# Patient Record
Sex: Female | Born: 1965 | Race: White | Hispanic: No | Marital: Single | Smoking: Never smoker
Health system: Southern US, Community
[De-identification: ages and names within clinical notes are randomized; demographics above are authoritative.]

## PROBLEM LIST (undated history)

## (undated) DIAGNOSIS — K9041 Non-celiac gluten sensitivity: Secondary | ICD-10-CM

## (undated) DIAGNOSIS — M1990 Unspecified osteoarthritis, unspecified site: Secondary | ICD-10-CM

## (undated) DIAGNOSIS — T7840XA Allergy, unspecified, initial encounter: Secondary | ICD-10-CM

## (undated) DIAGNOSIS — J302 Other seasonal allergic rhinitis: Secondary | ICD-10-CM

## (undated) DIAGNOSIS — F5104 Psychophysiologic insomnia: Secondary | ICD-10-CM

## (undated) DIAGNOSIS — M5126 Other intervertebral disc displacement, lumbar region: Secondary | ICD-10-CM

## (undated) DIAGNOSIS — F419 Anxiety disorder, unspecified: Secondary | ICD-10-CM

## (undated) DIAGNOSIS — K219 Gastro-esophageal reflux disease without esophagitis: Secondary | ICD-10-CM

## (undated) DIAGNOSIS — L910 Hypertrophic scar: Secondary | ICD-10-CM

## (undated) DIAGNOSIS — F329 Major depressive disorder, single episode, unspecified: Secondary | ICD-10-CM

## (undated) DIAGNOSIS — F32A Depression, unspecified: Secondary | ICD-10-CM

## (undated) DIAGNOSIS — S83519A Sprain of anterior cruciate ligament of unspecified knee, initial encounter: Secondary | ICD-10-CM

## (undated) DIAGNOSIS — J189 Pneumonia, unspecified organism: Secondary | ICD-10-CM

## (undated) DIAGNOSIS — G479 Sleep disorder, unspecified: Secondary | ICD-10-CM

## (undated) DIAGNOSIS — H409 Unspecified glaucoma: Secondary | ICD-10-CM

## (undated) HISTORY — DX: Sleep disorder, unspecified: G47.9

## (undated) HISTORY — PX: EYE SURGERY: SHX253

## (undated) HISTORY — DX: Unspecified osteoarthritis, unspecified site: M19.90

## (undated) HISTORY — DX: Other seasonal allergic rhinitis: J30.2

## (undated) HISTORY — DX: Non-celiac gluten sensitivity: K90.41

## (undated) HISTORY — DX: Psychophysiologic insomnia: F51.04

## (undated) HISTORY — DX: Allergy, unspecified, initial encounter: T78.40XA

## (undated) HISTORY — DX: Hypertrophic scar: L91.0

## (undated) HISTORY — DX: Other intervertebral disc displacement, lumbar region: M51.26

## (undated) HISTORY — DX: Depression, unspecified: F32.A

## (undated) HISTORY — DX: Gastro-esophageal reflux disease without esophagitis: K21.9

## (undated) HISTORY — DX: Unspecified glaucoma: H40.9

## (undated) HISTORY — DX: Anxiety disorder, unspecified: F41.9

## (undated) HISTORY — DX: Major depressive disorder, single episode, unspecified: F32.9

---

## 1993-04-26 HISTORY — PX: KNEE ARTHROSCOPY: SUR90

## 2000-12-02 ENCOUNTER — Other Ambulatory Visit: Admission: RE | Admit: 2000-12-02 | Discharge: 2000-12-02 | Payer: Self-pay | Admitting: Obstetrics and Gynecology

## 2001-12-04 ENCOUNTER — Other Ambulatory Visit: Admission: RE | Admit: 2001-12-04 | Discharge: 2001-12-04 | Payer: Self-pay | Admitting: Obstetrics and Gynecology

## 2002-01-15 ENCOUNTER — Encounter: Admission: RE | Admit: 2002-01-15 | Discharge: 2002-01-15 | Payer: Self-pay | Admitting: Obstetrics and Gynecology

## 2002-01-15 ENCOUNTER — Encounter: Payer: Self-pay | Admitting: Obstetrics and Gynecology

## 2003-02-27 ENCOUNTER — Other Ambulatory Visit: Admission: RE | Admit: 2003-02-27 | Discharge: 2003-02-27 | Payer: Self-pay | Admitting: Obstetrics and Gynecology

## 2004-02-18 ENCOUNTER — Encounter: Admission: RE | Admit: 2004-02-18 | Discharge: 2004-02-18 | Payer: Self-pay | Admitting: Gastroenterology

## 2004-03-24 ENCOUNTER — Encounter: Admission: RE | Admit: 2004-03-24 | Discharge: 2004-03-24 | Payer: Self-pay | Admitting: Gastroenterology

## 2004-06-29 ENCOUNTER — Ambulatory Visit (HOSPITAL_COMMUNITY): Admission: RE | Admit: 2004-06-29 | Discharge: 2004-06-29 | Payer: Self-pay | Admitting: Gastroenterology

## 2004-07-29 ENCOUNTER — Encounter (INDEPENDENT_AMBULATORY_CARE_PROVIDER_SITE_OTHER): Payer: Self-pay | Admitting: *Deleted

## 2004-07-29 ENCOUNTER — Ambulatory Visit (HOSPITAL_COMMUNITY): Admission: RE | Admit: 2004-07-29 | Discharge: 2004-07-29 | Payer: Self-pay | Admitting: Gastroenterology

## 2004-12-15 ENCOUNTER — Ambulatory Visit: Payer: Self-pay | Admitting: Internal Medicine

## 2006-02-23 ENCOUNTER — Encounter: Admission: RE | Admit: 2006-02-23 | Discharge: 2006-02-23 | Payer: Self-pay | Admitting: Obstetrics and Gynecology

## 2006-03-10 ENCOUNTER — Encounter: Admission: RE | Admit: 2006-03-10 | Discharge: 2006-03-10 | Payer: Self-pay | Admitting: Obstetrics and Gynecology

## 2006-04-26 HISTORY — PX: UPPER GASTROINTESTINAL ENDOSCOPY: SHX188

## 2006-06-27 ENCOUNTER — Ambulatory Visit: Payer: Self-pay | Admitting: Internal Medicine

## 2006-08-18 ENCOUNTER — Ambulatory Visit (HOSPITAL_COMMUNITY): Admission: RE | Admit: 2006-08-18 | Discharge: 2006-08-18 | Payer: Self-pay | Admitting: Gastroenterology

## 2006-08-18 ENCOUNTER — Encounter (INDEPENDENT_AMBULATORY_CARE_PROVIDER_SITE_OTHER): Payer: Self-pay | Admitting: Specialist

## 2006-08-29 ENCOUNTER — Ambulatory Visit (HOSPITAL_COMMUNITY): Admission: RE | Admit: 2006-08-29 | Discharge: 2006-08-29 | Payer: Self-pay | Admitting: Gastroenterology

## 2007-01-17 ENCOUNTER — Encounter: Admission: RE | Admit: 2007-01-17 | Discharge: 2007-01-17 | Payer: Self-pay | Admitting: Obstetrics and Gynecology

## 2007-04-27 HISTORY — PX: UPPER GASTROINTESTINAL ENDOSCOPY: SHX188

## 2007-06-01 ENCOUNTER — Telehealth (INDEPENDENT_AMBULATORY_CARE_PROVIDER_SITE_OTHER): Payer: Self-pay | Admitting: *Deleted

## 2007-06-06 ENCOUNTER — Encounter: Admission: RE | Admit: 2007-06-06 | Discharge: 2007-06-06 | Payer: Self-pay | Admitting: Gastroenterology

## 2007-06-13 ENCOUNTER — Telehealth (INDEPENDENT_AMBULATORY_CARE_PROVIDER_SITE_OTHER): Payer: Self-pay | Admitting: *Deleted

## 2007-07-03 ENCOUNTER — Encounter: Admission: RE | Admit: 2007-07-03 | Discharge: 2007-07-03 | Payer: Self-pay | Admitting: Obstetrics and Gynecology

## 2007-07-17 ENCOUNTER — Encounter: Admission: RE | Admit: 2007-07-17 | Discharge: 2007-07-17 | Payer: Self-pay | Admitting: Obstetrics and Gynecology

## 2007-08-28 ENCOUNTER — Telehealth (INDEPENDENT_AMBULATORY_CARE_PROVIDER_SITE_OTHER): Payer: Self-pay | Admitting: *Deleted

## 2007-08-29 DIAGNOSIS — H16299 Other keratoconjunctivitis, unspecified eye: Secondary | ICD-10-CM | POA: Insufficient documentation

## 2007-08-29 DIAGNOSIS — I059 Rheumatic mitral valve disease, unspecified: Secondary | ICD-10-CM | POA: Insufficient documentation

## 2007-08-29 DIAGNOSIS — F329 Major depressive disorder, single episode, unspecified: Secondary | ICD-10-CM | POA: Insufficient documentation

## 2007-08-30 ENCOUNTER — Encounter: Payer: Self-pay | Admitting: Internal Medicine

## 2007-10-09 ENCOUNTER — Ambulatory Visit: Payer: Self-pay | Admitting: Internal Medicine

## 2007-10-09 DIAGNOSIS — K319 Disease of stomach and duodenum, unspecified: Secondary | ICD-10-CM | POA: Insufficient documentation

## 2007-10-09 DIAGNOSIS — G479 Sleep disorder, unspecified: Secondary | ICD-10-CM | POA: Insufficient documentation

## 2007-10-09 DIAGNOSIS — F411 Generalized anxiety disorder: Secondary | ICD-10-CM | POA: Insufficient documentation

## 2007-10-10 LAB — CONVERTED CEMR LAB
Free T4: 0.8 ng/dL (ref 0.6–1.6)
TSH: 2.35 microintl units/mL (ref 0.35–5.50)

## 2007-11-02 ENCOUNTER — Encounter: Payer: Self-pay | Admitting: Internal Medicine

## 2007-11-15 ENCOUNTER — Encounter: Payer: Self-pay | Admitting: Internal Medicine

## 2008-02-09 ENCOUNTER — Telehealth (INDEPENDENT_AMBULATORY_CARE_PROVIDER_SITE_OTHER): Payer: Self-pay | Admitting: *Deleted

## 2008-02-19 ENCOUNTER — Encounter: Admission: RE | Admit: 2008-02-19 | Discharge: 2008-02-19 | Payer: Self-pay | Admitting: Obstetrics and Gynecology

## 2008-03-05 ENCOUNTER — Encounter: Payer: Self-pay | Admitting: Internal Medicine

## 2008-03-29 ENCOUNTER — Ambulatory Visit: Payer: Self-pay | Admitting: Internal Medicine

## 2008-04-08 ENCOUNTER — Encounter (INDEPENDENT_AMBULATORY_CARE_PROVIDER_SITE_OTHER): Payer: Self-pay | Admitting: *Deleted

## 2008-12-23 ENCOUNTER — Encounter: Admission: RE | Admit: 2008-12-23 | Discharge: 2008-12-23 | Payer: Self-pay | Admitting: Obstetrics and Gynecology

## 2009-04-04 ENCOUNTER — Ambulatory Visit: Payer: Self-pay | Admitting: Family

## 2009-04-04 DIAGNOSIS — R5381 Other malaise: Secondary | ICD-10-CM | POA: Insufficient documentation

## 2009-04-04 DIAGNOSIS — J302 Other seasonal allergic rhinitis: Secondary | ICD-10-CM | POA: Insufficient documentation

## 2009-04-04 DIAGNOSIS — J329 Chronic sinusitis, unspecified: Secondary | ICD-10-CM | POA: Insufficient documentation

## 2009-04-04 DIAGNOSIS — R5383 Other fatigue: Secondary | ICD-10-CM | POA: Insufficient documentation

## 2009-04-04 LAB — CONVERTED CEMR LAB
Basophils Absolute: 0 10*3/uL (ref 0.0–0.1)
Basophils Relative: 0.8 % (ref 0.0–3.0)
Eosinophils Absolute: 0.2 10*3/uL (ref 0.0–0.7)
Eosinophils Relative: 4.4 % (ref 0.0–5.0)
HCT: 43.9 % (ref 36.0–46.0)
Hemoglobin: 14.4 g/dL (ref 12.0–15.0)
Lymphocytes Relative: 24.4 % (ref 12.0–46.0)
Lymphs Abs: 1.4 10*3/uL (ref 0.7–4.0)
MCHC: 32.7 g/dL (ref 30.0–36.0)
MCV: 95.5 fL (ref 78.0–100.0)
Monocytes Absolute: 0.4 10*3/uL (ref 0.1–1.0)
Monocytes Relative: 6.8 % (ref 3.0–12.0)
Neutro Abs: 3.6 10*3/uL (ref 1.4–7.7)
Neutrophils Relative %: 63.6 % (ref 43.0–77.0)
Platelets: 188 10*3/uL (ref 150.0–400.0)
RBC: 4.6 M/uL (ref 3.87–5.11)
RDW: 12.3 % (ref 11.5–14.6)
TSH: 1.11 microintl units/mL (ref 0.35–5.50)
WBC: 5.6 10*3/uL (ref 4.5–10.5)

## 2009-04-07 ENCOUNTER — Encounter: Payer: Self-pay | Admitting: Family

## 2009-04-26 DIAGNOSIS — K9041 Non-celiac gluten sensitivity: Secondary | ICD-10-CM

## 2009-04-26 HISTORY — DX: Non-celiac gluten sensitivity: K90.41

## 2010-01-29 ENCOUNTER — Encounter: Admission: RE | Admit: 2010-01-29 | Discharge: 2010-01-29 | Payer: Self-pay | Admitting: Obstetrics and Gynecology

## 2010-05-17 ENCOUNTER — Encounter: Payer: Self-pay | Admitting: Obstetrics and Gynecology

## 2010-05-24 LAB — CONVERTED CEMR LAB
ALT: 19 units/L (ref 0–35)
AST: 25 units/L (ref 0–37)
Albumin: 4 g/dL (ref 3.5–5.2)
Alkaline Phosphatase: 44 units/L (ref 39–117)
BUN: 11 mg/dL (ref 6–23)
Basophils Absolute: 0 10*3/uL (ref 0.0–0.1)
Basophils Relative: 0.6 % (ref 0.0–3.0)
Bilirubin, Direct: 0.1 mg/dL (ref 0.0–0.3)
CO2: 30 meq/L (ref 19–32)
Calcium: 9 mg/dL (ref 8.4–10.5)
Chloride: 106 meq/L (ref 96–112)
Cholesterol: 179 mg/dL (ref 0–200)
Creatinine, Ser: 0.8 mg/dL (ref 0.4–1.2)
Eosinophils Absolute: 0.1 10*3/uL (ref 0.0–0.7)
Eosinophils Relative: 3.7 % (ref 0.0–5.0)
GFR calc Af Amer: 102 mL/min
GFR calc non Af Amer: 84 mL/min
Glucose, Bld: 82 mg/dL (ref 70–99)
HCT: 42.3 % (ref 36.0–46.0)
HDL: 70.6 mg/dL (ref >39.0)
Hemoglobin: 14.6 g/dL (ref 12.0–15.0)
LDL Cholesterol: 102 mg/dL — ABNORMAL HIGH (ref 0–99)
Lymphocytes Relative: 40.7 % (ref 12.0–46.0)
MCHC: 34.5 g/dL (ref 30.0–36.0)
MCV: 91 fL (ref 78.0–100.0)
Monocytes Absolute: 0.4 10*3/uL (ref 0.1–1.0)
Monocytes Relative: 9.4 % (ref 3.0–12.0)
Neutro Abs: 1.8 10*3/uL (ref 1.4–7.7)
Neutrophils Relative %: 45.6 % (ref 43.0–77.0)
Platelets: 190 10*3/uL (ref 150–400)
Potassium: 4.5 meq/L (ref 3.5–5.1)
RBC: 4.65 M/uL (ref 3.87–5.11)
RDW: 12 % (ref 11.5–14.6)
Sodium: 140 meq/L (ref 135–145)
TSH: 1.09 microintl units/mL (ref 0.35–5.50)
Total Bilirubin: 0.9 mg/dL (ref 0.3–1.2)
Total CHOL/HDL Ratio: 2.5
Total Protein: 6.8 g/dL (ref 6.0–8.3)
Triglycerides: 31 mg/dL (ref 0–149)
VLDL: 6 mg/dL (ref 0–40)
WBC: 3.8 10*3/uL — ABNORMAL LOW (ref 4.5–10.5)

## 2010-07-28 IMAGING — MG MM DIAGNOSTIC UNILATERAL L
2 series · 2 of 2 positions shown · non-contrast
Comparison: 02/23/2006, 01/17/2007 and 07/03/2007

CLINICAL DATA: The patient returns for short interval follow-up of
a possible fibroadenoma in the left breast noted on prior study
dated [DATE] and 07/17/2007.

DIGITAL DIAGNOSTIC  LEFT  MAMMOGRAM  WITH CAD AND LEFT BREAST
ULTRASOUND:

[L CC]
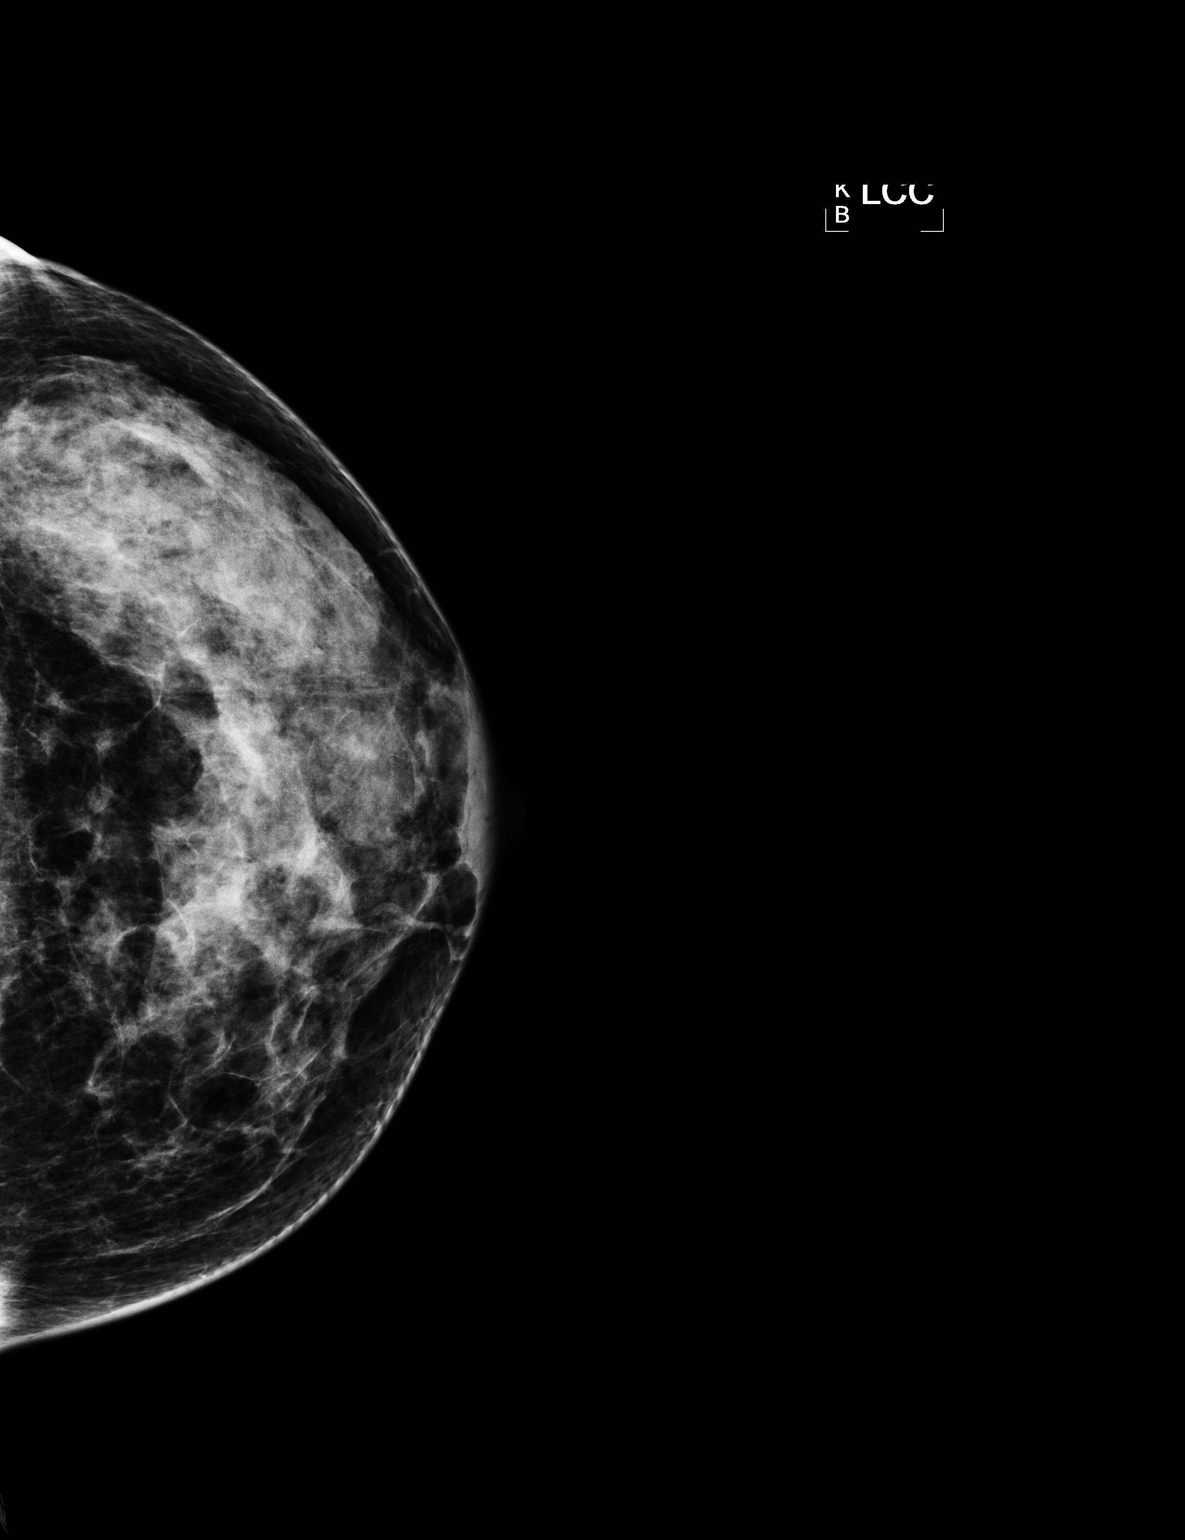

[L MLO]
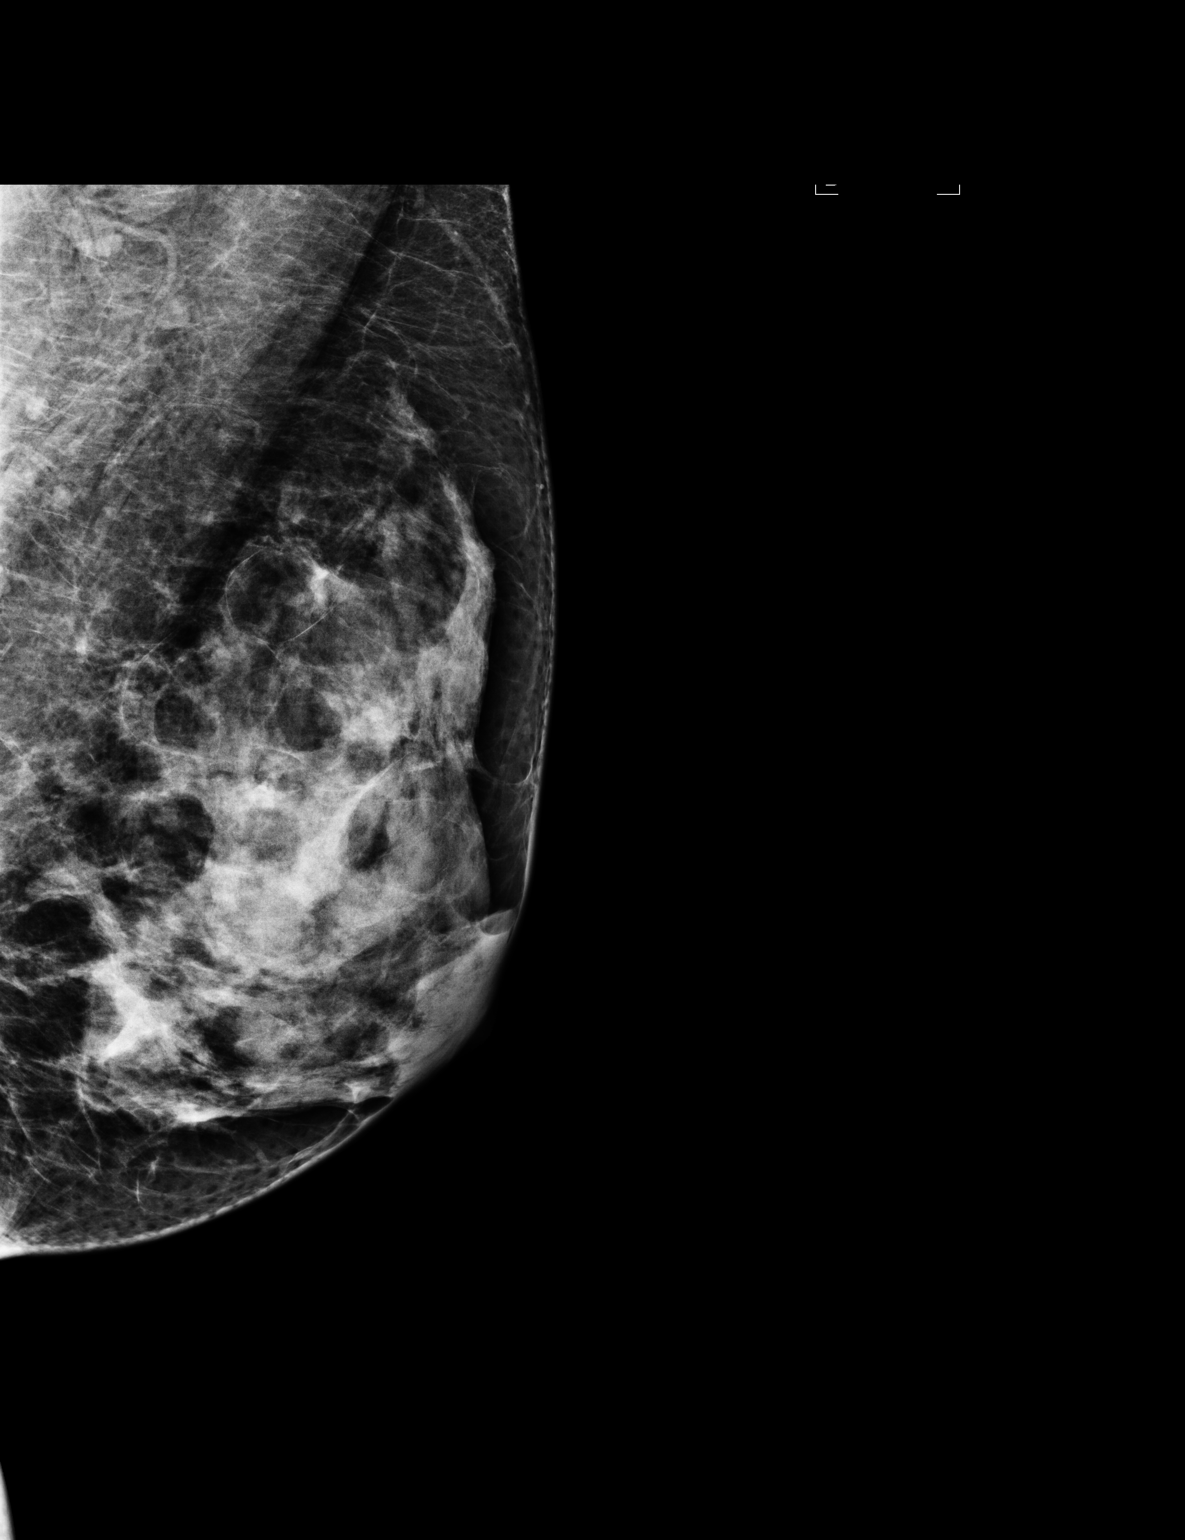

[2 of 2 positions shown; findings below may reference images not displayed]

FINDINGS: The breast tissue is heterogeneously dense.  There is no
dominant mass, architectural distortion or calcification to suggest
malignancy.  No persistent mass is noted in the left upper inner
quadrant.

On physical exam, no mass is palpated in the left upper inner
quadrant.

Ultrasound is performed, showing mixed fibroglandular tissue and
fat with no mass, distortion or shadowing to suggest malignancy.
The area of felt to represent a fibroadenoma prior study is felt to
be a fatty lobule on today's exam.
IMPRESSION: No mammographic or sonographic evidence malignancy.  Yearly
screening mammography is suggested with next scheduled exam in
June 2008.

BI-RADS CATEGORY 1:  Negative.

## 2010-09-11 NOTE — Op Note (Signed)
NAME:  Susan Fisher, Susan Fisher                  ACCOUNT NO.:  [id number]   MEDICAL RECORD NO.:  [id number]          PATIENT TYPE:  AMB   LOCATION:  ENDO                         FACILITY:  [facility]   PHYSICIAN:  Petra Kuba, M.D.    DATE OF BIRTH:  [birth year]   DATE OF PROCEDURE:  08/18/2006  DATE OF DISCHARGE:  08/18/2006                               OPERATIVE REPORT   HISTORY:  The patient had a Bravo capsule placed at the time of her  endoscopy in the customary fashion, and the computerized generated  report as well as her symptom index was given to me for my  interpretation.   ASSESSMENT:  Her DeMeester scores were markedly elevated, both on day 1,  day 2 and total, all in the 30s, normal being 14.7 or less.  She seemed  to reflux more in the supine than the upright on day 1, but  interestingly on day 2 she had more upright than supine.  She belched  frequently throughout the procedure but that did not coordinate with her  reflux.  On a few meals, she did seem to reflux particularly afterwards  but not on everyone.  Her heartburn did seem to coordinate a little more  with reflux than did her belching.   ASSESSMENT:  Obvious reflux disease although were hard to coordinate all  symptoms.   PLAN:  Will maximize her reflux medicine, see back in a month or two and  then decide further workup plans or even surgical options, although my  fear would be her belching and bloating would continue if she was unable  to release her gas based on her frequency of the belching and if her GE  junction was tightened possibly would bother her belly more.           ______________________________  Petra Kuba, M.D.     MEM/MEDQ  D:  08/31/2006  T:  08/31/2006  Job:  [id number]

## 2010-09-11 NOTE — Op Note (Signed)
Susan Fisher, Susan Fisher                  ACCOUNT NO.:  [id number]   MEDICAL RECORD NO.:  [id number]          PATIENT TYPE:  AMB   LOCATION:  ENDO                         FACILITY:  [facility]   PHYSICIAN:  Petra Kuba, M.D.    DATE OF BIRTH:  [birth year]   DATE OF PROCEDURE:  07/29/2004  DATE OF DISCHARGE:                                 OPERATIVE REPORT   PROCEDURES:  Esophagogastroduodenoscopy with biopsy.   INDICATIONS:  Increased belching not responding to the usual medicines.  Consent was signed after the risks, benefits, methods and options were  thoroughly discussed in the office.   MEDICINES USED:  1.  Demerol 50 mg.  2.  Versed 5 mg.   DESCRIPTION OF PROCEDURE:  The video endoscope was inserted by direct  vision.  The esophagus was normal.  She did seem to have a widely patent GE  junction and a possible tiny hiatal hernia.  The scope was passed into the  stomach and advanced to the antrum where she mild antritis and maybe one  small erosion was seen.  The scope was passed through a normal pylorus into  a normal duodenal bulb and around the celiac to a normal second portion of  the duodenum.  The scope was withdrawn back to the bulb and a good look  there ruled out ulcers in that location.  The scope was withdrawn back to  the stomach and retroflexed.  The cardia, fundus, angularis and lesser and  greater curves were normal on retroflexed visualization.  Straight  visualization confirmed the antritis and ruled out any additional findings.  I went ahead and took a few biopsies of the antrum and two of the proximal  stomach to rule out Helicobacter.  The air was suctioned.  The scope was  slowly withdrawn.  Again a good look at the esophagus was normal.  A few  biopsies of the distal and mid esophagus were obtained to rule out any  microscopic reflux changes.  The scope was removed.  The patient tolerated  the procedure well.  There was no evidence of immediate complication.   ENDOSCOPIC DIAGNOSES:  1.  Patent gastroesophageal junction and questionable tiny hiatal hernia,      status post esophageal biopsy to rule out microscopic reflux changes.  2.  Mild antritis and rare antral erosions, status post biopsy.  3.  Otherwise normal esophagogastroduodenoscopy.   PLAN:  Await pathology.  Possibly try her on Reglan if she does not have  Helicobacter.  Further workup and plans at that juncture.      MEM/MEDQ  D:  07/29/2004  T:  07/29/2004  Job:  [id number]

## 2010-09-11 NOTE — Op Note (Signed)
NAMEWILNA, PENNIE                  ACCOUNT NO.:  [id number]   MEDICAL RECORD NO.:  [id number]          PATIENT TYPE:  AMB   LOCATION:  ENDO                         [facility]ACILITY:  [facility]   PHYSICIAN:  Petra Kuba, M.D.    DATE OF BIRTH:  10/07/1965   DATE OF PROCEDURE:  09/05/2006  DATE OF DISCHARGE:  08/29/2006                               OPERATIVE REPORT   PROCEDURE:  Manometry.   INDICATION:  Patient with reflux want to see if surgical candidate.  Consent was signed after risks, benefits, methods, options thoroughly  discussed in the office.   PROCEDURE:  The manometry was done by the endoscopy nurse in the  customary fashion and the computer generated report given to me for my  interpretation.   ASSESSMENT:  1. Upper esophageal sphincter had normal pressures, normal duration      and normal residual pressure she did have a slightly increased peak      pressure of 195 with normal being up to 190 and the pharyngeal      area.  2. Esophageal body.      a.     Upper had normal amplitude, duration and wave.      b.     Lower had normal amplitudes, waveform, velocity, and 100%       peristalsis except for one midamplitude slight increase in       pressure 187 versus normal of 180.  3. Lower esophageal sphincter had a much increased pressure which was      99 normal being up to 45.  Her percent relaxation was 89% and had      normal residual pressures.   ASSESSMENT:  Increased lower esophageal sphincter pressure otherwise  essentially normal manometry.   PLANS:  Would not offer surgery at this time secondary to the increased  pressure, however, might need to recheck the LES pressure in the future  and will see back in one to two months to recheck symptoms and decide  any other workup and plans.           ______________________________  Petra Kuba, M.D.     MEM/MEDQ  D:  09/05/2006  T:  09/05/2006  Job:  [id number]

## 2010-09-11 NOTE — Op Note (Signed)
Susan Fisher, Susan Fisher                  ACCOUNT NO.:  [id number]   MEDICAL RECORD NO.:  [id number]          PATIENT TYPE:  AMB   LOCATION:  ENDO                         FACILITY:  [facility]   PHYSICIAN:  Petra Kuba, M.D.    DATE OF BIRTH:  [birth year]   DATE OF PROCEDURE:  08/18/2006  DATE OF DISCHARGE:                               OPERATIVE REPORT   PROCEDURE:  Esophagogastroduodenoscopy with biopsy and Bravo placement.  Consent was signed after risks, benefits, methods, and options were  thoroughly discussed in the office.   INDICATIONS:  Reflux and multiple GI symptoms not responding to the  usual medicines.   MEDICINES USED:  Fentanyl 100 mcg, Versed 10 mg.   DESCRIPTION OF PROCEDURE:  The video endoscope was inserted by direct  vision.  The esophagus was normal.  Possibly she had a tiny hiatal  hernia versus just loose LES.  The scope was passed into the stomach and  advanced through a normal antrum, normal pylorus, and advanced into a  normal duodenal bulb and around the C-loop to a normal second portion of  the duodenum. Because of gas and bloating, we went ahead and took two  duodenal biopsies.  The scope was withdrawn back to the bulb and a good  look there ruled out abnormalities in that location.  The scope was  withdrawn back to the stomach and retroflexed.  The cardia, fundus,  angularis, lesser and greater curve were all normal on retroflex  visualization.  Straight visualization of the stomach did not reveal any  additional findings.  We went ahead and took two biopsies of the antrum  and two of the proximal stomach to rule out any microscopic abnormality.  The scope was then withdrawn back to the upper esophageal sphincter,  again confirming the normal esophagus.  The scope was then advanced to  the GE junction which was measured at 38 cm.  The tape was placed on the  Bravo introducer at 32 cm as the scope was slowly withdrawn.  The Bravo  capsule was inserted in the  customary fashion, advanced to the tape, and  suction was applied for 45 seconds. Then, the release mechanism was  applied in the customary fashion.  The introducer was removed.  The  scope was then reinserted which confirmed the Bravo capsule in the  proper location at roughly 31 cm.  The scope was removed.  The patient  tolerated the procedure well.  There was no obvious immediate  complication.   ENDOSCOPIC DIAGNOSES:  1. Questionable tiny hiatal hernia.  2. Otherwise, normal EGD status post duodenal and stomach biopsies to      rule out microscopic abnormality.  3. Bravo capsule placed at 31 cm in the customary fashion.   PLAN:  Await pathology and Bravo results and then decide follow up,  other medicine options, etc., call me sooner p.r.n.           ______________________________  Petra Kuba, M.D.     MEM/MEDQ  D:  08/18/2006  T:  08/18/2006  Job:  [id number]

## 2010-12-22 ENCOUNTER — Other Ambulatory Visit: Payer: Self-pay | Admitting: Physician Assistant

## 2011-09-14 ENCOUNTER — Other Ambulatory Visit: Payer: Self-pay | Admitting: Obstetrics and Gynecology

## 2011-09-14 DIAGNOSIS — Z1231 Encounter for screening mammogram for malignant neoplasm of breast: Secondary | ICD-10-CM

## 2011-09-28 ENCOUNTER — Ambulatory Visit
Admission: RE | Admit: 2011-09-28 | Discharge: 2011-09-28 | Disposition: A | Discharge status: Home / Self Care | Payer: Private Health Insurance - Indemnity | Source: Ambulatory Visit | Attending: Obstetrics and Gynecology | Admitting: Obstetrics and Gynecology

## 2011-09-28 DIAGNOSIS — Z1231 Encounter for screening mammogram for malignant neoplasm of breast: Secondary | ICD-10-CM

## 2011-12-28 ENCOUNTER — Telehealth: Payer: Self-pay | Admitting: Internal Medicine

## 2011-12-28 NOTE — Telephone Encounter (Signed)
The pt called and is hoping to get labs done before her 2:30pm cpe with Dr.Hopper.  Can she do this? Or does she have to wait?   Thanks!

## 2011-12-28 NOTE — Telephone Encounter (Signed)
Called pt, she understands. Thanks!

## 2011-12-28 NOTE — Telephone Encounter (Signed)
Patient not seen since 2009 by Dr.Hopper, she will need to wait.

## 2011-12-28 NOTE — Telephone Encounter (Signed)
Noted  

## 2012-01-03 ENCOUNTER — Encounter: Payer: Self-pay | Admitting: Internal Medicine

## 2012-01-03 ENCOUNTER — Ambulatory Visit (INDEPENDENT_AMBULATORY_CARE_PROVIDER_SITE_OTHER): Payer: Medicare HMO | Admitting: Internal Medicine

## 2012-01-03 VITALS — BP 110/70 | HR 75 | Temp 98.1°F | Resp 12 | Ht 58.03 in | Wt 106.4 lb

## 2012-01-03 DIAGNOSIS — J302 Other seasonal allergic rhinitis: Secondary | ICD-10-CM

## 2012-01-03 DIAGNOSIS — Z0000 Encounter for general adult medical examination without abnormal findings: Secondary | ICD-10-CM

## 2012-01-03 DIAGNOSIS — J309 Allergic rhinitis, unspecified: Secondary | ICD-10-CM

## 2012-01-03 NOTE — Patient Instructions (Addendum)
Preventive Health Care: Exercise  30-45  minutes a day, 3-4 days a week. Walking is especially valuable in preventing Osteoporosis. Eat a low-fat diet with lots of fruits and vegetables, up to 7-9 servings per day. To prevent palpitations or premature beats, avoid stimulants such as decongestants, diet pills, or caffeine (coffee, tea, cola, or chocolate) to excess.  Consume less than 30 grams  of sugar per day from foods & drinks with High Fructose Corn Syrup as #2,3 or #4 on label. Please  schedule fasting Labs : BMET,Lipids, hepatic panel, CBC & dif, TSH. Codes: V70.0.   If you activate My Chart; the results can be released to you as soon as they populate from the lab. If you choose not to use this program; the labs have to be reviewed, copied & mailed   causing a delay in getting the results to you.

## 2012-01-03 NOTE — Progress Notes (Signed)
  Subjective:    Patient ID: Susan Fisher, female    DOB: 08/23/1965, 46 y.o.   MRN: [id number]   HPI  Susan Fisher is here for a physical; she denies acute issues.      Review of Systems Her chronic insomnia is well controlled for the most part with the present regimen from Dr. Marylou Flesher. Occasionally she gets anxious about being able to sleep and will have some insomnia.  Her seasonal allergies are well controlled with Claritin on an as-needed basis.  She previously had intractable burping; endoscopy was negative x2. Apparently biopsies were made of the small bowel at [facility] which did not reveal sprue. With gluten avoidance this problem has resolved completely.     Objective:   Physical Exam Gen.: Thin but healthy and well-nourished in appearance. Alert, appropriate and cooperative throughout exam. Head: Normocephalic without obvious abnormalities  Eyes: No corneal or conjunctival inflammation noted. Pupils equal round reactive to light and accommodation. Fundal exam is benign without hemorrhages, exudate, papilledema. Extraocular motion intact. Vision grossly normal with lenses. Minimal lid lag.Minimal vertical nystagmus Ears: External  ear exam reveals no significant lesions or deformities. Canals clear .TMs normal. Hearing is grossly normal bilaterally. Nose: External nasal exam reveals no deformity or inflammation. Nasal mucosa are pink and moist. No lesions or exudates noted.   Mouth: Oral mucosa and oropharynx reveal no lesions or exudates. Teeth in good repair. Neck: No deformities, masses, or tenderness noted. Range of motion & Thyroid normal. Lungs: Normal respiratory effort; chest expands symmetrically. Lungs are clear to auscultation without rales, wheezes, or increased work of breathing. Heart: Normal rate and rhythm. Normal S1 and S2. No gallop, click, or rub. S4 with Grade 1/ 6 systolic murmur. Abdomen: Bowel sounds normal; abdomen soft and nontender. No masses, organomegaly or  hernias noted. Genitalia: Dr Cherly Hensen, Gyn Musculoskeletal/extremities: No deformity or scoliosis noted of  the thoracic or lumbar spine. No clubbing, cyanosis, edema, or deformity noted. Range of motion  normal .Tone & strength  normal.Joints normal. Nail health  good. Vascular: Carotid, radial artery, dorsalis pedis and  posterior tibial pulses are full and equal. No bruits present. Neurologic: Alert and oriented x3. Deep tendon reflexes symmetrical and normal.          Skin: Intact without suspicious lesions or rashes. Lymph: No cervical, axillary lymphadenopathy present. Psych: Mood and affect are normal. Normally interactive                                                                                         Assessment & Plan:  #1 comprehensive physical exam; no acute findings #2 see Problem List with Assessments & Recommendations Plan: see Orders

## 2012-01-13 ENCOUNTER — Other Ambulatory Visit: Payer: Medicare HMO

## 2012-01-25 ENCOUNTER — Other Ambulatory Visit: Payer: Medicare HMO

## 2012-02-01 ENCOUNTER — Other Ambulatory Visit (INDEPENDENT_AMBULATORY_CARE_PROVIDER_SITE_OTHER): Payer: Medicare HMO

## 2012-02-01 DIAGNOSIS — Z0000 Encounter for general adult medical examination without abnormal findings: Secondary | ICD-10-CM

## 2012-02-01 DIAGNOSIS — Z79899 Other long term (current) drug therapy: Secondary | ICD-10-CM

## 2012-02-01 LAB — HEPATIC FUNCTION PANEL
AST: 17 U/L (ref 0–37)
Alkaline Phosphatase: 40 U/L (ref 39–117)
Bilirubin, Direct: 0.1 mg/dL (ref 0.0–0.3)

## 2012-02-01 LAB — BASIC METABOLIC PANEL
BUN: 10 mg/dL (ref 6–23)
CO2: 26 mEq/L (ref 19–32)
Calcium: 8.5 mg/dL (ref 8.4–10.5)
Chloride: 106 mEq/L (ref 96–112)
Creatinine, Ser: 0.8 mg/dL (ref 0.4–1.2)

## 2012-02-01 LAB — CBC WITH DIFFERENTIAL/PLATELET
Basophils Absolute: 0 10*3/uL (ref 0.0–0.1)
Basophils Relative: 1 % (ref 0.0–3.0)
Eosinophils Absolute: 0.1 10*3/uL (ref 0.0–0.7)
Hemoglobin: 14 g/dL (ref 12.0–15.0)
Lymphs Abs: 1.5 10*3/uL (ref 0.7–4.0)
MCHC: 32.6 g/dL (ref 30.0–36.0)
MCV: 91.9 fl (ref 78.0–100.0)
Monocytes Absolute: 0.4 10*3/uL (ref 0.1–1.0)
Neutro Abs: 2 10*3/uL (ref 1.4–7.7)
RBC: 4.66 Mil/uL (ref 3.87–5.11)
RDW: 12.2 % (ref 11.5–14.6)

## 2012-02-01 LAB — LIPID PANEL
Cholesterol: 150 mg/dL (ref 0–200)
LDL Cholesterol: 78 mg/dL (ref 0–99)
Total CHOL/HDL Ratio: 2

## 2012-02-14 ENCOUNTER — Telehealth: Payer: Self-pay

## 2012-02-14 NOTE — Telephone Encounter (Signed)
Spoke with patient, form to be faxed

## 2012-02-14 NOTE — Telephone Encounter (Signed)
Pt states never was called to pick up form for employment verifying physical and lab work was done. Pt states received lab results only.  Plz advise    MW

## 2012-07-13 ENCOUNTER — Other Ambulatory Visit: Payer: Self-pay

## 2012-07-13 MED ORDER — AMPHETAMINE-DEXTROAMPHET ER 10 MG PO CP24: 10.0000 mg | ORAL_CAPSULE | Freq: Every day | ORAL | Status: DC

## 2012-07-13 NOTE — Telephone Encounter (Signed)
Dr Vickey Huger is out of the office.  Dr Anne Hahn [facility]

## 2012-09-27 ENCOUNTER — Other Ambulatory Visit: Payer: Self-pay

## 2012-09-27 MED ORDER — AMPHETAMINE-DEXTROAMPHET ER 10 MG PO CP24: 10.0000 mg | ORAL_CAPSULE | Freq: Every day | ORAL | Status: DC

## 2012-09-27 NOTE — Telephone Encounter (Signed)
Patient requesting refill.  Call back number [phone]

## 2012-12-13 ENCOUNTER — Encounter (HOSPITAL_COMMUNITY): Payer: Self-pay | Admitting: *Deleted

## 2012-12-13 ENCOUNTER — Emergency Department (HOSPITAL_COMMUNITY)
Admission: EM | Admit: 2012-12-13 | Discharge: 2012-12-14 | Disposition: A | Discharge status: Home / Self Care | Payer: Medicare HMO | Attending: Emergency Medicine | Admitting: Emergency Medicine

## 2012-12-13 DIAGNOSIS — Z3202 Encounter for pregnancy test, result negative: Secondary | ICD-10-CM | POA: Insufficient documentation

## 2012-12-13 DIAGNOSIS — G479 Sleep disorder, unspecified: Secondary | ICD-10-CM | POA: Insufficient documentation

## 2012-12-13 DIAGNOSIS — Z8719 Personal history of other diseases of the digestive system: Secondary | ICD-10-CM | POA: Insufficient documentation

## 2012-12-13 DIAGNOSIS — N898 Other specified noninflammatory disorders of vagina: Secondary | ICD-10-CM

## 2012-12-13 DIAGNOSIS — R109 Unspecified abdominal pain: Secondary | ICD-10-CM

## 2012-12-13 DIAGNOSIS — Z9889 Other specified postprocedural states: Secondary | ICD-10-CM | POA: Insufficient documentation

## 2012-12-13 DIAGNOSIS — R1031 Right lower quadrant pain: Secondary | ICD-10-CM | POA: Insufficient documentation

## 2012-12-13 NOTE — ED Notes (Signed)
Pt states that she began to have abd pain that began around 6pm; pt states that the pain is a 5-6 all the time with intermittent waves of increased pain; pt denies N/V/D

## 2012-12-14 ENCOUNTER — Emergency Department (HOSPITAL_COMMUNITY): Payer: Medicare HMO

## 2012-12-14 LAB — BASIC METABOLIC PANEL
BUN: 14 mg/dL (ref 6–23)
Calcium: 8.5 mg/dL (ref 8.4–10.5)
Creatinine, Ser: 0.81 mg/dL (ref 0.50–1.10)
GFR calc Af Amer: >90 mL/min (ref >90)
GFR calc non Af Amer: 86 mL/min — ABNORMAL LOW (ref >90)
Potassium: 3.6 mEq/L (ref 3.5–5.1)

## 2012-12-14 LAB — CBC WITH DIFFERENTIAL/PLATELET
Basophils Relative: 0 % (ref 0–1)
Eosinophils Absolute: 0.2 10*3/uL (ref 0.0–0.7)
Eosinophils Relative: 2 % (ref 0–5)
Hemoglobin: 13.7 g/dL (ref 12.0–15.0)
MCH: 30 pg (ref 26.0–34.0)
MCHC: 33.7 g/dL (ref 30.0–36.0)
MCV: 89.1 fL (ref 78.0–100.0)
Monocytes Relative: 8 % (ref 3–12)
Neutrophils Relative %: 69 % (ref 43–77)
Platelets: 206 10*3/uL (ref 150–400)

## 2012-12-14 LAB — URINE MICROSCOPIC-ADD ON

## 2012-12-14 LAB — URINALYSIS, ROUTINE W REFLEX MICROSCOPIC
Nitrite: NEGATIVE
Specific Gravity, Urine: 1.015 (ref 1.005–1.030)
pH: 8 (ref 5.0–8.0)

## 2012-12-14 LAB — GC/CHLAMYDIA PROBE AMP: GC Probe RNA: NEGATIVE

## 2012-12-14 MED ORDER — CEFTRIAXONE SODIUM 250 MG IJ SOLR
250.0000 mg | Freq: Once | INTRAMUSCULAR | Status: AC
  Administered 2012-12-14: 250 mg via INTRAMUSCULAR
  Filled 2012-12-14: qty 250

## 2012-12-14 MED ORDER — AZITHROMYCIN 250 MG PO TABS
1000.0000 mg | ORAL_TABLET | Freq: Once | ORAL | Status: AC
  Administered 2012-12-14: 1000 mg via ORAL
  Filled 2012-12-14: qty 4

## 2012-12-14 MED ORDER — IOHEXOL 300 MG/ML  SOLN
50.0000 mL | Freq: Once | INTRAMUSCULAR | Status: AC | PRN
  Administered 2012-12-14: 50 mL via ORAL

## 2012-12-14 MED ORDER — HYDROCODONE-ACETAMINOPHEN 5-325 MG PO TABS: 1.0000 | ORAL_TABLET | ORAL | Status: DC | PRN

## 2012-12-14 MED ORDER — IOHEXOL 300 MG/ML  SOLN
100.0000 mL | Freq: Once | INTRAMUSCULAR | Status: AC | PRN
  Administered 2012-12-14: 100 mL via INTRAVENOUS

## 2012-12-14 NOTE — ED Provider Notes (Signed)
Medical screening examination/treatment/procedure(s) were performed by non-physician practitioner and as supervising physician I was immediately available for consultation/collaboration.  Danelia Snodgrass, MD 12/14/12 0556 

## 2012-12-14 NOTE — ED Provider Notes (Signed)
CSN: [id number]     Arrival date & time 12/13/12  2338 History     First MD Initiated Contact with Patient 12/14/12 0044     Chief Complaint  Patient presents with  . Abdominal Pain   (Consider location/radiation/quality/duration/timing/severity/associated sxs/prior Treatment) HPI History provided by pt.   Pt presents w/ intermittent RLQ pain since 5-6pm yesterday evening.  Pain severe, sharp and crampy, lasts for at a time, and then returns almost immediately.  Occasionally radiates to R flank.  Feels similar to menstrual cramps but stronger.  Currently improved.  No associated fever, N/V/D, hematochezia/melena, urinary or vaginal sx.  No h/o abdominal surgeries.  Past Medical History  Diagnosis Date  . Sleep disorder     Dr Marylou Flesher  . Gluten intolerance 2011    Dr Harmon Pier   Past Surgical History  Procedure Laterality Date  . Upper gastrointestinal endoscopy  2008    neg; Dr Ewing Schlein  . Upper gastrointestinal endoscopy  2009     [facility]  . Knee arthroscopy  1995   Family History  Problem Relation Age of Onset  . Anxiety disorder Mother   . Depression Mother   . Hypertension Mother   . Stroke Mother 14  . Heart failure Mother   . Heart attack Mother 61  . Breast cancer Mother 67  . COPD Mother     smoker  . Heart attack Paternal Grandfather 72  . Pulmonary embolism Father     post rotator cuff surgery   History  Substance Use Topics  . Smoking status: Never Smoker   . Smokeless tobacco: Not on file  . Alcohol Use: 3.0 oz/week    5 Glasses of wine per week     Comment:  socially   OB History   Grav Para Term Preterm Abortions TAB SAB Ect Mult Living                 Review of Systems  All other systems reviewed and are negative.    Allergies  Gluten meal and Venlafaxine  Home Medications   Current Outpatient Rx  Name  Route  Sig  Dispense  Refill  . ALPRAZolam (XANAX) 0.5 MG tablet   Oral   Take 0.25 mg by mouth at bedtime as needed for  sleep.         Marland Kitchen amphetamine-dextroamphetamine (ADDERALL XR) 10 MG 24 hr capsule   Oral   Take 1 capsule (10 mg total) by mouth daily.   90 capsule   0   . ibuprofen (ADVIL,MOTRIN) 200 MG tablet   Oral   Take 200 mg by mouth every 8 (eight) hours as needed for pain.         Marland Kitchen QUEtiapine (SEROQUEL) 25 MG tablet   Oral   Take 25 mg by mouth at bedtime.         . Simethicone (GAS-X PO)   Oral   Take 2 tablets by mouth once.          BP 153/87  Pulse 77  Temp(Src) 98.3 F (36.8 C) (Oral)  Resp 18  Ht 4\' 10"  (1.473 m)  Wt 110 lb (49.896 kg)  BMI 23 kg/m2  SpO2 100%  LMP 11/20/2012 Physical Exam  Nursing note and vitals reviewed. Constitutional: She is oriented to person, place, and time. She appears well-developed and well-nourished. No distress.  HENT:  Head: Normocephalic and atraumatic.  Eyes:  Normal appearance  Neck: Normal range of motion.  Cardiovascular: Normal rate  and regular rhythm.   Pulmonary/Chest: Effort normal and breath sounds normal. No respiratory distress.  Abdominal: Soft. Bowel sounds are normal. She exhibits no distension and no mass. There is no rebound and no guarding.  Mild RLQ ttp only   Genitourinary:  No CVA tenderness.  Nml external genitalia.  Thick, yellow mucousy vaginal discharge.  Cervix erythematous and there is what appears to be a 1-3mm shallow, darkly-colored ulceration.  No adnexal or cervical motion tenderess.    Musculoskeletal: Normal range of motion.  Neurological: She is alert and oriented to person, place, and time.  Skin: Skin is warm and dry. No rash noted.  Psychiatric: She has a normal mood and affect. Her behavior is normal.    ED Course   Procedures (including critical care time)  Labs Reviewed  WET PREP, GENITAL - Abnormal; Notable for the following:    WBC, Wet Prep HPF POC TOO NUMEROUS TO COUNT (*)    All other components within normal limits  URINALYSIS, ROUTINE W REFLEX MICROSCOPIC - Abnormal;  Notable for the following:    Leukocytes, UA TRACE (*)    All other components within normal limits  BASIC METABOLIC PANEL - Abnormal; Notable for the following:    Glucose, Bld 100 (*)    GFR calc non Af Amer 86 (*)    All other components within normal limits  GC/CHLAMYDIA PROBE AMP  URINE MICROSCOPIC-ADD ON  CBC WITH DIFFERENTIAL  POCT PREGNANCY, URINE   Ct Abdomen Pelvis W Contrast  12/14/2012   *RADIOLOGY REPORT*  Clinical Data: Abdominal pain.  White cells in the urine.  White cell count 7.1.  CT ABDOMEN AND PELVIS WITH CONTRAST  Technique:  Multidetector CT imaging of the abdomen and pelvis was performed following the standard protocol during bolus administration of intravenous contrast.  Contrast: OMNIPAQUE IOHEXOL 300 MG/ML  SOLN  Comparison: None.  Findings: Mild dependent atelectasis in the lung bases.  The liver, spleen, gallbladder, pancreas, adrenal glands, kidneys, abdominal aorta, inferior vena cava, and retroperitoneal lymph nodes are unremarkable.  The stomach and small bowel appear normal. Contrast material flows to the colon without evidence of obstruction.  Stool filled colon without distension.  No free air or free fluid in the abdomen.  Abdominal wall musculature appears intact.  Pelvis:  A the uterus and ovaries are not enlarged.  There appears to be involuting cyst in the right ovary with small amount of free fluid in the pelvis and around the right adnexa.  This is likely physiologic fluid.  The appendix is normal.  No evidence of diverticulitis.  No significant pelvic lymphadenopathy.  Normal alignment of the lumbar spine.  No destructive bone lesions appreciated.  IMPRESSION: Probable involuting follicle in the right ovary with small amount of free fluid in the pelvis, likely physiologic.  Normal appendix. No acute inflammatory process suggested in the abdomen or pelvis.   Original Report Authenticated By: Burman Nieves, M.D.   1. Abdominal pain   2. Vaginal  discharge     MDM  47yo healthy F presents w/ intermittent, severe, RLQ pain w/out associated sx since yesterday evening.  Pain minimal currently.  On exam, afebrile, NAD, abd soft/non-distended, mild RLQ ttp, thick, yellow, vaginal discharge and erythematous cervix w/ 1-62mm shallow ulceration.  Location of pain concerning for appendicitis though sx are otherwise not consistent.   No hematuria to suggest kidney stone.  No adnexal tenderness to suggest TOA/torsion.  Discussed case w/ Dr. Dierdre Highman and he recommends CT abd/pelvis w/  contrast.  Pt ok with this plan.  She will receive zithromax and rocephin for possible cervicitis. 2:54 AM   CT non-acute.  Results discussed w/ pt.  Her pain continues to be minimal and repeat exam unchanged.  D/c'd home w/ vicodin.  Recommended f/u with gynecologist and then her PCP next week if sx are persistent.  Return precautions discussed. 5:04 AM   Otilio Miu, PA-C 12/14/12 [phone]

## 2012-12-19 ENCOUNTER — Other Ambulatory Visit: Payer: Self-pay | Admitting: Neurology

## 2013-02-06 ENCOUNTER — Telehealth: Payer: Self-pay | Admitting: Neurology

## 2013-02-07 ENCOUNTER — Other Ambulatory Visit: Payer: Self-pay

## 2013-02-07 MED ORDER — AMPHETAMINE-DEXTROAMPHET ER 10 MG PO CP24: 10.0000 mg | ORAL_CAPSULE | Freq: Every day | ORAL | Status: DC

## 2013-02-07 NOTE — Telephone Encounter (Signed)
Patient has scheduled an appt.  Rx request was sent to the provider.

## 2013-02-07 NOTE — Telephone Encounter (Signed)
Patient called requesting a refill on Adderall.  She has scheduled an appt in March 2015.  She would like to pick up the Rx when it's ready.  Call back number [phone].

## 2013-02-08 NOTE — Telephone Encounter (Signed)
Rx signed, ready for pick up.  I called the patient.  She is aware.

## 2013-03-01 ENCOUNTER — Other Ambulatory Visit: Payer: Self-pay

## 2013-03-19 ENCOUNTER — Other Ambulatory Visit: Payer: Self-pay | Admitting: Neurology

## 2013-06-18 ENCOUNTER — Other Ambulatory Visit: Payer: Self-pay | Admitting: Neurology

## 2013-06-18 MED ORDER — AMPHETAMINE-DEXTROAMPHET ER 10 MG PO CP24: 10.0000 mg | ORAL_CAPSULE | Freq: Every day | ORAL | Status: DC

## 2013-06-18 NOTE — Telephone Encounter (Signed)
Called patient to inform her that her Rx was ready to be picked up at the front desk and if she has any other problems, questions or concerns to call the office. Patient verbalized understanding. °

## 2013-06-18 NOTE — Telephone Encounter (Signed)
Patient needs written Rx for Amphetamine Salt--please call when ready

## 2013-07-04 ENCOUNTER — Ambulatory Visit: Payer: Self-pay | Admitting: Neurology

## 2013-08-24 HISTORY — PX: ANTERIOR CRUCIATE LIGAMENT REPAIR: SHX115

## 2013-09-14 ENCOUNTER — Encounter: Payer: Self-pay | Admitting: Neurology

## 2013-09-19 ENCOUNTER — Ambulatory Visit (INDEPENDENT_AMBULATORY_CARE_PROVIDER_SITE_OTHER): Payer: Medicare HMO | Admitting: Neurology

## 2013-09-19 ENCOUNTER — Encounter: Payer: Self-pay | Admitting: Neurology

## 2013-09-19 VITALS — BP 116/77 | HR 87 | Resp 16

## 2013-09-19 DIAGNOSIS — F489 Nonpsychotic mental disorder, unspecified: Secondary | ICD-10-CM

## 2013-09-19 DIAGNOSIS — F5105 Insomnia due to other mental disorder: Secondary | ICD-10-CM

## 2013-09-19 MED ORDER — QUETIAPINE FUMARATE 25 MG PO TABS: 25.0000 mg | ORAL_TABLET | Freq: Every day | ORAL | Status: DC

## 2013-09-19 MED ORDER — AMPHETAMINE-DEXTROAMPHET ER 10 MG PO CP24: 10.0000 mg | ORAL_CAPSULE | Freq: Every day | ORAL | Status: DC

## 2013-09-19 NOTE — Progress Notes (Signed)
[facility]  Provider:  Larey Seat, M D  Referring Provider: Hendricks Limes, MD Primary Care Physician:  Unice Cobble, MD  Chief Complaint  Patient presents with  . Follow-up    Room 10  . Insomnia    HPI:  Susan Fisher is a 48 y.o. female  Is seen here as a  prolonged  revisit  from Dr. Linna Darner , follow up for medicine refills to control insomnia. Last visit  10-10-2011.  This patient has a history of excessive daytime sleepiness, chronic insomnia and acid reflux disease. She is treated for depression and Follow up with a therapist. She has started seroquel  to control her insomnia and has been doing wellfor 3 years.  She also had in her last visit reported that her gastrointestinal symptoms resolved after she change to a gluten-free diet.  The patient has nausea history is currently not gainfully employed, she has never used tobacco products she drinks 2 caffeinated beverages per day and up to 4 beverages of alcohol on a week and. She's a Forensic psychologist.  Family medical history her father is deceased from a pulmonary embolism following a surgical procedure. Her mother died of a stroke. Also significant for heart disease comes the COPD and depression in her mother. Her mother had essential tremor as have 2 of her sisters.  I will refill today Seroquel 25 minute 90 day supply and 10 mg tablets of generic Adderall.      Review of Systems: Out of a complete 14 system review, the patient complains of only the following symptoms, and all other reviewed systems are negative. 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  Still in a brace. Orthopedist Dr. [name].  Fatigue severity score today is 29, at the sleepiness score at 5 points these are unusually good results for the patient prior to medication her fatigue score was 51 and her Epworth 12  points.  History   Social History  . Marital Status: Single     Spouse Name: N/A    Number of Children: N/A  . Years of Education: College   Occupational History  .  [employer]   Social History Main Topics  . Smoking status: Never Smoker   . Smokeless tobacco: Never Used  . Alcohol Use: 2.4 oz/week    4 Glasses of wine per week     Comment:  socially  . Drug Use: No  . Sexual Activity: Not on file   Other Topics Concern  . Not on file   Social History Narrative   Patient is single and lives alone.     Patient has a college education.   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   Patient drinks two caffeine drinks daily.   Patient is right-handed.          Family History  Problem Relation Age of Onset  . Anxiety disorder Mother   . Depression Mother   . Hypertension Mother   . Stroke Mother 58  . Heart failure Mother   . Heart attack Mother 26  . Breast cancer Mother 33  . COPD Mother     smoker  . Heart attack Paternal Grandfather 19  . Pulmonary embolism Father     post rotator cuff surgery  . Tremor Sister   . Tremor Sister     Past Medical History  Diagnosis Date  . Sleep disorder     Dr  Dohmier  . Gluten intolerance 2011    Dr Marquette Saa  . Chronic insomnia   . Depression   . Anxiety   . Insomnia   . Excessive daytime sleepiness     Past Surgical History  Procedure Laterality Date  . Upper gastrointestinal endoscopy  2008    neg; Dr Watt Climes  . Upper gastrointestinal endoscopy  2009     [facility]  . Knee arthroscopy  1995    Current Outpatient Prescriptions  Medication Sig Dispense Refill  . ALPRAZolam (XANAX) 0.5 MG tablet Take 0.25 mg by mouth at bedtime as needed for sleep.      Marland Kitchen amphetamine-dextroamphetamine (ADDERALL XR) 10 MG 24 hr capsule Take 1 capsule (10 mg total) by mouth daily.  90 capsule  0  . diazepam (VALIUM) 2 MG tablet as needed.      Marland Kitchen HYDROcodone-acetaminophen (NORCO/VICODIN) 5-325 MG per tablet Take 1 tablet by mouth every 4 (four) hours as needed for pain.  20 tablet  0  .  HYDROmorphone (DILAUDID) 2 MG tablet as needed.      Marland Kitchen ibuprofen (ADVIL,MOTRIN) 200 MG tablet Take 200 mg by mouth every 8 (eight) hours as needed for pain.      Marland Kitchen oxyCODONE (OXY IR/ROXICODONE) 5 MG immediate release tablet as needed.      Marland Kitchen QUEtiapine (SEROQUEL) 25 MG tablet Take 1 tablet (25 mg total) by mouth at bedtime.  90 tablet  3  . traMADol (ULTRAM) 50 MG tablet Hasn't picked up rx yet       No current facility-administered medications for this visit.    Allergies as of 09/19/2013 - Review Complete 09/19/2013  Allergen Reaction Noted  . Gluten meal Other (See Comments) 12/14/2012  . Venlafaxine      Vitals: BP 116/77  Pulse 87  Resp 16  LMP 09/15/2013 Last Weight:  Wt Readings from Last 1 Encounters:  12/13/12 110 lb (49.896 kg)   Last Height:   Ht Readings from Last 1 Encounters:  12/13/12 4\' 10"  (1.473 m)    Physical exam:  General: The patient is awake, alert and appears not in acute distress. The patient is well groomed. Head: Normocephalic, atraumatic. Neck is supple. Mallampati 2 , neck circumference:14  Cardiovascular:  Regular rate and rhythm, without  murmurs or carotid bruit, and without distended neck veins. Respiratory: Lungs are clear to auscultation. Skin:  Without evidence of edema, or rash Trunk: BMI is normal .  Neurologic exam : The patient is awake and alert, oriented to place and time.   Memory subjective described as intact.  There is a normal attention span & concentration ability. Speech is fluent without dysarthria, dysphonia or aphasia.  Mood and affect are appropriate.  Cranial nerves: Pupils are equal and briskly reactive to light. Funduscopic exam without   evidence of pallor or edema. Extraocular movements  in vertical and horizontal planes intact and without nystagmus. Visual fields by finger perimetry are intact. Hearing to finger rub intact.  Facial sensation intact to fine touch. Facial motor strength is symmetric and tongue and  uvula move midline.  Motor exam:  Normal tone and   muscle bulk and symmetric normal strength in all extremities.  Sensory:  Fine touch, pinprick and vibration were tested in all extremities. Proprioception is  normal.  Coordination: Rapid alternating movements in the fingers/hands is tested and normal. Finger-to-nose maneuver tested and normal without evidence of ataxia, dysmetria or tremor.  Gait and station: Patient walks without assistive device , climbs  up to the exam table. Strength within normal limits.  Deep tendon reflexes: in the  upper and lower extremities are symmetric and intact. Babinski maneuver response is  downgoing.   Assessment:  After physical and neurologic examination, review of laboratory studies, imaging, neurophysiology testing and pre-existing records, assessment is  1) chronic insomnia, treated with Seroquel 25 mg nightly.  2) Blood work last done by Dr Linna Darner, normal range. 3) Adderall 10 mg tab daily user.

## 2013-10-08 ENCOUNTER — Other Ambulatory Visit: Payer: Self-pay | Admitting: Neurology

## 2013-10-25 ENCOUNTER — Encounter: Payer: Self-pay | Admitting: Vascular Surgery

## 2013-10-25 ENCOUNTER — Other Ambulatory Visit (HOSPITAL_COMMUNITY): Payer: Self-pay | Admitting: Orthopedic Surgery

## 2013-10-25 ENCOUNTER — Ambulatory Visit (HOSPITAL_COMMUNITY)
Admission: RE | Admit: 2013-10-25 | Discharge: 2013-10-25 | Disposition: A | Discharge status: Home / Self Care | Payer: Medicare HMO | Source: Ambulatory Visit | Attending: Vascular Surgery | Admitting: Vascular Surgery

## 2013-10-25 ENCOUNTER — Ambulatory Visit (INDEPENDENT_AMBULATORY_CARE_PROVIDER_SITE_OTHER): Payer: Medicare HMO | Admitting: Vascular Surgery

## 2013-10-25 VITALS — BP 133/76 | HR 75 | Ht <= 58 in | Wt 103.0 lb

## 2013-10-25 DIAGNOSIS — I82612 Acute embolism and thrombosis of superficial veins of left upper extremity: Secondary | ICD-10-CM

## 2013-10-25 DIAGNOSIS — I809 Phlebitis and thrombophlebitis of unspecified site: Secondary | ICD-10-CM | POA: Insufficient documentation

## 2013-10-25 DIAGNOSIS — I82619 Acute embolism and thrombosis of superficial veins of unspecified upper extremity: Secondary | ICD-10-CM

## 2013-10-25 NOTE — Progress Notes (Signed)
[facility] [place] HISTORY AND PHYSICAL   History of Present Illness:  Patient is a 48 y.o. year old female who presents for evaluation of left cephalic vein thrombosis. Patient had a recent orthopedic knee procedure by Dr. [name]. She developed a thrombophlebitis of the left cephalic vein at the site of an IV placement. She still has a palpable cord present. She was sent for further evaluation of this. She denies any pain over the vein. There is no erythema. She has no fever. Other medical problems include depression anxiety both of which are currently stable. She denies prior history of other venous thrombosis or pulmonary embolus. She states that her father did have a pulmonary embolus after a shoulder operation.  Past Medical History  Diagnosis Date  . Sleep disorder     Dr Beacher May  . Gluten intolerance 2011    Dr Marquette Saa  . Chronic insomnia   . Depression   . Anxiety   . Insomnia   . Excessive daytime sleepiness   . Atrial fibrillation     Past Surgical History  Procedure Laterality Date  . Upper gastrointestinal endoscopy  2008    neg; Dr Watt Climes  . Upper gastrointestinal endoscopy  2009     [facility]  . Knee arthroscopy  1995  . Anterior cruciate ligament repair      Social History History  Substance Use Topics  . Smoking status: Never Smoker   . Smokeless tobacco: Never Used  . Alcohol Use: 4.2 oz/week    7 Glasses of wine per week     Comment:  socially    Family History Family History  Problem Relation Age of Onset  . Anxiety disorder Mother   . Depression Mother   . Hypertension Mother   . Stroke Mother 43  . Heart failure Mother   . Heart attack Mother 23  . Breast cancer Mother 37  . COPD Mother     smoker  . Deep vein thrombosis Mother   . Heart attack Paternal Grandfather 83  . Pulmonary embolism Father     post rotator cuff surgery  . Tremor Sister   . Tremor Sister     Allergies  Allergies  Allergen Reactions  .  Gluten Meal Other (See Comments)    Gluten intolerant  . Venlafaxine     ineffective     Current Outpatient Prescriptions  Medication Sig Dispense Refill  . ALPRAZolam (XANAX) 0.5 MG tablet Take 0.25 mg by mouth at bedtime as needed for sleep.      Marland Kitchen amphetamine-dextroamphetamine (ADDERALL XR) 10 MG 24 hr capsule Take 1 capsule (10 mg total) by mouth daily.  90 capsule  0  . diazepam (VALIUM) 2 MG tablet as needed.      Marland Kitchen HYDROcodone-acetaminophen (NORCO/VICODIN) 5-325 MG per tablet Take 1 tablet by mouth every 4 (four) hours as needed for pain.  20 tablet  0  . HYDROmorphone (DILAUDID) 2 MG tablet as needed.      Marland Kitchen ibuprofen (ADVIL,MOTRIN) 200 MG tablet Take 200 mg by mouth every 8 (eight) hours as needed for pain.      Marland Kitchen oxyCODONE (OXY IR/ROXICODONE) 5 MG immediate release tablet as needed.      Marland Kitchen QUEtiapine (SEROQUEL) 25 MG tablet Take 1 tablet (25 mg total) by mouth at bedtime.  90 tablet  3  . QUEtiapine (SEROQUEL) 25 MG tablet take 1 tablet by mouth once daily  90 tablet  3  . traMADol (ULTRAM) 50  MG tablet Hasn't picked up rx yet       No current facility-administered medications for this visit.    ROS:   General:  No weight loss, Fever, chills  HEENT: No recent headaches, no nasal bleeding, no visual changes, no sore throat  Neurologic: No dizziness, blackouts, seizures. No recent symptoms of stroke or mini- stroke. No recent episodes of slurred speech, or temporary blindness.  Cardiac: No recent episodes of chest pain/pressure, no shortness of breath at rest.  No shortness of breath with exertion.  Denies history of atrial fibrillation or irregular heartbeat  Vascular: No history of rest pain in feet.  No history of claudication.  No history of non-healing ulcer, No history of DVT   Pulmonary: No home oxygen, no productive cough, no hemoptysis,  No asthma or wheezing  Musculoskeletal:  [ ]  Arthritis, [ ]  Low back pain,  [ ]  Joint pain  Hematologic:No history of  hypercoagulable state.  No history of easy bleeding.  No history of anemia  Gastrointestinal: No hematochezia or melena,  No gastroesophageal reflux, no trouble swallowing  Urinary: [ ]  chronic Kidney disease, [ ]  on HD - [ ]  MWF or [ ]  TTHS, [ ]  Burning with urination, [ ]  Frequent urination, [ ]  Difficulty urinating;   Skin: No rashes  Psychological: +history of anxiety,  + history of depression   Physical Examination  Filed Vitals:   10/25/13 1618  BP: 133/76  Pulse: 75  Height: 4\' 10"  (1.473 m)  Weight: 103 lb (46.72 kg)  SpO2: 100%    Body mass index is 21.53 kg/(m^2).  General:  Alert and oriented, no acute distress HEENT: Normal Neck: No bruit or JVD Pulmonary: Clear to auscultation bilaterally Cardiac: Regular Rate and Rhythm without murmur Skin: No rash Extremity Pulses:  2+ radial, brachial, pulses bilaterally, palpable cord left forearm cephalic vein no surrounding erythema no drainage Musculoskeletal: No deformity or edema  Neurologic: Upper and lower extremity motor 5/5 and symmetric  DATA: Duplex ultrasound was performed left upper extremity today. This showed no evidence of DVT. She did have thrombosis of the left cephalic vein from the wrist extending to the proximal arm   ASSESSMENT:  Chronic superficial thrombosis left cephalic vein   PLAN:  Patient was reassured that she is not at risk of embolization from this. She is now one month out from this thrombosis. The vein may recanalize over time if she may be left with a permanent palpable cord. He is she has any other episodes of venous thrombosis in the future that cannot be explained by a traumatic event such as an IV and she should undergo hypercoagulable workup.  Ruta Hinds, MD [facility] [place] Office: [phone] Pager: [phone]

## 2013-11-23 ENCOUNTER — Other Ambulatory Visit: Payer: Self-pay

## 2013-11-23 DIAGNOSIS — Z1231 Encounter for screening mammogram for malignant neoplasm of breast: Secondary | ICD-10-CM

## 2013-12-03 ENCOUNTER — Ambulatory Visit
Admission: RE | Admit: 2013-12-03 | Discharge: 2013-12-03 | Disposition: A | Discharge status: Home / Self Care | Payer: Managed Care, Other (non HMO) | Source: Ambulatory Visit

## 2013-12-03 DIAGNOSIS — Z1231 Encounter for screening mammogram for malignant neoplasm of breast: Secondary | ICD-10-CM

## 2014-01-10 ENCOUNTER — Telehealth: Payer: Self-pay | Admitting: *Deleted

## 2014-01-10 NOTE — Telephone Encounter (Signed)
Pt calling for refill on adderall.  She takes 10mg  po daily, # 90 (per last prescription).

## 2014-01-15 MED ORDER — AMPHETAMINE-DEXTROAMPHET ER 10 MG PO CP24: 10.0000 mg | ORAL_CAPSULE | Freq: Every day | ORAL | Status: DC

## 2014-01-16 NOTE — Telephone Encounter (Signed)
Called pt to inform her that her Rx was ready to be picked up at the front desk and if she has any other problems, questions or concerns to call the office. Pt verbalized understanding. °

## 2014-03-12 ENCOUNTER — Other Ambulatory Visit: Payer: Self-pay | Admitting: Neurology

## 2014-03-12 MED ORDER — AMPHETAMINE-DEXTROAMPHET ER 10 MG PO CP24: 10.0000 mg | ORAL_CAPSULE | Freq: Every day | ORAL | Status: DC

## 2014-03-12 NOTE — Telephone Encounter (Signed)
I called the patient to let them know their Rx for Adderall XR was ready for pickup. Patient was instructed to bring Photo ID. 

## 2014-03-12 NOTE — Telephone Encounter (Signed)
Patient called back and stated Rx bottle from Pharmacy showed 30 capsules with no refills.  Please call and advise

## 2014-03-12 NOTE — Telephone Encounter (Signed)
Patient requesting Rx refill for amphetamine-dextroamphetamine (ADDERALL XR) 10 MG 24 hr capsule.  Please call when ready for pick up. °

## 2014-03-12 NOTE — Telephone Encounter (Signed)
I called the pharmacy.  Spoke with pharmacist who verified they only dispensed #30 instead of #90.  Says the patient's ins plan changed and would not allow more than 30 day supply.  Request for 30 days has been entered and forwarded to provider for approval

## 2014-03-12 NOTE — Telephone Encounter (Signed)
A 90 day Rx was last written on 09/22, which should last until 12/22.  I called the patient back.  Got no answer.  Left message.

## 2014-04-22 ENCOUNTER — Other Ambulatory Visit: Payer: Self-pay | Admitting: Neurology

## 2014-04-22 MED ORDER — AMPHETAMINE-DEXTROAMPHET ER 10 MG PO CP24: 10.0000 mg | ORAL_CAPSULE | Freq: Every day | ORAL | Status: DC

## 2014-04-22 NOTE — Telephone Encounter (Signed)
Patient requesting Rx refill for amphetamine-dextroamphetamine (ADDERALL XR) 10 MG 24 hr capsule.  Please call when ready for pick up.

## 2014-04-22 NOTE — Telephone Encounter (Signed)
Request entered, forwarded to provider for approval.  

## 2014-04-23 NOTE — Telephone Encounter (Signed)
I called the patient to let them know their Rx for Adderall XR was ready for pickup. Patient was instructed to bring Photo ID.

## 2014-04-23 NOTE — Addendum Note (Signed)
Addended by: Simonne Maffucci on: 04/23/2014 09:00 AM   Modules accepted: Medications

## 2014-05-22 ENCOUNTER — Encounter: Payer: Self-pay | Admitting: Adult Health

## 2014-05-22 ENCOUNTER — Ambulatory Visit (INDEPENDENT_AMBULATORY_CARE_PROVIDER_SITE_OTHER): Payer: Medicare HMO | Admitting: Adult Health

## 2014-05-22 VITALS — BP 122/81 | HR 85 | Ht 59.0 in | Wt 113.0 lb

## 2014-05-22 DIAGNOSIS — F489 Nonpsychotic mental disorder, unspecified: Secondary | ICD-10-CM

## 2014-05-22 DIAGNOSIS — G4719 Other hypersomnia: Secondary | ICD-10-CM

## 2014-05-22 DIAGNOSIS — F5105 Insomnia due to other mental disorder: Secondary | ICD-10-CM

## 2014-05-22 MED ORDER — AMPHETAMINE-DEXTROAMPHET ER 15 MG PO CP24: 15.0000 mg | ORAL_CAPSULE | Freq: Every day | ORAL | Status: DC

## 2014-05-22 NOTE — Patient Instructions (Signed)
I will increase Adderall XR to 15 mg daily. - if you are unable to tolerate the increase please let us know.  Continue Seroquel.  If your symptoms worsen or you develop new symptoms please let us know.

## 2014-05-22 NOTE — Progress Notes (Signed)
PATIENT: Susan Fisher DOB: 1965-12-02  REASON FOR VISIT: follow up- insomnia and excessive daytime sleepiness.  HISTORY FROM: patient  HISTORY OF PRESENT ILLNESS: Susan Fisher is a 49 year old female with a history of insomnia and excessive daytime sleepiness. She returns today for follow-up. She is currently taking Seroquel and Adderall and tolerating those well. She states that she is sleeping well at night. She states that she will have insomnia sporadically. On average she gets about 6-7  hours of sleep. She states that the adderall worked well at first but now she states that she is getting sleepy after lunch. She denies falling asleep while at work but she states that she is suffering to stay awake. Patient states that since the last visit she did have knee surgery other than that no other medical history. Epworth is 8 was previously 5 and fatigue severity score is 40 was previously 29.   HISTORY 09/19/13 [facility]): Susan Fisher is a 49 y.o. female  Is seen here as a  prolonged  revisit  from Dr. Linna Darner , follow up for medicine refills to control insomnia. Last visit  10-10-2011.  This patient has a history of excessive daytime sleepiness, chronic insomnia and acid reflux disease. She is treated for depression and Follow up with a therapist. She has started seroquel  to control her insomnia and has been doing wellfor 3 years.  She also had in her last visit reported that her gastrointestinal symptoms resolved after she change to a gluten-free diet.The patient has nausea history is currently not gainfully employed, she has never used tobacco products she drinks 2 caffeinated beverages per day and up to 4 beverages of alcohol on a week and. 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 Her mother died of a stroke. Also significant for heart disease comes the COPD and depression in her mother. Her mother had essential  tremor as have 2 of her sisters. I will refill today Seroquel 25 minute 90 day supply and 10 mg tablets of generic Adderall.  REVIEW OF SYSTEMS: Out of a complete 14 system review of symptoms, the patient complains only of the following symptoms, and all other reviewed systems are negative.  Insomnia, daytime sleepiness, neck pain  ALLERGIES: Allergies  Allergen Reactions  . Gluten Meal Other (See Comments)    Gluten intolerant  . Venlafaxine     ineffective    HOME MEDICATIONS: Outpatient Prescriptions Prior to Visit  Medication Sig Dispense Refill  . ALPRAZolam (XANAX) 0.5 MG tablet Take 0.25 mg by mouth at bedtime as needed for sleep.    Marland Kitchen amphetamine-dextroamphetamine (ADDERALL XR) 10 MG 24 hr capsule Take 1 capsule (10 mg total) by mouth daily. 30 capsule 0  . HYDROcodone-acetaminophen (NORCO/VICODIN) 5-325 MG per tablet Take 1 tablet by mouth every 4 (four) hours as needed for pain. 20 tablet 0  . ibuprofen (ADVIL,MOTRIN) 200 MG tablet Take 200 mg by mouth every 8 (eight) hours as needed for pain.    . meloxicam (MOBIC) 15 MG tablet Take 15 mg by mouth daily.  0  . QUEtiapine (SEROQUEL) 25 MG tablet Take 1 tablet (25 mg total) by mouth at bedtime. 90 tablet 3  . QUEtiapine (SEROQUEL) 25 MG tablet take 1 tablet by mouth once daily 90 tablet 3  . diazepam (VALIUM) 2 MG tablet as needed.    Marland Kitchen HYDROmorphone (DILAUDID) 2 MG tablet as needed.    Marland Kitchen  oxyCODONE (OXY IR/ROXICODONE) 5 MG immediate release tablet as needed.    . traMADol (ULTRAM) 50 MG tablet Hasn't picked up rx yet     No facility-administered medications prior to visit.    PAST MEDICAL HISTORY: Past Medical History  Diagnosis Date  . Sleep disorder     Dr Beacher May  . Gluten intolerance 2011    Dr Marquette Saa  . Chronic insomnia   . Depression   . Anxiety   . Insomnia   . Excessive daytime sleepiness   . Atrial fibrillation     PAST SURGICAL HISTORY: Past Surgical History  Procedure Laterality Date  .  Upper gastrointestinal endoscopy  2008    neg; Dr Watt Climes  . [facility]  [facility]  . Knee arthroscopy  1995  . Anterior cruciate ligament repair      FAMILY HISTORY: Family History  Problem Relation Age of Onset  . Anxiety disorder Mother   . Depression Mother   . Hypertension Mother   . Stroke Mother 20  . Heart failure Mother   . Heart attack Mother 19  . Breast cancer Mother 52  . COPD Mother     smoker  . Deep vein thrombosis Mother   . Heart attack Paternal Grandfather 56  . Pulmonary embolism Father     post rotator cuff surgery  . Tremor Sister   . Tremor Sister         PHYSICAL EXAM  Filed Vitals:   05/22/14 1310  BP: 122/81  Pulse: 85  Height: 4\' 11"  (1.499 m)  Weight: 113 lb (51.256 kg)   Body mass index is 22.81 kg/(m^2).  Generalized: Well developed, in no acute distress   Neurological examination  Mentation: Alert oriented to time, place, history taking. Follows all commands speech and language fluent Cranial nerve II-XII: Pupils were equal round reactive to light. Extraocular movements were full, visual field were full on confrontational test. Facial sensation and strength were normal. Uvula tongue midline. Head turning and shoulder shrug  were normal and symmetric. Motor: The motor testing reveals 5 over 5 strength of all 4 extremities. Good symmetric motor tone is noted throughout.  Sensory: Sensory testing is intact to soft touch on all 4 extremities. No evidence of extinction is noted.  Coordination: Cerebellar testing reveals good finger-nose-finger and heel-to-shin bilaterally.  Gait and station: Gait is normal. Tandem gait is normal. Romberg is negative. No drift is seen.  Reflexes: Deep tendon reflexes are symmetric and normal bilaterally.     DIAGNOSTIC DATA (LABS, IMAGING, TESTING) - I reviewed patient records, labs, notes, testing and imaging myself where available.  Lab Results  Component Value Date   WBC  7.1 12/14/2012   HGB 13.7 12/14/2012   HCT 40.7 12/14/2012   MCV 89.1 12/14/2012   PLT 206 12/14/2012      Component Value Date/Time   NA 136 12/14/2012 0250   K 3.6 12/14/2012 0250   CL 105 12/14/2012 0250   CO2 25 12/14/2012 0250   GLUCOSE 100* 12/14/2012 0250   BUN 14 12/14/2012 0250   CREATININE 0.81 12/14/2012 0250   CALCIUM 8.5 12/14/2012 0250   PROT 6.5 02/01/2012 0813   ALBUMIN 3.7 02/01/2012 0813   AST 17 02/01/2012 0813   ALT 13 02/01/2012 0813   ALKPHOS 40 02/01/2012 0813   BILITOT 1.0 02/01/2012 0813   GFRNONAA 86* 12/14/2012 0250   GFRAA >90 12/14/2012 0250   Lab Results  Component Value Date  CHOL 150 02/01/2012   HDL 63.20 02/01/2012   LDLCALC 78 02/01/2012   TRIG 42.0 02/01/2012   CHOLHDL 2 02/01/2012   No results found for: HGBA1C No results found for: VITAMINB12 Lab Results  Component Value Date   TSH 2.44 02/01/2012      ASSESSMENT AND PLAN 49 y.o. year old female  has a past medical history of Sleep disorder; Gluten intolerance (2011); Chronic insomnia; Depression; Anxiety; Insomnia; Excessive daytime sleepiness; and Atrial fibrillation. here with:  1. Insomnia 2. Excessive daytime sleepiness  The patient feels that the Adderall is no longer working as well as it did when she began taking it. She is having trouble staying awake in the afternoons. Her Epworth score and fatigue severity score have increased since the last visit. I will increase her Adderall XR to 15 mg daily. she will let me know if this is not beneficial. Patient's heart rate and blood pressure are within normal limits. She will continue taking Seroquel 25 mg at bedtime. If her symptoms worsen or she develops new symptoms she should let us know. Otherwise she will follow up in 6 months or sooner if needed.   Ward Givens, MSN, NP-C 05/22/2014, 1:28 PM [facility] [address] [place], [place] [address] [phone]  Note: This document was  prepared with digital dictation and possible smart phrase technology. Any transcriptional errors that result from this process are unintentional.

## 2014-05-22 NOTE — Progress Notes (Signed)
I agree with the assessment and plan as directed by NP .The patient is known to me .   Coury Grieger, MD  

## 2014-06-03 ENCOUNTER — Telehealth: Payer: Self-pay

## 2014-06-03 NOTE — Telephone Encounter (Signed)
Dr Asa Lente requested to contact pt and inform that MD will take her on as a new pt with the understanding that there are limited availability.

## 2014-06-24 ENCOUNTER — Telehealth: Payer: Self-pay | Admitting: Adult Health

## 2014-06-24 ENCOUNTER — Telehealth: Payer: Self-pay

## 2014-06-24 MED ORDER — AMPHETAMINE-DEXTROAMPHET ER 15 MG PO CP24: 15.0000 mg | ORAL_CAPSULE | Freq: Every day | ORAL | Status: DC

## 2014-06-24 NOTE — Telephone Encounter (Signed)
Patient requesting refill for Rx amphetamine-dextroamphetamine (ADDERALL XR) 15 MG 24 hr capsule.  Please call when ready for pick up.

## 2014-06-24 NOTE — Telephone Encounter (Signed)
medication

## 2014-06-24 NOTE — Telephone Encounter (Signed)
Medication refilled. Prescription placed in pharmacy with Larey Seat.

## 2014-06-24 NOTE — Telephone Encounter (Signed)
Called patient and informed Rx ready for pick up at front desk. Patient verbalized understanding.  

## 2014-07-16 ENCOUNTER — Other Ambulatory Visit: Payer: Self-pay | Admitting: Physician Assistant

## 2014-08-01 ENCOUNTER — Telehealth: Payer: Self-pay

## 2014-08-01 ENCOUNTER — Other Ambulatory Visit: Payer: Self-pay | Admitting: Neurology

## 2014-08-01 MED ORDER — AMPHETAMINE-DEXTROAMPHET ER 15 MG PO CP24: 15.0000 mg | ORAL_CAPSULE | Freq: Every day | ORAL | Status: DC

## 2014-08-01 NOTE — Telephone Encounter (Signed)
Patient is calling to get a written Rx for amphetamine-dextroamphetamine (ADDERALL XR) 15 MG 24 hr capsule. Please call patient when ready for pickup. Thank you.

## 2014-08-01 NOTE — Telephone Encounter (Signed)
Called and spoke to patient and relayed her refill was ready for pick up. Adderall. Patient is aware of office hours. RX is at the front desk. Patient understood.

## 2014-08-01 NOTE — Telephone Encounter (Signed)
Request entered, forwarded to NP for approval since Dr Dohmeier is out of the office.

## 2014-08-01 NOTE — Telephone Encounter (Signed)
RX for Adderall at front desk.

## 2014-08-05 LAB — HM PAP SMEAR

## 2014-09-10 ENCOUNTER — Ambulatory Visit (INDEPENDENT_AMBULATORY_CARE_PROVIDER_SITE_OTHER): Payer: Managed Care, Other (non HMO) | Admitting: Family Medicine

## 2014-09-10 VITALS — BP 110/72 | HR 86 | Temp 98.1°F | Resp 18 | Ht 59.0 in | Wt 113.0 lb

## 2014-09-10 DIAGNOSIS — Z7189 Other specified counseling: Secondary | ICD-10-CM | POA: Diagnosis not present

## 2014-09-10 DIAGNOSIS — Z7184 Encounter for health counseling related to travel: Secondary | ICD-10-CM

## 2014-09-10 DIAGNOSIS — Z23 Encounter for immunization: Secondary | ICD-10-CM | POA: Diagnosis not present

## 2014-09-10 NOTE — Progress Notes (Signed)
[facility] [address] [place] [address] [phone]  Date:  09/10/2014   Name:  Susan Fisher   DOB:  1965/11/24   MRN:  [id number]  PCP:  Unice Cobble, MD    Chief Complaint: Immunizations   History of Present Illness:  Susan Fisher is a 49 y.o. very pleasant female patient who presents with the following:  She is traveling to the [place] on 6/18- she will be staying in a house there.   Tetanus shot in 2009 She does not plan any "adventurous eating" or contact with animals Her main concern is the hep a vaccine. Patient Active Problem List   Diagnosis Date Noted  . 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 Seasonal allergies 04/04/2009  . ANXIETY 10/09/2007  . UNSPECIFIED FUNCTIONAL DISORDER OF STOMACH 10/09/2007  . SLEEP DISORDER 10/09/2007  . KERATOCONJUNCTIVITIS 08/29/2007  . MITRAL VALVE DISORDERS 08/29/2007    Past Medical History  Diagnosis Date  . Sleep disorder     Dr Beacher May  . Gluten intolerance 2011    Dr Marquette Saa  . Chronic insomnia   . Depression   . Anxiety   . Insomnia   . Excessive daytime sleepiness   . Atrial fibrillation   . Allergy     Past Surgical History  Procedure Laterality Date  . Upper gastrointestinal endoscopy  2008    neg; Dr Watt Climes  . Upper gastrointestinal endoscopy  2009     [facility]  . Knee arthroscopy  1995  . Anterior cruciate ligament repair      History  Substance Use Topics  . Smoking status: Never Smoker   . Smokeless tobacco: Never Used  . Alcohol Use: 4.2 oz/week    7 Glasses of wine per week     Comment:  socially    Family History  Problem Relation Age of Onset  . Anxiety disorder Mother   . Depression Mother   . Hypertension Mother   . Stroke Mother 13  . Heart failure Mother   . Heart attack Mother 74  . Breast cancer Mother 31  . COPD Mother     smoker  . Deep vein thrombosis Mother   . Heart attack Paternal Grandfather 51  . Pulmonary embolism  Father     post rotator cuff surgery  . Tremor Sister   . Tremor Sister     Allergies  Allergen Reactions  . Gluten Meal Other (See Comments)    Gluten intolerant  . Venlafaxine     ineffective    Medication list has been reviewed and updated.  Current Outpatient Prescriptions on File Prior to Visit  Medication Sig Dispense Refill  . amphetamine-dextroamphetamine (ADDERALL XR) 15 MG 24 hr capsule Take 1 capsule by mouth daily. 30 capsule 0  . ibuprofen (ADVIL,MOTRIN) 200 MG tablet Take 200 mg by mouth every 8 (eight) hours as needed for pain.    . meloxicam (MOBIC) 15 MG tablet Take 15 mg by mouth daily.  0  . QUEtiapine (SEROQUEL) 25 MG tablet Take 1 tablet (25 mg total) by mouth at bedtime. 90 tablet 3  . ALPRAZolam (XANAX) 0.5 MG tablet Take 0.25 mg by mouth at bedtime as needed for sleep.    . diazepam (VALIUM) 2 MG tablet as needed.    Marland Kitchen HYDROcodone-acetaminophen (NORCO/VICODIN) 5-325 MG per tablet Take 1 tablet by mouth every 4 (four) hours as needed for pain. (Patient not taking: Reported on 09/10/2014) 20 tablet  0  . HYDROmorphone (DILAUDID) 2 MG tablet as needed.    Marland Kitchen oxyCODONE (OXY IR/ROXICODONE) 5 MG immediate release tablet as needed.    . traMADol (ULTRAM) 50 MG tablet Hasn't picked up rx yet     No current facility-administered medications on file prior to visit.    Review of Systems:  As per HPI- otherwise negative.   Physical Examination: Filed Vitals:   09/10/14 1942  BP: 110/72  Pulse: 86  Temp: 98.1 F (36.7 C)  Resp: 18   Filed Vitals:   09/10/14 1942  Height: 4\' 11"  (1.499 m)  Weight: 113 lb (51.256 kg)   Body mass index is 22.81 kg/(m^2). Ideal Body Weight: Weight in (lb) to have BMI = 25: 123.5   GEN: WDWN, NAD, Non-toxic, Alert & Oriented x 3 HEENT: Atraumatic, Normocephalic.  Ears and Nose: No external deformity. EXTR: No clubbing/cyanosis/edema NEURO: Normal gait.  PSYCH: Normally interactive. Conversant. Not depressed or anxious  appearing.  Calm demeanor.    Assessment and Plan: Immunization due - Plan: Hepatitis A vaccine adult IM  Travel advice encounter  Hep a #1 today- she declines Hep B vaccine or typhoid today, her tetanus is UTD    Signed Lamar Blinks, MD

## 2014-09-10 NOTE — Patient Instructions (Signed)
Have a wonderful time on your trip!  Avoid mosquitos as much as you can and use insect repellant.    Hepatitis A Vaccine: What You Need to Know 1. What is hepatitis A? Hepatitis A is a serious liver disease caused by the hepatitis A virus (HAV). HAV is found in the stool of people with hepatitis A. It is usually spread by close personal contact and sometimes by eating food or drinking water containing HAV. A person who has hepatitis A can easily pass the disease to others within the same household. Hepatitis A can cause:  "flu-like" illness  jaundice (yellow skin or eyes, dark urine)  severe stomach pains and diarrhea (children) People with hepatitis A often have to be hospitalized (up to about 1 person in 5). Adults with hepatitis A are often too ill to work for up to a month. Sometimes, people die as a result of hepatitis A (about 3-6 deaths per 1,000 cases). Hepatitis A vaccine can prevent hepatitis A. 2. Who should get hepatitis A vaccine and when? WHO Some people should be routinely vaccinated with hepatitis A vaccine:  All children between their first and second birthdays (73 through 46 months of age).  Anyone 1 year of age and older traveling to or working in countries with high or intermediate prevalence of hepatitis A, such as those located in [place] or [place], [place], [place] (except [place]), [place], and [place]. For more information see BlindResource.ca.  Children and adolescents 2 through 51 years of age who live in states or communities where routine vaccination has been implemented because of high disease incidence.  Men who have sex with men.  People who use street drugs.  People with chronic liver disease.  People who are treated with clotting factor concentrates.  People who work with HAV-infected primates or who work with HAV in Therapist, art.  Members of households planning to adopt a child, or care for a newly arriving adopted child, from  a country where hepatitis A is common. Other people might get hepatitis A vaccine in certain situations (ask your doctor for more details):  Unvaccinated children or adolescents in communities where outbreaks of hepatitis A are occurring.  Unvaccinated people who have been exposed to hepatitis A virus.  Anyone 1 year of age or older who wants protection from hepatitis A. Hepatitis A vaccine is not licensed for children younger than 1 year of age. WHEN For children, the first dose should be given at 13 through 32 months of age. Children who are not vaccinated by 14 years of age can be vaccinated at later visits. For others at risk, the hepatitis A vaccine series may be started whenever a person wishes to be protected or is at risk of infection. For travelers, it is best to start the vaccine series at least 1 month before traveling. (Some protection may still result if the vaccine is given on or closer to the travel date.) Some people who cannot get the vaccine before traveling, or for whom the vaccine might not be effective, can get a shot called immune globulin (IG). IG gives immediate, temporary protection. Two doses of the vaccine are needed for lasting protection. These doses should be given at least 6 months apart. Hepatitis A vaccine may be given at the same time as other vaccines. 3. Some people should not get hepatitis A vaccine or should wait.  Anyone who has ever had a severe (life threatening) allergic reaction to a previous dose of hepatitis A vaccine  should not get another dose.  Anyone who has a severe (life threatening) allergy to any vaccine component should not get the vaccine.  Tell your doctor if you have any severe allergies, including a severe allergy to latex. All hepatitis A vaccines contain alum, and some hepatitis A vaccines contain 2-phenoxyethanol.  Anyone who is moderately or severely ill at the time the shot is scheduled should probably wait until they recover. Ask  your doctor. People with a mild illness can usually get the vaccine.  Tell your doctor if you are pregnant. Because hepatitis A vaccine is inactivated (killed), the risk to a pregnant woman or her unborn baby is believed to be very low. But your doctor can weigh any theoretical risk from the vaccine against the need for protection. 4. What are the risks from hepatitis A vaccine? A vaccine, like any medicine, could possibly cause serious problems, such as severe allergic reactions. The risk of hepatitis A vaccine causing serious harm, or death, is extremely small. Getting hepatitis A vaccine is much safer than getting the disease. Mild problems  soreness where the shot was given (about 1 out of 2 adults, and up to 1 out of 6 children)  headache (about 1 out of 6 adults and 1 out of 25 children)  loss of appetite (about 1 out of 12 children)  tiredness (about 1 out of 14 adults) If these problems occur, they usually last 1 or 2 days. Severe problems  serious allergic reaction, within a few minutes to a few hours after the shot (very rare). 5. What if there is a serious reaction? What should I look for?  Look for anything that concerns you, such as signs of a severe allergic reaction, very high fever, or behavior changes. Signs of a severe allergic reaction can include hives, swelling of the face and throat, difficulty breathing, a fast heartbeat, dizziness, and weakness. These would start a few minutes to a few hours after the vaccination. What should I do?  If you think it is a severe allergic reaction or other emergency that can't wait, call 9-1-1 or get the person to the nearest hospital. Otherwise, call your doctor.  Afterward, the reaction should be reported to the Vaccine Adverse Event Reporting System (VAERS). Your doctor might file this report, or you can do it yourself through the VAERS web site at www.vaers.SamedayNews.es, or by calling 458 262 3259. VAERS is only for reporting  reactions. They do not give medical advice. 6. The National Vaccine Injury Compensation Program The [employer] Vaccine Injury Compensation Program (VICP) is a federal program that was created to compensate people who may have been injured by certain vaccines. Persons who believe they may have been injured by a vaccine can learn about the program and about filing a claim by calling (606) 004-3515 or visiting the [place] website at GoldCloset.com.ee. 7. How can I learn more?  Ask your doctor.  Call your local or state health department.  Contact the Centers for Disease Control and Prevention (CDC):  Call 305 538 9086 (1-800-CDC-INFO) or  Visit CDC's website at: http://hunter.com/ CDC Hepatitis A Vaccine VIS (Interim) (02/17/10)  Document Released: 02/04/2006 Document Revised: 08/27/2013 Document Reviewed: 05/24/2013 ExitCare Patient Information 2015 [place], [place]. This information is not intended to replace advice given to you by your health care provider. Make sure you discuss any questions you have with your health care provider.

## 2014-09-11 ENCOUNTER — Other Ambulatory Visit: Payer: Self-pay | Admitting: Neurology

## 2014-09-11 MED ORDER — AMPHETAMINE-DEXTROAMPHET ER 15 MG PO CP24: 15.0000 mg | ORAL_CAPSULE | Freq: Every day | ORAL | Status: DC

## 2014-09-11 NOTE — Telephone Encounter (Signed)
Request entered, forwarded to provider for approval.  

## 2014-09-11 NOTE — Telephone Encounter (Signed)
Patient called requesting a refill for amphetamine-dextroamphetamine (ADDERALL XR) 15 MG 24 hr capsule. Patient was advised that the script will be ready for pick up in 24hrs unless she hears otherwise by the nurse. Patient can be reached @ [phone]

## 2014-09-12 ENCOUNTER — Telehealth: Payer: Self-pay

## 2014-09-12 NOTE — Telephone Encounter (Signed)
Left message on cell per DPR that her RX was available for pick up. Gave clinic hours.

## 2014-09-26 ENCOUNTER — Other Ambulatory Visit: Payer: Self-pay

## 2014-09-26 MED ORDER — QUETIAPINE FUMARATE 25 MG PO TABS: 25.0000 mg | ORAL_TABLET | Freq: Every day | ORAL | Status: DC

## 2014-10-23 ENCOUNTER — Ambulatory Visit (INDEPENDENT_AMBULATORY_CARE_PROVIDER_SITE_OTHER): Payer: Managed Care, Other (non HMO) | Admitting: Family Medicine

## 2014-10-23 VITALS — BP 124/84 | HR 83 | Temp 98.7°F | Resp 14 | Ht 59.0 in | Wt 114.0 lb

## 2014-10-23 DIAGNOSIS — F411 Generalized anxiety disorder: Secondary | ICD-10-CM | POA: Diagnosis not present

## 2014-10-23 DIAGNOSIS — G4700 Insomnia, unspecified: Secondary | ICD-10-CM

## 2014-10-23 MED ORDER — ALPRAZOLAM 0.5 MG PO TABS: 0.2500 mg | ORAL_TABLET | Freq: Every evening | ORAL | Status: DC | PRN

## 2014-10-23 NOTE — Progress Notes (Signed)
Chief Complaint:  Chief Complaint  Patient presents with  . Anxiety  . Insomnia    HPI: Susan Fisher is a 49 y.o. female who is here for situational anxiety symptoms. She works for [employer] currently and recently got into the physical therapy program at [employer]. She is very anxious and having difficulty sleeping more than normal because she is going to quit her job and enroll in this physical therapy program. It is for 2 years. This has been what she's been working for the last several years. She is being followed by Dr. Roddie Mc for her chronic insomnia. She has been on Seroquel at nighttime for a long time. Sometimes it works. She would say that 2 out of 7 nights she would have difficulty sleeping. She has tried Brewing technologist, zoloft, lexapro, celexa ( ?) ,  She has tried Ambien , but she has never tried elavil, trazadone,  She has tried alprazolam in the past for anxiety issues that precipitates into her sleep and makes her insomnia worse. Alprazolam helps. She does not have any suicidal, homicidal ideations or hallucinations.    Past Medical History  Diagnosis Date  . Sleep disorder     Dr Beacher May  . Gluten intolerance 2011    Dr Marquette Saa  . Chronic insomnia   . Depression   . Anxiety   . Insomnia   . Excessive daytime sleepiness   . Atrial fibrillation   . Allergy    Past Surgical History  Procedure Laterality Date  . Upper gastrointestinal endoscopy  2008    neg; Dr Watt Climes  . Upper gastrointestinal endoscopy  2009     [facility]  . Knee arthroscopy  1995  . Anterior cruciate ligament repair     History   Social History  . Marital Status: Single    Spouse Name: N/A  . Number of Children: N/A  . Years of Education: College   Occupational History  .  [employer]   Social History Main Topics  . Smoking status: Never Smoker   . Smokeless tobacco: Never Used  . Alcohol Use: 4.2 oz/week    7 Glasses of wine per week     Comment:  socially  .  Drug Use: No  . Sexual Activity: Not on file   Other Topics Concern  . None   Social History Narrative   Patient is single and lives alone.     Patient has a college education.   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   Patient drinks two caffeine drinks daily.   Patient is right-handed.         Family History  Problem Relation Age of Onset  . Anxiety disorder Mother   . Depression Mother   . Hypertension Mother   . Stroke Mother 26  . Heart failure Mother   . Heart attack Mother 16  . Breast cancer Mother 9  . COPD Mother     smoker  . Deep vein thrombosis Mother   . Heart attack Paternal Grandfather 23  . Pulmonary embolism Father     post rotator cuff surgery  . Tremor Sister   . Tremor Sister    Allergies  Allergen Reactions  . Gluten Meal Other (See Comments)    Gluten intolerant  . Venlafaxine     ineffective   Prior to Admission medications   Medication Sig Start Date End Date Taking? Authorizing Provider  amphetamine-dextroamphetamine (ADDERALL XR) 15 MG  24 hr capsule Take 1 capsule by mouth daily. 09/11/14  Yes Carmen Dohmeier, MD  QUEtiapine (SEROQUEL) 25 MG tablet Take 1 tablet (25 mg total) by mouth at bedtime. 09/26/14  Yes Carmen Dohmeier, MD  ALPRAZolam Duanne Moron) 0.5 MG tablet Take 0.25 mg by mouth at bedtime as needed for sleep.    Historical Provider, MD  ibuprofen (ADVIL,MOTRIN) 200 MG tablet Take 200 mg by mouth every 8 (eight) hours as needed for pain.    Historical Provider, MD     ROS: The patient denies fevers, chills, night sweats, unintentional weight loss, chest pain, palpitations, wheezing, dyspnea on exertion, nausea, vomiting, abdominal pain, dysuria, hematuria, melena, numbness, weakness, or tingling.   All other systems have been reviewed and were otherwise negative with the exception of those mentioned in the HPI and as above.    PHYSICAL EXAM: Filed Vitals:   10/23/14 1732  BP: 124/84  Pulse: 83  Temp: 98.7 F (37.1  C)  Resp: 14   Filed Vitals:   10/23/14 1732  Height: 4\' 11"  (1.499 m)  Weight: 114 lb (51.71 kg)   Body mass index is 23.01 kg/(m^2).   General: Alert, no acute distress HEENT:  Normocephalic, atraumatic, oropharynx patent. EOMI, PERRLA, funduscopic exam normal, no appreciable thyroidmegaly Cardiovascular:  Regular rate and rhythm, no rubs murmurs or gallops.  No Carotid bruits, radial pulse intact. No pedal edema.  Respiratory: Clear to auscultation bilaterally.  No wheezes, rales, or rhonchi.  No cyanosis, no use of accessory musculature GI: No organomegaly, abdomen is soft and non-tender, positive bowel sounds.  No masses. Skin: No rashes. Neurologic: Facial musculature symmetric. Psychiatric: Patient is appropriate throughout our interaction. Lymphatic: No cervical lymphadenopathy Musculoskeletal: Gait intact.   LABS: Results for orders placed or performed during the hospital encounter of 12/13/12  Wet prep, genital  Result Value Ref Range   Yeast Wet Prep HPF POC NONE SEEN NONE SEEN   Trich, Wet Prep NONE SEEN NONE SEEN   Clue Cells Wet Prep HPF POC NONE SEEN NONE SEEN   WBC, Wet Prep HPF POC TOO NUMEROUS TO COUNT (A) NONE SEEN  GC/Chlamydia Probe Amp  Result Value Ref Range   CT Probe RNA NEGATIVE NEGATIVE   GC Probe RNA NEGATIVE NEGATIVE  Urinalysis, Routine w reflex microscopic  Result Value Ref Range   Color, Urine YELLOW YELLOW   APPearance CLEAR CLEAR   Specific Gravity, Urine 1.015 1.005 - 1.030   pH 8.0 5.0 - 8.0   Glucose, UA NEGATIVE NEGATIVE mg/dL   Hgb urine dipstick NEGATIVE NEGATIVE   Bilirubin Urine NEGATIVE NEGATIVE   Ketones, ur NEGATIVE NEGATIVE mg/dL   Protein, ur NEGATIVE NEGATIVE mg/dL   Urobilinogen, UA 0.2 0.0 - 1.0 mg/dL   Nitrite NEGATIVE NEGATIVE   Leukocytes, UA TRACE (A) NEGATIVE  Urine microscopic-add on  Result Value Ref Range   Squamous Epithelial / LPF RARE RARE   WBC, UA 0-2 <3 WBC/hpf   Bacteria, UA RARE RARE  CBC with  Differential  Result Value Ref Range   WBC 7.1 4.0 - 10.5 K/uL   RBC 4.57 3.87 - 5.11 MIL/uL   Hemoglobin 13.7 12.0 - 15.0 g/dL   HCT 40.7 36.0 - 46.0 %   MCV 89.1 78.0 - 100.0 fL   MCH 30.0 26.0 - 34.0 pg   MCHC 33.7 30.0 - 36.0 g/dL   RDW 12.5 11.5 - 15.5 %   Platelets 206 150 - 400 K/uL   Neutrophils Relative % 69 43 -  77 %   Neutro Abs 4.9 1.7 - 7.7 K/uL   Lymphocytes Relative 21 12 - 46 %   Lymphs Abs 1.5 0.7 - 4.0 K/uL   Monocytes Relative 8 3 - 12 %   Monocytes Absolute 0.5 0.1 - 1.0 K/uL   Eosinophils Relative 2 0 - 5 %   Eosinophils Absolute 0.2 0.0 - 0.7 K/uL   Basophils Relative 0 0 - 1 %   Basophils Absolute 0.0 0.0 - 0.1 K/uL  Basic metabolic panel  Result Value Ref Range   Sodium 136 135 - 145 mEq/L   Potassium 3.6 3.5 - 5.1 mEq/L   Chloride 105 96 - 112 mEq/L   CO2 25 19 - 32 mEq/L   Glucose, Bld 100 (H) 70 - 99 mg/dL   BUN 14 6 - 23 mg/dL   Creatinine, Ser 0.81 0.50 - 1.10 mg/dL   Calcium 8.5 8.4 - 10.5 mg/dL   GFR calc non Af Amer 86 (L) >90 mL/min   GFR calc Af Amer >90 >90 mL/min  Pregnancy, urine POC  Result Value Ref Range   Preg Test, Ur NEGATIVE NEGATIVE     EKG/XRAY:   Primary read interpreted by Dr. Marin Comment at [facility].   ASSESSMENT/PLAN: Encounter Diagnoses  Name Primary?  Marland Kitchen Anxiety state Yes  . Insomnia    Mrs. Fausto Skillern is a pleasant 49 year old female who is currently in a transitional stage of her life. 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  She is under the care of Dr. Roddie Mc for her insomnia. She just needs a little bit of help for some of the breakthrough insomnia she's having considering all the changes that are  About to  happen in her life in the next few weeks. The [place] controlled substance database was pulled, there are no illegal activities. She was prescribed alprazolam by mouth daily at bedtime when necessary Follow-up as needed   Gross  sideeffects, risk and benefits, and alternatives of medications d/w patient. Patient is aware that all medications have potential sideeffects and we are unable to predict every sideeffect or drug-drug interaction that may occur.  Lacora Folmer, [place], DO 10/29/2014 5:37 PM

## 2014-11-05 ENCOUNTER — Other Ambulatory Visit: Payer: Self-pay | Admitting: Neurology

## 2014-11-05 ENCOUNTER — Telehealth: Payer: Self-pay

## 2014-11-05 MED ORDER — AMPHETAMINE-DEXTROAMPHET ER 15 MG PO CP24: 15.0000 mg | ORAL_CAPSULE | Freq: Every day | ORAL | Status: DC

## 2014-11-05 NOTE — Telephone Encounter (Signed)
Spoke to pt and informed her that her RX was available at the front desk. PT verbalized understanding.

## 2014-11-05 NOTE — Telephone Encounter (Signed)
Request entered, forwarded to provider for approval.  

## 2014-11-05 NOTE — Telephone Encounter (Signed)
Patient call requesting for amphetamine-dextroamphetamine (ADDERALL XR) 15 MG 24 hr capsule . Patient advised RX will be ready within 24 hours unless otherwise informed by RN.

## 2014-11-07 ENCOUNTER — Encounter: Payer: Self-pay | Admitting: Internal Medicine

## 2014-11-07 ENCOUNTER — Ambulatory Visit (INDEPENDENT_AMBULATORY_CARE_PROVIDER_SITE_OTHER): Payer: Managed Care, Other (non HMO) | Admitting: Internal Medicine

## 2014-11-07 VITALS — BP 110/72 | HR 97 | Temp 98.0°F | Ht 59.0 in | Wt 111.5 lb

## 2014-11-07 DIAGNOSIS — Z789 Other specified health status: Secondary | ICD-10-CM

## 2014-11-07 DIAGNOSIS — Z9229 Personal history of other drug therapy: Secondary | ICD-10-CM

## 2014-11-07 DIAGNOSIS — Z0000 Encounter for general adult medical examination without abnormal findings: Secondary | ICD-10-CM | POA: Diagnosis not present

## 2014-11-07 DIAGNOSIS — Z23 Encounter for immunization: Secondary | ICD-10-CM

## 2014-11-07 DIAGNOSIS — Z8619 Personal history of other infectious and parasitic diseases: Secondary | ICD-10-CM | POA: Diagnosis not present

## 2014-11-07 DIAGNOSIS — K9041 Non-celiac gluten sensitivity: Secondary | ICD-10-CM | POA: Insufficient documentation

## 2014-11-07 MED ORDER — MELOXICAM 15 MG PO TABS: 15.0000 mg | ORAL_TABLET | Freq: Every day | ORAL | Status: DC | PRN

## 2014-11-07 MED ORDER — VALACYCLOVIR HCL 1 G PO TABS: 2000.0000 mg | ORAL_TABLET | Freq: Two times a day (BID) | ORAL | Status: DC

## 2014-11-07 NOTE — Progress Notes (Signed)
 Subjective:    Patient ID: Susan Fisher, female    DOB: 08/26/1965, 49 y.o.   MRN: [id number]  HPI  Patient here for establishing care with new PCP - last saw [facility] 12/2011 patient is here today for annual physical. Patient feels well and has no complaints. 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 Sleep disorder     Dr Dohmier, chronic and severe insomnia  . Gluten intolerance 2011    Dr Franessa Hall  . Chronic insomnia   . Depression   . Anxiety   . Seasonal allergic rhinitis    Family History  Problem Relation Age of Onset  . Anxiety disorder Mother   . Depression Mother   . Hypertension Mother   . Stroke Mother 65  . Heart failure Mother   . Heart attack Mother 60  . Breast cancer Mother 50  . COPD Mother     smoker  . Heart attack Paternal Grandfather 70  . Pulmonary embolism Father 65    post rotator cuff surgery  . Tremor Sister   . Tremor Sister    History  Substance Use Topics  . Smoking status: Never Smoker   . Smokeless tobacco: Never Used  . Alcohol Use: 4.2 oz/week    7 Glasses of wine per week     Comment:  socially    Review of Systems  Constitutional: Negative for fatigue and unexpected weight change.  Respiratory: Negative for cough, shortness of breath and wheezing.   Cardiovascular: Negative for chest pain, palpitations and leg swelling.  Gastrointestinal: Negative for nausea, abdominal pain and diarrhea.  Neurological: Negative for dizziness, weakness, light-headedness and headaches.  Psychiatric/Behavioral: Positive for sleep disturbance (chronic, controlled with meds). Negative for dysphoric mood. The patient is not nervous/anxious.   All other systems reviewed and are negative.      Objective:    Physical Exam  Constitutional: She is oriented to person, place, and time. She appears well-developed and well-nourished. No  distress.  HENT:  Head: Normocephalic and atraumatic.  Right Ear: External ear normal.  Left Ear: External ear normal.  Nose: Nose normal.  Mouth/Throat: Oropharynx is clear and moist. No oropharyngeal exudate.  Eyes: EOM are normal. Pupils are equal, round, and reactive to light. Right eye exhibits no discharge. Left eye exhibits no discharge. No scleral icterus.  Wears corrective lenses  Neck: Normal range of motion. Neck supple. No JVD present. No tracheal deviation present. No thyromegaly present.  Cardiovascular: Normal rate, regular rhythm, normal heart sounds and intact distal pulses.  Exam reveals no friction rub.   No murmur heard. Pulmonary/Chest: Effort normal and breath sounds normal. No respiratory distress. She has no wheezes. She has no rales. She exhibits no tenderness.  Abdominal: Soft. Bowel sounds are normal. She exhibits no distension and no mass. There is no tenderness. There is no rebound and no guarding.  Genitourinary:  Defer to gyn  Musculoskeletal: Normal range of motion.  No gross deformities  Lymphadenopathy:    She has no cervical adenopathy.  Neurological: She is alert and oriented to person, place, and time. She has normal reflexes. No cranial nerve deficit.  Skin: Skin is warm and dry. No rash noted. She is not diaphoretic. No erythema.  Psychiatric: She has a normal mood and affect. Her behavior is normal. Judgment and thought content normal.  Nursing note and vitals reviewed.   BP   110/72 mmHg  Pulse 97  Temp(Src) 98 F (36.7 C) (Oral)  Ht 4' 11" (1.499 m)  Wt 111 lb 8 oz (50.576 kg)  BMI 22.51 kg/m2  SpO2 98% Wt Readings from Last 3 Encounters:  11/07/14 111 lb 8 oz (50.576 kg)  10/23/14 114 lb (51.71 kg)  09/10/14 113 lb (51.256 kg)    Lab Results  Component Value Date   WBC 7.1 12/14/2012   HGB 13.7 12/14/2012   HCT 40.7 12/14/2012   PLT 206 12/14/2012   GLUCOSE 100* 12/14/2012   CHOL 150 02/01/2012   TRIG 42.0 02/01/2012   HDL  63.20 02/01/2012   LDLCALC 78 02/01/2012   ALT 13 02/01/2012   AST 17 02/01/2012   NA 136 12/14/2012   K 3.6 12/14/2012   CL 105 12/14/2012   CREATININE 0.81 12/14/2012   BUN 14 12/14/2012   CO2 25 12/14/2012   TSH 2.44 02/01/2012    Mm Digital Screening Bilateral  12/03/2013   CLINICAL DATA:  Screening.  EXAM: DIGITAL SCREENING BILATERAL MAMMOGRAM WITH CAD  COMPARISON:  Previous exam(s).  ACR Breast Density Category d: The breast tissue is extremely dense, which lowers the sensitivity of mammography.  FINDINGS: There are no findings suspicious for malignancy. Images were processed with CAD.  IMPRESSION: No mammographic evidence of malignancy. A result letter of this screening mammogram will be mailed directly to the patient.  RECOMMENDATION: Screening mammogram in one year. (Code:SM-B-01Y)  BI-RADS CATEGORY  1: Negative.   Electronically Signed   By: Drew  Davis M.D.   On: 12/03/2013 14:35       Assessment & Plan:   CPX/z00.00 - Patient has been counseled on age-appropriate routine health concerns for screening and prevention. These are reviewed and up-to-date. Immunizations are up-to-date or declined. Labs and ECG 2008/2013 reviewed -PA-C, never A. Fib.  Check titers for prior immunity to varicella and MMR, immunize if insufficient titer of immunoglobulins Initiate hep B series today Forms completed for school enrollment as requested  Problem List Items Addressed This Visit    None    Visit Diagnoses    Routine general medical examination at a health care facility    -  Primary    Relevant Orders    Basic metabolic panel    CBC with Differential/Platelet    Hepatic function panel    Lipid panel    TSH    Urinalysis, Routine w reflex microscopic (not at [facility])    History of varicella as a child        Relevant Orders    Varicella zoster antibody, IgM    Varicella zoster antibody, IgG    History of measles, mumps, and rubella vaccination        Relevant Orders     Measles/Mumps/Rubella Immunity         , MD   

## 2014-11-07 NOTE — Addendum Note (Signed)
Addended by: Lowella Dandy on: 11/07/2014 03:12 PM   Modules accepted: Orders

## 2014-11-07 NOTE — Patient Instructions (Addendum)
It was good to see you today.  We have reviewed your prior records including labs and tests today  Health Maintenance reviewed - start hepatitis B immunization series today  -all other recommended immunizations and age-appropriate screenings are up-to-date.  Test(s) ordered today. Return tomorrow morning at 730 to our lab when you are fasting. Your results will be released to [place] (or called to you) after review, usually within 72hours after test completion. If any changes need to be made, you will be notified at that same time.  Medications reviewed and updated, no changes recommended at this time. Refill on medication(s) as discussed today.  Please schedule followup in 12-24 months for annual exam and labs, call sooner if problems.  Health Maintenance Adopting a healthy lifestyle and getting preventive care can go a long way to promote health and wellness. Talk with your health care provider about what schedule of regular examinations is right for you. This is a good chance for you to check in with your provider about disease prevention and staying healthy. In between checkups, there are plenty of things you can do on your own. Experts have done a lot of research about which lifestyle changes and preventive measures are most likely to keep you healthy. Ask your health care provider for more information. WEIGHT AND DIET  Eat a healthy diet  Be sure to include plenty of vegetables, fruits, low-fat dairy products, and lean protein.  Do not eat a lot of foods high in solid fats, added sugars, or salt.  Get regular exercise. This is one of the most important things you can do for your health.  Most adults should exercise for at least 150 minutes each week. The exercise should increase your heart rate and make you sweat (moderate-intensity exercise).  Most adults should also do strengthening exercises at least twice a week. This is in addition to the moderate-intensity exercise.  Maintain  a healthy weight  Body mass index (BMI) is a measurement that can be used to identify possible weight problems. It estimates body fat based on height and weight. Your health care provider can help determine your BMI and help you achieve or maintain a healthy weight.  For females 10 years of age and older:   A BMI below 18.5 is considered underweight.  A BMI of 18.5 to 24.9 is normal.  A BMI of 25 to 29.9 is considered overweight.  A BMI of 30 and above is considered obese.  Watch levels of cholesterol and blood lipids  You should start having your blood tested for lipids and cholesterol at 48 years of age, then have this test every 5 years.  You may need to have your cholesterol levels checked more often if:  Your lipid or cholesterol levels are high.  You are older than 48 years of age.  You are at high risk for heart disease.  CANCER SCREENING   Lung Cancer  Lung cancer screening is recommended for adults 54-61 years old who are at high risk for lung cancer because of a history of smoking.  A yearly low-dose CT scan of the lungs is recommended for people who:  Currently smoke.  Have quit within the past 15 years.  Have at least a 30-pack-year history of smoking. A pack year is smoking an average of one pack of cigarettes a day for 1 year.  Yearly screening should continue until it has been 15 years since you quit.  Yearly screening should stop if you develop a health  problem that would prevent you from having lung cancer treatment.  Breast Cancer  Practice breast self-awareness. This means understanding how your breasts normally appear and feel.  It also means doing regular breast self-exams. Let your health care provider know about any changes, no matter how small.  If you are in your 20s or 30s, you should have a clinical breast exam (CBE) by a health care provider every 1-3 years as part of a regular health exam.  If you are 19 or older, have a CBE every  year. Also consider having a breast X-ray (mammogram) every year.  If you have a family history of breast cancer, talk to your health care provider about genetic screening.  If you are at high risk for breast cancer, talk to your health care provider about having an MRI and a mammogram every year.  Breast cancer gene (BRCA) assessment is recommended for women who have family members with BRCA-related cancers. BRCA-related cancers include:  Breast.  Ovarian.  Tubal.  Peritoneal cancers.  Results of the assessment will determine the need for genetic counseling and BRCA1 and BRCA2 testing. Cervical Cancer Routine pelvic examinations to screen for cervical cancer are no longer recommended for nonpregnant women who are considered low risk for cancer of the pelvic organs (ovaries, uterus, and vagina) and who do not have symptoms. A pelvic examination may be necessary if you have symptoms including those associated with pelvic infections. Ask your health care provider if a screening pelvic exam is right for you.   The Pap test is the screening test for cervical cancer for women who are considered at risk.  If you had a hysterectomy for a problem that was not cancer or a condition that could lead to cancer, then you no longer need Pap tests.  If you are older than 65 years, and you have had normal Pap tests for the past 10 years, you no longer need to have Pap tests.  If you have had past treatment for cervical cancer or a condition that could lead to cancer, you need Pap tests and screening for cancer for at least 20 years after your treatment.  If you no longer get a Pap test, assess your risk factors if they change (such as having a new sexual partner). This can affect whether you should start being screened again.  Some women have medical problems that increase their chance of getting cervical cancer. If this is the case for you, your health care provider may recommend more frequent  screening and Pap tests.  The human papillomavirus (HPV) test is another test that may be used for cervical cancer screening. The HPV test looks for the virus that can cause cell changes in the cervix. The cells collected during the Pap test can be tested for HPV.  The HPV test can be used to screen women 15 years of age and older. Getting tested for HPV can extend the interval between normal Pap tests from three to five years.  An HPV test also should be used to screen women of any age who have unclear Pap test results.  After 48 years of age, women should have HPV testing as often as Pap tests.  Colorectal Cancer  This type of cancer can be detected and often prevented.  Routine colorectal cancer screening usually begins at 48 years of age and continues through 47 years of age.  Your health care provider may recommend screening at an earlier age if you have risk factors for  colon cancer.  Your health care provider may also recommend using home test kits to check for hidden blood in the stool.  A small camera at the end of a tube can be used to examine your colon directly (sigmoidoscopy or colonoscopy). This is done to check for the earliest forms of colorectal cancer.  Routine screening usually begins at age 36.  Direct examination of the colon should be repeated every 5-10 years through 48 years of age. However, you may need to be screened more often if early forms of precancerous polyps or small growths are found. Skin Cancer  Check your skin from head to toe regularly.  Tell your health care provider about any new moles or changes in moles, especially if there is a change in a mole's shape or color.  Also tell your health care provider if you have a mole that is larger than the size of a pencil eraser.  Always use sunscreen. Apply sunscreen liberally and repeatedly throughout the day.  Protect yourself by wearing long sleeves, pants, a wide-brimmed hat, and sunglasses whenever  you are outside. HEART DISEASE, DIABETES, AND HIGH BLOOD PRESSURE   Have your blood pressure checked at least every 1-2 years. High blood pressure causes heart disease and increases the risk of stroke.  If you are between 36 years and 79 years old, ask your health care provider if you should take aspirin to prevent strokes.  Have regular diabetes screenings. This involves taking a blood sample to check your fasting blood sugar level.  If you are at a normal weight and have a low risk for diabetes, have this test once every three years after 48 years of age.  If you are overweight and have a high risk for diabetes, consider being tested at a younger age or more often. PREVENTING INFECTION  Hepatitis B  If you have a higher risk for hepatitis B, you should be screened for this virus. You are considered at high risk for hepatitis B if:  You were born in a country where hepatitis B is common. Ask your health care provider which countries are considered high risk.  Your parents were born in a high-risk country, and you have not been immunized against hepatitis B (hepatitis B vaccine).  You have HIV or AIDS.  You use needles to inject street drugs.  You live with someone who has hepatitis B.  You have had sex with someone who has hepatitis B.  You get hemodialysis treatment.  You take certain medicines for conditions, including cancer, organ transplantation, and autoimmune conditions. Hepatitis C  Blood testing is recommended for:  Everyone born from [birth year] through 1964.  Anyone with known risk factors for hepatitis C. Sexually transmitted infections (STIs)  You should be screened for sexually transmitted infections (STIs) including gonorrhea and chlamydia if:  You are sexually active and are younger than 48 years of age.  You are older than 48 years of age and your health care provider tells you that you are at risk for this type of infection.  Your sexual activity has changed  since you were last screened and you are at an increased risk for chlamydia or gonorrhea. Ask your health care provider if you are at risk.  If you do not have HIV, but are at risk, it may be recommended that you take a prescription medicine daily to prevent HIV infection. This is called pre-exposure prophylaxis (PrEP). You are considered at risk if:  You are sexually active and do  not regularly use condoms or know the HIV status of your partner(s).  You take drugs by injection.  You are sexually active with a partner who has HIV. Talk with your health care provider about whether you are at high risk of being infected with HIV. If you choose to begin PrEP, you should first be tested for HIV. You should then be tested every 3 months for as long as you are taking PrEP.  PREGNANCY   If you are premenopausal and you may become pregnant, ask your health care provider about preconception counseling.  If you may become pregnant, take 400 to 800 micrograms (mcg) of folic acid every day.  If you want to prevent pregnancy, talk to your health care provider about birth control (contraception). OSTEOPOROSIS AND MENOPAUSE   Osteoporosis is a disease in which the bones lose minerals and strength with aging. This can result in serious bone fractures. Your risk for osteoporosis can be identified using a bone density scan.  If you are 44 years of age or older, or if you are at risk for osteoporosis and fractures, ask your health care provider if you should be screened.  Ask your health care provider whether you should take a calcium or vitamin D supplement to lower your risk for osteoporosis.  Menopause may have certain physical symptoms and risks.  Hormone replacement therapy may reduce some of these symptoms and risks. Talk to your health care provider about whether hormone replacement therapy is right for you.  HOME CARE INSTRUCTIONS   Schedule regular health, dental, and eye exams.  Stay current  with your immunizations.   Do not use any tobacco products including cigarettes, chewing tobacco, or electronic cigarettes.  If you are pregnant, do not drink alcohol.  If you are breastfeeding, limit how much and how often you drink alcohol.  Limit alcohol intake to no more than 1 drink per day for nonpregnant women. One drink equals 12 ounces of beer, 5 ounces of wine, or 1 ounces of hard liquor.  Do not use street drugs.  Do not share needles.  Ask your health care provider for help if you need support or information about quitting drugs.  Tell your health care provider if you often feel depressed.  Tell your health care provider if you have ever been abused or do not feel safe at home. Document Released: 10/26/2010 Document Revised: 08/27/2013 Document Reviewed: 03/14/2013 [facility] Patient Information 2015 [place], [place]. This information is not intended to replace advice given to you by your health care provider. Make sure you discuss any questions you have with your health care provider.

## 2014-11-07 NOTE — Progress Notes (Signed)
Pre visit review using our clinic review tool, if applicable. No additional management support is needed unless otherwise documented below in the visit note. 

## 2014-11-08 ENCOUNTER — Other Ambulatory Visit (INDEPENDENT_AMBULATORY_CARE_PROVIDER_SITE_OTHER): Payer: Managed Care, Other (non HMO)

## 2014-11-08 DIAGNOSIS — Z0000 Encounter for general adult medical examination without abnormal findings: Secondary | ICD-10-CM | POA: Diagnosis not present

## 2014-11-08 DIAGNOSIS — Z8619 Personal history of other infectious and parasitic diseases: Secondary | ICD-10-CM

## 2014-11-08 DIAGNOSIS — Z9229 Personal history of other drug therapy: Secondary | ICD-10-CM

## 2014-11-08 LAB — URINALYSIS, ROUTINE W REFLEX MICROSCOPIC
Bilirubin Urine: NEGATIVE
Hgb urine dipstick: NEGATIVE
KETONES UR: 15 — AB
Leukocytes, UA: NEGATIVE
Nitrite: NEGATIVE
PH: 6.5 (ref 5.0–8.0)
RBC / HPF: NONE SEEN (ref 0–?)
Specific Gravity, Urine: 1.02 (ref 1.000–1.030)
Total Protein, Urine: NEGATIVE
Urine Glucose: NEGATIVE
Urobilinogen, UA: 0.2 (ref 0.0–1.0)

## 2014-11-08 LAB — BASIC METABOLIC PANEL
BUN: 12 mg/dL (ref 6–23)
CALCIUM: 8.8 mg/dL (ref 8.4–10.5)
CO2: 26 mEq/L (ref 19–32)
CREATININE: 0.82 mg/dL (ref 0.40–1.20)
Chloride: 107 mEq/L (ref 96–112)
GFR: 79.06 mL/min (ref >60.00)
GLUCOSE: 74 mg/dL (ref 70–99)
POTASSIUM: 4.6 meq/L (ref 3.5–5.1)
SODIUM: 141 meq/L (ref 135–145)

## 2014-11-08 LAB — CBC WITH DIFFERENTIAL/PLATELET
BASOS PCT: 1.2 % (ref 0.0–3.0)
Basophils Absolute: 0.1 10*3/uL (ref 0.0–0.1)
EOS PCT: 7.7 % — AB (ref 0.0–5.0)
Eosinophils Absolute: 0.5 10*3/uL (ref 0.0–0.7)
HCT: 45.1 % (ref 36.0–46.0)
Hemoglobin: 15 g/dL (ref 12.0–15.0)
LYMPHS PCT: 37.1 % (ref 12.0–46.0)
Lymphs Abs: 2.2 10*3/uL (ref 0.7–4.0)
MCHC: 33.1 g/dL (ref 30.0–36.0)
MCV: 91.5 fl (ref 78.0–100.0)
Monocytes Absolute: 0.4 10*3/uL (ref 0.1–1.0)
Monocytes Relative: 7 % (ref 3.0–12.0)
NEUTROS ABS: 2.8 10*3/uL (ref 1.4–7.7)
Neutrophils Relative %: 47 % (ref 43.0–77.0)
Platelets: 217 10*3/uL (ref 150.0–400.0)
RBC: 4.93 Mil/uL (ref 3.87–5.11)
RDW: 13.1 % (ref 11.5–15.5)
WBC: 5.9 10*3/uL (ref 4.0–10.5)

## 2014-11-08 LAB — HEPATIC FUNCTION PANEL
ALBUMIN: 4 g/dL (ref 3.5–5.2)
ALT: 19 U/L (ref 0–35)
AST: 38 U/L — ABNORMAL HIGH (ref 0–37)
Alkaline Phosphatase: 46 U/L (ref 39–117)
BILIRUBIN TOTAL: 0.6 mg/dL (ref 0.2–1.2)
Bilirubin, Direct: 0.1 mg/dL (ref 0.0–0.3)
Total Protein: 6.5 g/dL (ref 6.0–8.3)

## 2014-11-08 LAB — LIPID PANEL
CHOLESTEROL: 159 mg/dL (ref 0–200)
HDL: 72.9 mg/dL (ref >39.00)
LDL CALC: 73 mg/dL (ref 0–99)
NONHDL: 86.1
Total CHOL/HDL Ratio: 2
Triglycerides: 64 mg/dL (ref 0.0–149.0)
VLDL: 12.8 mg/dL (ref 0.0–40.0)

## 2014-11-08 LAB — TSH: TSH: 2.7 u[IU]/mL (ref 0.35–4.50)

## 2014-11-11 LAB — MEASLES/MUMPS/RUBELLA IMMUNITY
Mumps IgG: 34.2 AU/mL — ABNORMAL HIGH (ref <9.00)
RUBEOLA IGG: 173 [AU]/ml — AB (ref <25.00)
Rubella: 23.7 Index — ABNORMAL HIGH (ref <0.90)

## 2014-11-11 LAB — VARICELLA ZOSTER ANTIBODY, IGG: VARICELLA IGG: 265.9 {index} — AB (ref <135.00)

## 2014-11-12 ENCOUNTER — Ambulatory Visit (INDEPENDENT_AMBULATORY_CARE_PROVIDER_SITE_OTHER): Payer: Managed Care, Other (non HMO) | Admitting: Geriatric Medicine

## 2014-11-12 DIAGNOSIS — Z111 Encounter for screening for respiratory tuberculosis: Secondary | ICD-10-CM | POA: Diagnosis not present

## 2014-11-12 LAB — VARICELLA ZOSTER ANTIBODY, IGM: VARICELLA ZOSTER AB IGM: 0.07 {ISR} (ref <0.91)

## 2014-11-12 LAB — MUMPS ANTIBODY, IGM

## 2014-11-13 LAB — RUBEOLA ANTIBODY, IGM: Measles Ab IgM, IFA: <1:20 {titer}

## 2014-11-14 ENCOUNTER — Ambulatory Visit: Payer: Managed Care, Other (non HMO)

## 2014-11-14 DIAGNOSIS — Z111 Encounter for screening for respiratory tuberculosis: Secondary | ICD-10-CM

## 2014-11-14 LAB — TB SKIN TEST
Induration: 0 mm
TB SKIN TEST: NEGATIVE

## 2014-11-26 ENCOUNTER — Ambulatory Visit (INDEPENDENT_AMBULATORY_CARE_PROVIDER_SITE_OTHER): Payer: Managed Care, Other (non HMO) | Admitting: Adult Health

## 2014-11-26 ENCOUNTER — Encounter: Payer: Self-pay | Admitting: Adult Health

## 2014-11-26 VITALS — BP 118/80 | HR 85 | Ht 59.0 in | Wt 110.0 lb

## 2014-11-26 DIAGNOSIS — G4719 Other hypersomnia: Secondary | ICD-10-CM

## 2014-11-26 DIAGNOSIS — G4700 Insomnia, unspecified: Secondary | ICD-10-CM

## 2014-11-26 NOTE — Progress Notes (Signed)
PATIENT: Susan Fisher DOB: Mar 31, 1966  REASON FOR VISIT: follow up- insomnia, excessive daytime sleepniess HISTORY FROM: patient  HISTORY OF PRESENT ILLNESS: Susan Fisher is a 49 year old female with a history of insomnia and excessive daytime sleepiness. She returns today for follow-up. At the last visit the patient's Adderall was increased to 15mg  she states that this has been working well. She continues to take Seroquel at bedtime. She states in the last couple weeks she's been under additional stress due to starting school. She is enrolled at [facility] for the physical therapy program. She feels that her stress has caused her insomnia to worsen. She states that she usually tries to go to bed between 10 and 11 PM and arises at 6:30 AM. She denies any new symptoms. She returns today for an evaluation.  HISTORY 05/22/14: Susan Fisher is a 49 year old female with a history of insomnia and excessive daytime sleepiness. She returns today for follow-up. She is currently taking Seroquel and Adderall and tolerating those well. She states that she is sleeping well at night. She states that she will have insomnia sporadically. On average she gets about 6-7 hours of sleep. She states that the adderall worked well at first but now she states that she is getting sleepy after lunch. She denies falling asleep while at work but she states that she is suffering to stay awake. Patient states that since the last visit she did have knee surgery other than that no other medical history. Epworth is 8 was previously 5 and fatigue severity score is 40 was previously 29.   HISTORY 09/19/13 [facility]): Susan Fisher is a 49 y.o. female Is seen here as a prolonged revisit from Dr. Linna Darner , follow up for medicine refills to control insomnia. Last visit 10-10-2011. This patient has a history of excessive daytime sleepiness, chronic insomnia and acid reflux disease. She is treated for depression and Follow up with a therapist. She  has started seroquel to control her insomnia and has been doing wellfor 3 years. She also had in her last visit reported that her gastrointestinal symptoms resolved after she change to a gluten-free diet.The patient has nausea history is currently not gainfully employed, she has never used tobacco products she drinks 2 caffeinated beverages per day and up to 4 beverages of alcohol on a week and. 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 Her mother died of a stroke. Also significant for heart disease comes the COPD and depression in her mother. Her mother had essential tremor as have 2 of her sisters. I will refill today Seroquel 25 minute 90 day supply and 10 mg tablets of generic Adderall.   REVIEW OF SYSTEMS: Out of a complete 14 system review of symptoms, the patient complains only of the following symptoms, and all other reviewed systems are negative.  Insomnia, daytime sleepiness, neck pain  ALLERGIES: Allergies  Allergen Reactions  . Gluten Meal Other (See Comments)    Gluten intolerant  . Venlafaxine     ineffective    HOME MEDICATIONS: Outpatient Prescriptions Prior to Visit  Medication Sig Dispense Refill  . ALPRAZolam (XANAX) 0.5 MG tablet Take 0.5 tablets (0.25 mg total) by mouth at bedtime as needed for sleep. 20 tablet 0  . amphetamine-dextroamphetamine (ADDERALL XR) 15 MG 24 hr capsule Take 1 capsule by mouth daily. 30 capsule 0  . ibuprofen (ADVIL,MOTRIN) 200 MG tablet Take 200 mg by mouth  every 8 (eight) hours as needed for pain.    . meloxicam (MOBIC) 15 MG tablet Take 1 tablet (15 mg total) by mouth daily as needed for pain. 30 tablet 1  . QUEtiapine (SEROQUEL) 25 MG tablet Take 1 tablet (25 mg total) by mouth at bedtime. 90 tablet 0  . valACYclovir (VALTREX) 1000 MG tablet Take 2 tablets (2,000 mg total) by mouth 2 (two) times daily. X 1 day as needed for fever blister outbreak 4  tablet 1   No facility-administered medications prior to visit.    PAST MEDICAL HISTORY: Past Medical History  Diagnosis Date  . Sleep disorder     Dr Beacher May, chronic and severe insomnia  . Gluten intolerance 2011    Dr Marquette Saa  . Chronic insomnia   . Depression   . Anxiety   . Seasonal allergic rhinitis     PAST SURGICAL HISTORY: Past Surgical History  Procedure Laterality Date  . Upper gastrointestinal endoscopy  2008    neg; Dr Watt Climes  . [facility] gastrointestinal endoscopy  2009     [facility]  . Knee arthroscopy Left 1995  . Anterior cruciate ligament repair Right 08/2013    [facility]    FAMILY HISTORY: Family History  Problem Relation Age of Onset  . Anxiety disorder Mother   . Depression Mother   . Hypertension Mother   . Stroke Mother 37  . Heart failure Mother   . Heart attack Mother 97  . Breast cancer Mother 37  . COPD Mother     smoker  . Heart attack Paternal Grandfather 38  . Pulmonary embolism Father 55    post rotator cuff surgery  . Tremor Sister   . Tremor Sister     SOCIAL HISTORY: History   Social History  . Marital Status: Single    Spouse Name: N/A  . Number of Children: 0  . Years of Education: College   Occupational History  .  [employer]   Social History Main Topics  . Smoking status: Never Smoker   . Smokeless tobacco: Never Used  . Alcohol Use: 4.2 oz/week    7 Glasses of wine per week     Comment:  socially  . Drug Use: No  . Sexual Activity: Not on file   Other Topics Concern  . Not on file   Social History Narrative   Patient is single and lives alone.     Patient has a college education.   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 -    planning to enroll with PT asst school at [employer] [facility] fall 2016   Patient drinks two caffeine drinks daily.   Patient is right-handed.            PHYSICAL EXAM  Filed Vitals:   11/26/14 0857  BP: 118/80  Pulse: 85  Height: 4\' 11"  (1.499 m)  Weight: 110 lb  (49.896 kg)   Body mass index is 22.21 kg/(m^2).  Generalized: Well developed, in no acute distress   Neurological examination  Mentation: Alert oriented to time, place, history taking. Follows all commands speech and language fluent Cranial nerve II-XII: Pupils were equal round reactive to light. Extraocular movements were full, visual field were full on confrontational test. Facial sensation and strength were normal. Uvula tongue midline. Head turning and shoulder shrug  were normal and symmetric. Motor: The motor testing reveals 5 over 5 strength of all 4 extremities. Good symmetric motor tone is noted throughout.  Sensory: Sensory testing  is intact to soft touch on all 4 extremities. No evidence of extinction is noted.  Coordination: Cerebellar testing reveals good finger-nose-finger and heel-to-shin bilaterally.  Gait and station: Gait is normal. Tandem gait is normal. Romberg is negative. No drift is seen.  Reflexes: Deep tendon reflexes are symmetric and normal bilaterally.   DIAGNOSTIC DATA (LABS, IMAGING, TESTING) - I reviewed patient records, labs, notes, testing and imaging myself where available.  Lab Results  Component Value Date   WBC 5.9 11/08/2014   HGB 15.0 11/08/2014   HCT 45.1 11/08/2014   MCV 91.5 11/08/2014   PLT 217.0 11/08/2014      Component Value Date/Time   NA 141 11/08/2014 0738   K 4.6 11/08/2014 0738   CL 107 11/08/2014 0738   CO2 26 11/08/2014 0738   GLUCOSE 74 11/08/2014 0738   BUN 12 11/08/2014 0738   CREATININE 0.82 11/08/2014 0738   CALCIUM 8.8 11/08/2014 0738   PROT 6.5 11/08/2014 0738   ALBUMIN 4.0 11/08/2014 0738   AST 38* 11/08/2014 0738   ALT 19 11/08/2014 0738   ALKPHOS 46 11/08/2014 0738   BILITOT 0.6 11/08/2014 0738   GFRNONAA 86* 12/14/2012 0250   GFRAA >90 12/14/2012 0250   Lab Results  Component Value Date   CHOL 159 11/08/2014   HDL 72.90 11/08/2014   LDLCALC 73 11/08/2014   TRIG 64.0 11/08/2014   CHOLHDL 2 11/08/2014        ASSESSMENT AND PLAN 49 y.o. year old female  has a past medical history of Sleep disorder; Gluten intolerance (2011); Chronic insomnia; Depression; Anxiety; and Seasonal allergic rhinitis. here with:  1. Insomnia 2. Excessive daytime sleepiness  Overall the patient is doing well. She will continue Adderall and Seroquel. The patient has been under added stress therefore causing her insomnia to worsen. I have advised the patient that once she adjusts to her new schedule her insomnia might improve. If she does not see any improvement she should let us know and her medication can be adjusted. The patient verbalized understanding. She will follow-up in 6 months or sooner if needed.     Ward Givens, MSN, NP-C 11/26/2014, 9:01 AM [facility] [address] [place], [place] [address] [phone]  Note: This document was prepared with digital dictation and possible smart phrase technology. Any transcriptional errors that result from this process are unintentional.

## 2014-11-26 NOTE — Patient Instructions (Signed)
Continue Adderall and Seroquel. If your symptoms worsen or you develop new symptoms please let us know.

## 2014-12-03 ENCOUNTER — Ambulatory Visit (INDEPENDENT_AMBULATORY_CARE_PROVIDER_SITE_OTHER): Payer: Managed Care, Other (non HMO)

## 2014-12-03 DIAGNOSIS — Z111 Encounter for screening for respiratory tuberculosis: Secondary | ICD-10-CM | POA: Diagnosis not present

## 2014-12-03 DIAGNOSIS — Z23 Encounter for immunization: Secondary | ICD-10-CM

## 2014-12-03 DIAGNOSIS — Z299 Encounter for prophylactic measures, unspecified: Secondary | ICD-10-CM

## 2014-12-03 MED ORDER — TUBERCULIN PPD 5 UNIT/0.1ML ID SOLN: 5.0000 [IU] | Freq: Once | INTRADERMAL | Status: DC

## 2014-12-05 LAB — TB SKIN TEST
Induration: 0 mm
TB Skin Test: NEGATIVE

## 2014-12-23 ENCOUNTER — Other Ambulatory Visit: Payer: Self-pay | Admitting: Neurology

## 2014-12-23 MED ORDER — AMPHETAMINE-DEXTROAMPHET ER 15 MG PO CP24: 15.0000 mg | ORAL_CAPSULE | Freq: Every day | ORAL | Status: DC

## 2014-12-23 NOTE — Telephone Encounter (Signed)
Request entered, forwarded to provider for approval.  

## 2014-12-23 NOTE — Telephone Encounter (Signed)
Patient called requesting refill for amphetamine-dextroamphetamine (ADDERALL XR) 15 MG 24 hr capsule . Patient advised RX will be ready within 24 hours unless otherwise informed by RN

## 2014-12-24 ENCOUNTER — Telehealth: Payer: Self-pay

## 2014-12-24 NOTE — Telephone Encounter (Signed)
Left message on cell (per DPR) that pt's RX is ready for pick up at the front desk.

## 2015-01-01 ENCOUNTER — Encounter: Payer: Self-pay | Admitting: Internal Medicine

## 2015-01-20 ENCOUNTER — Other Ambulatory Visit: Payer: Self-pay | Admitting: Neurology

## 2015-02-10 ENCOUNTER — Telehealth: Payer: Self-pay

## 2015-02-10 ENCOUNTER — Other Ambulatory Visit: Payer: Self-pay | Admitting: Neurology

## 2015-02-10 MED ORDER — AMPHETAMINE-DEXTROAMPHET ER 15 MG PO CP24: 15.0000 mg | ORAL_CAPSULE | Freq: Every day | ORAL | Status: DC

## 2015-02-10 NOTE — Telephone Encounter (Signed)
Request entered, forwarded to provider for approval.  

## 2015-02-10 NOTE — Telephone Encounter (Signed)
Patient is calling to order a written Rx amphetamine-dextroamphetamine 15 mg 24 hr.  Thanks!

## 2015-02-10 NOTE — Telephone Encounter (Signed)
Pt returned call. Was told rx was ready for pick up.

## 2015-02-10 NOTE — Telephone Encounter (Signed)
Pt's RX is ready for pick up at the front desk. No answer, left a message asking her to call us back.

## 2015-03-06 ENCOUNTER — Ambulatory Visit (INDEPENDENT_AMBULATORY_CARE_PROVIDER_SITE_OTHER): Payer: Managed Care, Other (non HMO)

## 2015-03-06 DIAGNOSIS — Z23 Encounter for immunization: Secondary | ICD-10-CM

## 2015-04-09 ENCOUNTER — Other Ambulatory Visit: Payer: Self-pay | Admitting: Adult Health

## 2015-04-09 MED ORDER — AMPHETAMINE-DEXTROAMPHET ER 15 MG PO CP24: 15.0000 mg | ORAL_CAPSULE | Freq: Every day | ORAL | Status: DC

## 2015-04-09 NOTE — Telephone Encounter (Signed)
Patient is calling to get a written Rx for amphetamine-dextroamphetamine (ADDERALL XR) 15 MG 24 hr capsule. I advised the Rx will be ready in 24 hours unless otherwise advised by the nurse. Thank you.

## 2015-04-09 NOTE — Telephone Encounter (Signed)
Request entered, forwarded to provider for approval.  

## 2015-04-14 ENCOUNTER — Other Ambulatory Visit: Payer: Self-pay | Admitting: Adult Health

## 2015-04-14 MED ORDER — AMPHETAMINE-DEXTROAMPHET ER 15 MG PO CP24: 15.0000 mg | ORAL_CAPSULE | Freq: Every day | ORAL | Status: DC

## 2015-04-22 ENCOUNTER — Other Ambulatory Visit: Payer: Self-pay | Admitting: Neurology

## 2015-05-09 ENCOUNTER — Encounter: Payer: Self-pay | Admitting: Internal Medicine

## 2015-05-29 ENCOUNTER — Ambulatory Visit (INDEPENDENT_AMBULATORY_CARE_PROVIDER_SITE_OTHER): Payer: Managed Care, Other (non HMO) | Admitting: Adult Health

## 2015-05-29 ENCOUNTER — Encounter: Payer: Self-pay | Admitting: Adult Health

## 2015-05-29 VITALS — BP 119/82 | HR 84 | Ht 59.0 in | Wt 110.5 lb

## 2015-05-29 DIAGNOSIS — G4719 Other hypersomnia: Secondary | ICD-10-CM | POA: Diagnosis not present

## 2015-05-29 DIAGNOSIS — G4700 Insomnia, unspecified: Secondary | ICD-10-CM | POA: Diagnosis not present

## 2015-05-29 MED ORDER — AMPHETAMINE-DEXTROAMPHET ER 15 MG PO CP24: 15.0000 mg | ORAL_CAPSULE | Freq: Every day | ORAL | Status: DC

## 2015-05-29 MED ORDER — QUETIAPINE FUMARATE 25 MG PO TABS: 25.0000 mg | ORAL_TABLET | Freq: Every day | ORAL | Status: DC

## 2015-05-29 NOTE — Patient Instructions (Signed)
Continue Adderall and Seroquel. If your symptoms worsen or you develop new symptoms please let us know.   

## 2015-05-29 NOTE — Progress Notes (Signed)
PATIENT: Susan Fisher DOB: [birth year]  REASON FOR VISIT: follow up- excessive daytime sleepiness, insomnia HISTORY FROM: patient  HISTORY OF PRESENT ILLNESS: Susan Fisher is a 50 year old female with a history of excessive daytime sleepiness and insomnia. She returns today for an evaluation. The patient continues to take Adderall 15 mg daily. She feels that this is working well for her. She states that if she was not taking this medication she feels that she would be a "zombie." She feels that this is partly due to her being in school for physical therapy. She states that the program is very rigorous and takes up most of her time. She continues using Seroquel at night. She states that this works well unless someone spends the night at her house. She states that they get up during the night to use the restroom and she hears them she will be unable to fall back asleep. She denies any new neurological symptoms. She returns today for an evaluation.  HISTORY 11/26/14: Susan Fisher is a 50 year old female with a history of insomnia and excessive daytime sleepiness. She returns today for follow-up. At the last visit the patient's Adderall was increased to 15mg  she states that this has been working well. She continues to take Seroquel at bedtime. She states in the last couple weeks she's been under additional stress due to starting school. She is enrolled at [facility] for the physical therapy program. She feels that her stress has caused her insomnia to worsen. She states that she usually tries to go to bed between 10 and 11 PM and arises at 6:30 AM. She denies any new symptoms. She returns today for an evaluation.  REVIEW OF SYSTEMS: Out of a complete 14 system review of symptoms, the patient complains only of the following symptoms, and all other reviewed systems are negative.  Insomnia, daytime sleepiness, Epworth sleepiness score 10 and fatigue severity score 46  ALLERGIES: Allergies  Allergen Reactions  .  Gluten Meal Other (See Comments)    Gluten intolerant  . Venlafaxine     ineffective    HOME MEDICATIONS: Outpatient Prescriptions Prior to Visit  Medication Sig Dispense Refill  . ALPRAZolam (XANAX) 0.5 MG tablet Take 0.5 tablets (0.25 mg total) by mouth at bedtime as needed for sleep. 20 tablet 0  . ibuprofen (ADVIL,MOTRIN) 200 MG tablet Take 200 mg by mouth every 8 (eight) hours as needed for pain.    . meloxicam (MOBIC) 15 MG tablet Take 1 tablet (15 mg total) by mouth daily as needed for pain. 30 tablet 1  . valACYclovir (VALTREX) 1000 MG tablet Take 2 tablets (2,000 mg total) by mouth 2 (two) times daily. X 1 day as needed for fever blister outbreak 4 tablet 1  . amphetamine-dextroamphetamine (ADDERALL XR) 15 MG 24 hr capsule Take 1 capsule by mouth daily. 30 capsule 0  . QUEtiapine (SEROQUEL) 25 MG tablet TAKE 1 TABLET AT BEDTIME 90 tablet 0   No facility-administered medications prior to visit.    PAST MEDICAL HISTORY: Past Medical History  Diagnosis Date  . Sleep disorder     Dr Beacher May, chronic and severe insomnia  . Gluten intolerance 2011    Dr Marquette Saa  . Chronic insomnia   . Depression   . Anxiety   . Seasonal allergic rhinitis     PAST SURGICAL HISTORY: Past Surgical History  Procedure Laterality Date  . Upper gastrointestinal endoscopy  2008    neg; Dr Watt Climes  . Upper gastrointestinal endoscopy  2009     [facility]  . Knee arthroscopy Left 1995  . Anterior cruciate ligament repair Right 08/2013    [facility]    FAMILY HISTORY: Family History  Problem Relation Age of Onset  . Anxiety disorder Mother   . Depression Mother   . Hypertension Mother   . Stroke Mother 56  . Heart failure Mother   . Heart attack Mother 60  . Breast cancer Mother 7  . COPD Mother     smoker  . Heart attack Paternal Grandfather 34  . Pulmonary embolism Father 45    post rotator cuff surgery  . Tremor Sister   . Tremor Sister     SOCIAL HISTORY: Social History    Social History  . Marital Status: Single    Spouse Name: N/A  . Number of Children: 0  . Years of Education: College   Occupational History  .  [employer]   Social History Main Topics  . Smoking status: Never Smoker   . Smokeless tobacco: Never Used  . Alcohol Use: 4.2 oz/week    7 Glasses of wine per week     Comment:  socially  . Drug Use: No  . Sexual Activity: Not on file   Other Topics Concern  . Not on file   Social History Narrative   Patient is single and lives alone.     Patient has a college education.   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   Patient is right-handed.            PHYSICAL EXAM  Filed Vitals:   05/29/15 1550  BP: 119/82  Pulse: 84  Height: 4\' 11"  (1.499 m)  Weight: 110 lb 8 oz (50.122 kg)   Body mass index is 22.31 kg/(m^2).  Generalized: Well developed, in no acute distress   Neurological examination  Mentation: Alert oriented to time, place, history taking. Follows all commands speech and language fluent Cranial nerve II-XII: Pupils were equal round reactive to light. Extraocular movements were full, visual field were full on confrontational test. Facial sensation and strength were normal. Uvula tongue midline. Head turning and shoulder shrug  were normal and symmetric. Motor: The motor testing reveals 5 over 5 strength of all 4 extremities. Good symmetric motor tone is noted throughout.  Sensory: Sensory testing is intact to soft touch on all 4 extremities. No evidence of extinction is noted.  Coordination: Cerebellar testing reveals good finger-nose-finger and heel-to-shin bilaterally.  Gait and station: Gait is normal. Tandem gait is normal. Romberg is negative. No drift is seen.  Reflexes: Deep tendon reflexes are symmetric and normal bilaterally.   DIAGNOSTIC DATA (LABS, IMAGING, TESTING) - I reviewed  patient records, labs, notes, testing and imaging myself where available.  Lab Results  Component Value Date   WBC 5.9 11/08/2014   HGB 15.0 11/08/2014   HCT 45.1 11/08/2014   MCV 91.5 11/08/2014   PLT 217.0 11/08/2014      Component Value Date/Time   NA 141 11/08/2014 0738   K 4.6 11/08/2014 0738   CL 107 11/08/2014 0738   CO2 26 11/08/2014 0738   GLUCOSE 74 11/08/2014 0738   BUN 12 11/08/2014 0738   CREATININE 0.82 11/08/2014 0738   CALCIUM 8.8 11/08/2014 0738   PROT 6.5 11/08/2014 0738   ALBUMIN 4.0 11/08/2014 0738   AST 38*  11/08/2014 0738   ALT 19 11/08/2014 0738   ALKPHOS 46 11/08/2014 0738   BILITOT 0.6 11/08/2014 0738   GFRNONAA 86* 12/14/2012 0250   GFRAA >90 12/14/2012 0250   Lab Results  Component Value Date   CHOL 159 11/08/2014   HDL 72.90 11/08/2014   LDLCALC 73 11/08/2014   TRIG 64.0 11/08/2014   CHOLHDL 2 11/08/2014    Lab Results  Component Value Date   TSH 2.70 11/08/2014      ASSESSMENT AND PLAN 50 y.o. year old female  has a past medical history of Sleep disorder; Gluten intolerance (2011); Chronic insomnia; Depression; Anxiety; and Seasonal allergic rhinitis. here with:  1. Insomnia 2. Excessive daytime sleepiness  Overall the patient is doing well. She will continue on Adderall and seroquel. I will refill these medications today. The patient advised that if her symptoms worsen or she develops any new symptoms she should let us know. She will follow-up in 6 months with Dr. Mechele Claude, MSN, NP-C 05/29/2015, 4:38 PM [facility] [address] [place], [place] [address] [phone]

## 2015-05-30 NOTE — Progress Notes (Signed)
I agree with the assessment and plan as directed by NP .The patient is known to me .   Cyrilla Durkin, MD  

## 2015-07-01 ENCOUNTER — Telehealth: Payer: Self-pay | Admitting: Adult Health

## 2015-07-01 MED ORDER — AMPHETAMINE-DEXTROAMPHET ER 15 MG PO CP24: 15.0000 mg | ORAL_CAPSULE | Freq: Every day | ORAL | Status: DC

## 2015-07-01 NOTE — Telephone Encounter (Signed)
Called pt. Rx refill ready to be picked up in office. She verbalized understanding. Advised we close at [place].

## 2015-07-01 NOTE — Telephone Encounter (Signed)
Pt need refill on amphetamine-dextroamphetamine (ADDERALL XR) 15 MG 24 hr capsule. Thank you

## 2015-08-04 ENCOUNTER — Other Ambulatory Visit: Payer: Self-pay | Admitting: Adult Health

## 2015-08-04 MED ORDER — AMPHETAMINE-DEXTROAMPHET ER 15 MG PO CP24: 15.0000 mg | ORAL_CAPSULE | Freq: Every day | ORAL | Status: DC

## 2015-08-04 NOTE — Telephone Encounter (Signed)
Patient is calling to order a written Rx amphetamine-dextroamphetamine 15 mg 24 hr capsules.  Thanks!

## 2015-09-09 ENCOUNTER — Encounter: Payer: Self-pay | Admitting: Internal Medicine

## 2015-09-17 NOTE — Telephone Encounter (Signed)
Ok to work in with [employer] if [employer] and take care of you!

## 2015-10-06 ENCOUNTER — Other Ambulatory Visit: Payer: Self-pay | Admitting: Adult Health

## 2015-10-06 NOTE — Telephone Encounter (Signed)
Patient aware she must pick up rx from our office.

## 2015-10-06 NOTE — Telephone Encounter (Signed)
Patient requesting refill of amphetamine-dextroamphetamine (ADDERALL XR) 15 MG 24 hr capsule Pharmacy Pick up

## 2015-10-07 MED ORDER — AMPHETAMINE-DEXTROAMPHET ER 15 MG PO CP24: 15.0000 mg | ORAL_CAPSULE | Freq: Every day | ORAL | Status: DC

## 2015-11-25 ENCOUNTER — Telehealth: Payer: Self-pay

## 2015-11-25 NOTE — Telephone Encounter (Signed)
I called to ask pt if she was willing to see Hoyle Sauer, NP tomorrow at 4:15. Pt cannot-she has an appt at 4. She will still see Dr. Brett Fairy at 2:30. Pt verbalized understanding.

## 2015-11-26 ENCOUNTER — Encounter: Payer: Self-pay | Admitting: Neurology

## 2015-11-26 ENCOUNTER — Ambulatory Visit (INDEPENDENT_AMBULATORY_CARE_PROVIDER_SITE_OTHER): Payer: Managed Care, Other (non HMO) | Admitting: Neurology

## 2015-11-26 VITALS — BP 128/72 | HR 64 | Resp 20 | Ht 58.5 in | Wt 111.5 lb

## 2015-11-26 DIAGNOSIS — F5102 Adjustment insomnia: Secondary | ICD-10-CM | POA: Diagnosis not present

## 2015-11-26 DIAGNOSIS — F909 Attention-deficit hyperactivity disorder, unspecified type: Secondary | ICD-10-CM | POA: Diagnosis not present

## 2015-11-26 DIAGNOSIS — F1510 Other stimulant abuse, uncomplicated: Secondary | ICD-10-CM

## 2015-11-26 DIAGNOSIS — F489 Nonpsychotic mental disorder, unspecified: Secondary | ICD-10-CM

## 2015-11-26 DIAGNOSIS — F1590 Other stimulant use, unspecified, uncomplicated: Secondary | ICD-10-CM

## 2015-11-26 DIAGNOSIS — F5105 Insomnia due to other mental disorder: Secondary | ICD-10-CM

## 2015-11-26 DIAGNOSIS — F988 Other specified behavioral and emotional disorders with onset usually occurring in childhood and adolescence: Secondary | ICD-10-CM

## 2015-11-26 MED ORDER — AMPHETAMINE-DEXTROAMPHET ER 15 MG PO CP24: 15.0000 mg | ORAL_CAPSULE | Freq: Every day | ORAL | 0 refills | Status: DC

## 2015-11-26 MED ORDER — QUETIAPINE FUMARATE 25 MG PO TABS: 25.0000 mg | ORAL_TABLET | Freq: Every day | ORAL | 3 refills | Status: DC

## 2015-11-26 NOTE — Progress Notes (Signed)
PATIENT: Susan Fisher DOB: Apr 05, 1966  REASON FOR VISIT: follow up- excessive daytime sleepiness, insomnia HISTORY FROM: patient  HISTORY OF PRESENT ILLNESS:   HISTORY 11/26/14: Susan Fisher is a 50 year old female with a history of insomnia and excessive daytime sleepiness. She returns today for follow-up. At the last visit the patient's Adderall was increased to 15mg  she states that this has been working well. She continues to take Seroquel at bedtime. She states in the last couple weeks she's been under additional stress due to starting school. She is enrolled at [facility] for the physical therapy program. She feels that her stress has caused her insomnia to worsen. She states that she usually tries to go to bed between 10 and 11 PM and arises at 6:30 AM. She denies any new symptoms. She returns today for an evaluation.   MM - 05/2015 Susan Fisher is a 50 year old female with a history of excessive daytime sleepiness and insomnia. She returns today for an evaluation. The patient continues to take Adderall 15 mg daily. She feels that this is working well for her. She states that if she was not taking this medication she feels that she would be a "zombie." She feels that this is partly due to her being in school for physical therapy. She states that the program is very rigorous and takes up most of her time. She continues using Seroquel at night. She states that this works well unless someone spends the night at her house. She states that they get up during the night to use the restroom and she hears them she will be unable to fall back asleep. She denies any new neurological symptoms. She returns today for an evaluation.  I have the pleasure of seeing Susan Fisher today on 11/26/2015 and a routine revisit. The patient has chronic insomnia which contains both elements the inability to initiate sleep at times and the inability to maintain or sustain sleep. She has been doing well with using Seroquel she does  need to take Adderall in daytime also it may interfere his nocturnal sleep for excessive daytime sleepiness. It helped her to study as well to focus and concentrate and not be distracted. 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  REVIEW OF SYSTEMS: Out of a complete 14 system review of symptoms, the patient complains only of the following symptoms, and all other reviewed systems are negative.  Insomnia, daytime sleepiness, Epworth sleepiness score 10 and fatigue severity score 46  ALLERGIES: Allergies  Allergen Reactions  . Gluten Meal Other (See Comments)    Gluten intolerant  . Venlafaxine     ineffective    HOME MEDICATIONS: Outpatient Medications Prior to Visit  Medication Sig Dispense Refill  . amphetamine-dextroamphetamine (ADDERALL XR) 15 MG 24 hr capsule Take 1 capsule by mouth daily. 30 capsule 0  . meloxicam (MOBIC) 15 MG tablet Take 1 tablet (15 mg total) by mouth daily as needed for pain. 30 tablet 1  . QUEtiapine (SEROQUEL) 25 MG tablet Take 1 tablet (25 mg total) by mouth at bedtime. 90 tablet 3  . valACYclovir (VALTREX) 1000 MG tablet Take 2 tablets (2,000 mg total) by mouth 2 (two) times daily. X 1 day as needed for fever blister outbreak 4 tablet 1  . ALPRAZolam (XANAX) 0.5 MG tablet Take 0.5 tablets (0.25 mg total) by mouth at bedtime as needed for sleep. 20 tablet 0  . ibuprofen (ADVIL,MOTRIN) 200 MG tablet Take 200 mg by mouth every 8 (  eight) hours as needed for pain.     No facility-administered medications prior to visit.     PAST MEDICAL HISTORY: Past Medical History:  Diagnosis Date  . Anxiety   . Chronic insomnia   . Depression   . Gluten intolerance 2011   Dr Marquette Saa  . Seasonal allergic rhinitis   . Sleep disorder    Dr Beacher May, chronic and severe insomnia    PAST SURGICAL HISTORY: Past Surgical History:  Procedure Laterality Date  . ANTERIOR CRUCIATE LIGAMENT REPAIR Right 08/2013   [name]  . KNEE ARTHROSCOPY  Left 1995  . UPPER GASTROINTESTINAL ENDOSCOPY  2008   neg; Dr Watt Climes  . UPPER GASTROINTESTINAL ENDOSCOPY  2009    [facility]    FAMILY HISTORY: Family History  Problem Relation Age of Onset  . Anxiety disorder Mother   . Depression Mother   . Hypertension Mother   . Stroke Mother 27  . Heart failure Mother   . Heart attack Mother 64  . Breast cancer Mother 70  . COPD Mother     smoker  . Heart attack Paternal Grandfather 28  . Pulmonary embolism Father 58    post rotator cuff surgery  . Tremor Sister   . Tremor Sister     SOCIAL HISTORY: Social History   Social History  . Marital status: Single    Spouse name: N/A  . Number of children: 0  . Years of education: College   Occupational History  .  [employer]   Social History Main Topics  . Smoking status: Never Smoker  . Smokeless tobacco: Never Used  . Alcohol use 4.2 oz/week    7 Glasses of wine per week     Comment:  socially  . Drug use: No  . Sexual activity: Not on file   Other Topics Concern  . Not on file   Social History Narrative   Patient is single and lives alone.     Patient has a college education.   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 -    planning to enroll with PT asst school at [employer] fall 2016   Patient drinks two caffeine drinks daily.   Patient is right-handed.         PHYSICAL EXAM  Vitals:   11/26/15 1456  BP: 128/72  Pulse: 64  Resp: 20  Weight: 111 lb 8 oz (50.6 kg)  Height: 4' 10.5" (1.486 m)   Body mass index is 22.91 kg/m.  Generalized: Well developed, in no acute distress   Neurological examination  Mentation: Alert oriented to time, place, history taking. Follows all commands speech and language fluent Cranial nerve ; no change in taste or smell.  Pupils were equal round reactive to light. Extraocular movements were full, visual field were full on confrontational test. Facial sensation and strength were normal. Uvula tongue midline. Head turning and  shoulder shrug  were normal and symmetric. Motor: The motor testing reveals 5 over 5 strength of all 4 extremities. Good symmetric motor tone is noted throughout.  Sensory: Sensory testing is intact to soft touch on all 4 extremities. No evidence of extinction is noted.  Coordination: Cerebellar testing reveals good finger-nose-finger and heel-to-shin bilaterally.  Gait and station: Gait is normal. Tandem gait is normal. Romberg is negative. No drift is seen.  Reflexes: Deep tendon reflexes are symmetric and normal bilaterally.   DIAGNOSTIC DATA (LABS, IMAGING, TESTING) - I reviewed patient records, labs, notes, testing and imaging myself where  available.  Lab Results  Component Value Date   WBC 5.9 11/08/2014   HGB 15.0 11/08/2014   HCT 45.1 11/08/2014   MCV 91.5 11/08/2014   PLT 217.0 11/08/2014      Component Value Date/Time   NA 141 11/08/2014 0738   K 4.6 11/08/2014 0738   CL 107 11/08/2014 0738   CO2 26 11/08/2014 0738   GLUCOSE 74 11/08/2014 0738   BUN 12 11/08/2014 0738   CREATININE 0.82 11/08/2014 0738   CALCIUM 8.8 11/08/2014 0738   PROT 6.5 11/08/2014 0738   ALBUMIN 4.0 11/08/2014 0738   AST 38 (H) 11/08/2014 0738   ALT 19 11/08/2014 0738   ALKPHOS 46 11/08/2014 0738   BILITOT 0.6 11/08/2014 0738   GFRNONAA 86 (L) 12/14/2012 0250   GFRAA >90 12/14/2012 0250   Lab Results  Component Value Date   CHOL 159 11/08/2014   HDL 72.90 11/08/2014   LDLCALC 73 11/08/2014   TRIG 64.0 11/08/2014   CHOLHDL 2 11/08/2014    Lab Results  Component Value Date   TSH 2.70 11/08/2014      ASSESSMENT AND PLAN 50 y.o. year old female  has a past medical history of Anxiety; Chronic insomnia; Depression; Gluten intolerance (2011); Seasonal allergic rhinitis; and Sleep disorder. here with:  1. Insomnia, She is getting on average 6 hours of nocturnal sleep on Seroquel . There are difficulties to fall asleep and stay asleep recorded. Stress is a component . 2. 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 Controlled sleepiness and ADD.  Overall the patient is doing well. She will continue on Adderall and seroquel.  I will refill these medications today.  The patient advised that if her symptoms worsen or she develops any new symptoms she should let us know. She will follow-up in 12 months with NP.   Larey Seat, MD  11/26/2015, 3:07 PM   Medical director of [facility] at  [facility] [address], [place], [place] [address] [phone]

## 2016-01-15 ENCOUNTER — Encounter: Payer: Self-pay | Admitting: Student

## 2016-01-22 ENCOUNTER — Other Ambulatory Visit: Payer: Self-pay | Admitting: Neurology

## 2016-01-22 DIAGNOSIS — F5102 Adjustment insomnia: Secondary | ICD-10-CM

## 2016-01-22 DIAGNOSIS — F988 Other specified behavioral and emotional disorders with onset usually occurring in childhood and adolescence: Secondary | ICD-10-CM

## 2016-01-22 MED ORDER — AMPHETAMINE-DEXTROAMPHET ER 15 MG PO CP24: 15.0000 mg | ORAL_CAPSULE | Freq: Every day | ORAL | 0 refills | Status: DC

## 2016-01-22 NOTE — Telephone Encounter (Signed)
Pt request refill for amphetamine-dextroamphetamine (ADDERALL XR) 15 MG 24 hr capsule . Pt will pick it up on Monday

## 2016-01-22 NOTE — Telephone Encounter (Signed)
I spoke to pt and advised her that her RX for adderall is ready for pick up at the front desk. Pt verbalized understanding.

## 2016-03-23 ENCOUNTER — Encounter (HOSPITAL_COMMUNITY): Payer: Self-pay | Admitting: Emergency Medicine

## 2016-03-23 ENCOUNTER — Ambulatory Visit (HOSPITAL_COMMUNITY)
Admission: EM | Admit: 2016-03-23 | Discharge: 2016-03-23 | Disposition: A | Discharge status: Home / Self Care | Payer: Managed Care, Other (non HMO) | Attending: Emergency Medicine | Admitting: Emergency Medicine

## 2016-03-23 DIAGNOSIS — S0502XA Injury of conjunctiva and corneal abrasion without foreign body, left eye, initial encounter: Secondary | ICD-10-CM

## 2016-03-23 MED ORDER — TETRACAINE HCL 0.5 % OP SOLN
OPHTHALMIC | Status: AC
  Filled 2016-03-23: qty 2

## 2016-03-23 MED ORDER — FLUORESCEIN SODIUM 1 MG OP STRP
ORAL_STRIP | OPHTHALMIC | Status: AC
  Filled 2016-03-23: qty 2

## 2016-03-23 MED ORDER — ERYTHROMYCIN 5 MG/GM OP OINT: TOPICAL_OINTMENT | OPHTHALMIC | 0 refills | Status: DC

## 2016-03-23 NOTE — ED Notes (Signed)
Patient thought she was getting a cold this morning .  Eyes started bothering her this morning.  History of chronic dry eyes.  Does not wear contacts, does wear corrective lenses.

## 2016-03-23 NOTE — ED Provider Notes (Signed)
[facility]    CSN: [id number] Arrival date & time: 03/23/16  1741     History   Chief Complaint Chief Complaint  Patient presents with  . Eye Problem    HPI Susan Fisher is a 50 y.o. female.   HPI She is a 50 year old woman here for evaluation of eye pain. It started this morning with just a little bit of irritation to the eyes. It has gradually gotten worse. Now she describes it as needles in her left eye. Over the last several hours she has developed sensitivity to light. Denies frank changes in vision. She states her eyes look a little red to her. Denies any injury or trauma to the eye. No recent activities where something would've gotten into the eye.  Past Medical History:  Diagnosis Date  . Anxiety   . Chronic insomnia   . Depression   . Gluten intolerance 2011   Dr Marquette Saa  . Seasonal allergic rhinitis   . Sleep disorder    Dr Beacher May, chronic and severe insomnia    Patient Active Problem List   Diagnosis Date Noted  . Gluten intolerance 11/07/2014  . 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 Seasonal allergies 04/04/2009  . ANXIETY 10/09/2007  . SLEEP DISORDER 10/09/2007    Past Surgical History:  Procedure Laterality Date  . ANTERIOR CRUCIATE LIGAMENT REPAIR Right 08/2013   [name]  . KNEE ARTHROSCOPY Left 1995  . UPPER GASTROINTESTINAL ENDOSCOPY  2008   neg; Dr Watt Climes  . UPPER GASTROINTESTINAL ENDOSCOPY  2009    [facility]    OB History    No data available       Home Medications    Prior to Admission medications   Medication Sig Start Date End Date Taking? Authorizing Provider  amphetamine-dextroamphetamine (ADDERALL XR) 15 MG 24 hr capsule Take 1 capsule by mouth daily. 01/22/16   Asencion Partridge Dohmeier, MD  erythromycin ophthalmic ointment Place a 1/2 inch ribbon of ointment into the lower eyelid 3 times daily for 5 days 03/23/16   Melony Overly, MD  meloxicam (MOBIC) 15 MG tablet Take 1 tablet (15 mg total) by mouth daily as  needed for pain. 11/07/14   Rowe Clack, MD  QUEtiapine (SEROQUEL) 25 MG tablet Take 1 tablet (25 mg total) by mouth at bedtime. 11/26/15   Asencion Partridge Dohmeier, MD  valACYclovir (VALTREX) 1000 MG tablet Take 2 tablets (2,000 mg total) by mouth 2 (two) times daily. X 1 day as needed for fever blister outbreak 11/07/14   Rowe Clack, MD    Family History Family History  Problem Relation Age of Onset  . Anxiety disorder Mother   . Depression Mother   . Hypertension Mother   . Stroke Mother 50  . Heart failure Mother   . Heart attack Mother 47  . Breast cancer Mother 46  . COPD Mother     smoker  . Pulmonary embolism Father 9    post rotator cuff surgery  . Heart attack Paternal Grandfather 61  . Tremor Sister   . Tremor Sister     Social History Social History  Substance Use Topics  . Smoking status: Never Smoker  . Smokeless tobacco: Never Used  . Alcohol use 4.2 oz/week    7 Glasses of wine per week     Comment:  socially     Allergies   Gluten meal and Venlafaxine   Review of Systems Review of Systems As in history of present  illness  Physical Exam Triage Vital Signs ED Triage Vitals  Enc Vitals Group     BP      Pulse      Resp      Temp      Temp src      SpO2      Weight      Height      Head Circumference      Peak Flow      Pain Score      Pain Loc      Pain Edu?      Excl. in [place]?    No data found.   Updated Vital Signs LMP 03/16/2016   Visual Acuity Right Eye Distance: 20/20 Left Eye Distance: 20/50 Bilateral Distance: 20/20  Right Eye Near:   Left Eye Near:    Bilateral Near:     Physical Exam  Constitutional: She is oriented to person, place, and time. She appears well-developed and well-nourished. No distress.  Eyes: EOM are normal. Pupils are equal, round, and reactive to light. Left eye exhibits no hordeolum. No foreign body present in the left eye. Left conjunctiva is not injected. Left conjunctiva has no hemorrhage.    Slit lamp exam:      The right eye shows no hyphema and no hypopyon.       The left eye shows corneal abrasion and fluorescein uptake. The left eye shows no hyphema and no hypopyon.  There may be very minimal injection to the conjunctiva but overall they are clear.  Pulmonary/Chest: Effort normal.  Neurological: She is alert and oriented to person, place, and time.     UC Treatments / Results  Labs (all labs ordered are listed, but only abnormal results are displayed) Labs Reviewed - No data to display  EKG  EKG Interpretation None       Radiology No results found.  Procedures Procedures (including critical care time)  Medications Ordered in UC Medications - No data to display   Initial Impression / Assessment and Plan / UC Course  I have reviewed the triage vital signs and the nursing notes.  Pertinent labs & imaging results that were available during my care of the patient were reviewed by me and considered in my medical decision making (see chart for details).  Clinical Course    Tetracaine drops placed here. Erythromycin ointment for comfort. Alternate Tylenol and ibuprofen for pain. If not improving in 2 days, follow up with ophthalmology.  Final Clinical Impressions(s) / UC Diagnoses   Final diagnoses:  Abrasion of left cornea, initial encounter    New Prescriptions New Prescriptions   ERYTHROMYCIN OPHTHALMIC OINTMENT    Place a 1/2 inch ribbon of ointment into the lower eyelid 3 times daily for 5 days     Melony Overly, MD 03/23/16 [phone]

## 2016-03-23 NOTE — ED Triage Notes (Signed)
Triaged by Provider. 

## 2016-03-23 NOTE — Discharge Instructions (Signed)
You have a corneal abrasion. Use erythromycin ointment 3 times a day for the next 5 days. Take Tylenol and ibuprofen to help with the pain. If this is not improving in 2 days, please follow-up with your eye doctor.

## 2016-03-24 LAB — RESULTS CONSOLE HPV: CHL HPV: NEGATIVE

## 2016-03-24 LAB — HM PAP SMEAR

## 2016-04-01 ENCOUNTER — Other Ambulatory Visit: Payer: Self-pay | Admitting: Obstetrics and Gynecology

## 2016-04-16 NOTE — Patient Instructions (Signed)
Your procedure is scheduled on:  Thursday, Dec. 28, 2017  Enter through the [employer] [facility] at:  6:00 AM  Pick up the phone at the desk and dial [phone].  Call this number if you have problems the morning of surgery: [phone].  Remember: Do NOT eat food or drink after:  Midnight Wednesday  Take these medicines the morning of surgery with a SIP OF WATER:  None  Stop ALL herbal medications at this time   Do NOT wear jewelry (body piercing), metal hair clips/bobby pins, make-up, or nail polish. Do NOT wear lotions, powders, or perfumes.  You may wear deodorant. Do NOT shave for 48 hours prior to surgery. Do NOT bring valuables to the hospital. Contacts, dentures, or bridgework may not be worn into surgery.  Have a responsible adult drive you home and stay with you for 24 hours after your procedure

## 2016-04-20 ENCOUNTER — Encounter (HOSPITAL_COMMUNITY): Payer: Self-pay

## 2016-04-20 ENCOUNTER — Encounter (HOSPITAL_COMMUNITY)
Admission: RE | Admit: 2016-04-20 | Discharge: 2016-04-20 | Disposition: A | Discharge status: Home / Self Care | Payer: Managed Care, Other (non HMO) | Source: Ambulatory Visit | Attending: Obstetrics and Gynecology | Admitting: Obstetrics and Gynecology

## 2016-04-20 DIAGNOSIS — N921 Excessive and frequent menstruation with irregular cycle: Secondary | ICD-10-CM | POA: Diagnosis not present

## 2016-04-20 DIAGNOSIS — Z79899 Other long term (current) drug therapy: Secondary | ICD-10-CM | POA: Diagnosis not present

## 2016-04-20 DIAGNOSIS — F329 Major depressive disorder, single episode, unspecified: Secondary | ICD-10-CM | POA: Diagnosis not present

## 2016-04-20 DIAGNOSIS — N858 Other specified noninflammatory disorders of uterus: Secondary | ICD-10-CM | POA: Diagnosis present

## 2016-04-20 DIAGNOSIS — N8501 Benign endometrial hyperplasia: Secondary | ICD-10-CM | POA: Diagnosis not present

## 2016-04-20 DIAGNOSIS — D250 Submucous leiomyoma of uterus: Secondary | ICD-10-CM | POA: Diagnosis not present

## 2016-04-20 HISTORY — DX: Pneumonia, unspecified organism: J18.9

## 2016-04-20 LAB — CBC
HCT: 44.6 % (ref 36.0–46.0)
HEMOGLOBIN: 15.2 g/dL — AB (ref 12.0–15.0)
MCH: 30.9 pg (ref 26.0–34.0)
MCHC: 34.1 g/dL (ref 30.0–36.0)
MCV: 90.7 fL (ref 78.0–100.0)
Platelets: 302 10*3/uL (ref 150–400)
RBC: 4.92 MIL/uL (ref 3.87–5.11)
RDW: 13.2 % (ref 11.5–15.5)
WBC: 5.3 10*3/uL (ref 4.0–10.5)

## 2016-04-21 ENCOUNTER — Encounter (HOSPITAL_COMMUNITY): Payer: Self-pay | Admitting: Anesthesiology

## 2016-04-21 NOTE — Anesthesia Preprocedure Evaluation (Addendum)
Anesthesia Evaluation  Patient identified by MRN, date of birth, ID band Patient awake    Reviewed: Allergy & Precautions, NPO status , Patient's Chart, lab work & pertinent test results  History of Anesthesia Complications Negative for: history of anesthetic complications  Airway Mallampati: II  TM Distance: >3 FB Neck ROM: Full    Dental no notable dental hx. (+) Dental Advisory Given   Pulmonary pneumonia, resolved,    Pulmonary exam normal        Cardiovascular negative cardio ROS Normal cardiovascular exam     Neuro/Psych PSYCHIATRIC DISORDERS Anxiety Depression Chronic insomnianegative neurological ROS     GI/Hepatic negative GI ROS, Neg liver ROS,   Endo/Other  negative endocrine ROS  Renal/GU negative Renal ROS  negative genitourinary   Musculoskeletal negative musculoskeletal ROS (+)   Abdominal   Peds  Hematology negative hematology ROS (+)   Anesthesia Other Findings   Reproductive/Obstetrics DUB  Endometrial mass                            Lab Results  Component Value Date   WBC 5.3 04/20/2016   HGB 15.2 (H) 04/20/2016   HCT 44.6 04/20/2016   MCV 90.7 04/20/2016   PLT 302 04/20/2016    Anesthesia Physical Anesthesia Plan  ASA: II  Anesthesia Plan: General   Post-op Pain Management:    Induction: Intravenous  Airway Management Planned: LMA  Additional Equipment:   Intra-op Plan:   Post-operative Plan: Extubation in OR  Informed Consent: I have reviewed the patients History and Physical, chart, labs and discussed the procedure including the risks, benefits and alternatives for the proposed anesthesia with the patient or authorized representative who has indicated his/her understanding and acceptance.   Dental advisory given  Plan Discussed with: CRNA, Anesthesiologist and Surgeon  Anesthesia Plan Comments:         Anesthesia Quick Evaluation

## 2016-04-22 ENCOUNTER — Ambulatory Visit (HOSPITAL_COMMUNITY)
Admission: RE | Admit: 2016-04-22 | Discharge: 2016-04-22 | Disposition: A | Discharge status: Home / Self Care | Payer: Managed Care, Other (non HMO) | Source: Ambulatory Visit | Attending: Obstetrics and Gynecology | Admitting: Obstetrics and Gynecology

## 2016-04-22 ENCOUNTER — Ambulatory Visit (HOSPITAL_COMMUNITY): Payer: Managed Care, Other (non HMO) | Admitting: Anesthesiology

## 2016-04-22 ENCOUNTER — Encounter (HOSPITAL_COMMUNITY)
Admission: RE | Disposition: A | Discharge status: Home / Self Care | Payer: Self-pay | Source: Ambulatory Visit | Attending: Obstetrics and Gynecology

## 2016-04-22 ENCOUNTER — Encounter (HOSPITAL_COMMUNITY): Payer: Self-pay

## 2016-04-22 DIAGNOSIS — N840 Polyp of corpus uteri: Secondary | ICD-10-CM

## 2016-04-22 DIAGNOSIS — Z79899 Other long term (current) drug therapy: Secondary | ICD-10-CM | POA: Insufficient documentation

## 2016-04-22 DIAGNOSIS — D250 Submucous leiomyoma of uterus: Secondary | ICD-10-CM | POA: Insufficient documentation

## 2016-04-22 DIAGNOSIS — N921 Excessive and frequent menstruation with irregular cycle: Secondary | ICD-10-CM | POA: Insufficient documentation

## 2016-04-22 DIAGNOSIS — N8501 Benign endometrial hyperplasia: Secondary | ICD-10-CM | POA: Insufficient documentation

## 2016-04-22 DIAGNOSIS — N923 Ovulation bleeding: Secondary | ICD-10-CM

## 2016-04-22 DIAGNOSIS — F329 Major depressive disorder, single episode, unspecified: Secondary | ICD-10-CM | POA: Insufficient documentation

## 2016-04-22 HISTORY — PX: DILATATION & CURETTAGE/HYSTEROSCOPY WITH MYOSURE: SHX6511

## 2016-04-22 SURGERY — DILATATION & CURETTAGE/HYSTEROSCOPY WITH MYOSURE
Anesthesia: General | Site: Uterus

## 2016-04-22 MED ORDER — EPHEDRINE SULFATE 50 MG/ML IJ SOLN: INTRAMUSCULAR | Status: DC | PRN

## 2016-04-22 MED ORDER — PROPOFOL 10 MG/ML IV BOLUS
INTRAVENOUS | Status: DC | PRN
  Administered 2016-04-22: 160 mg via INTRAVENOUS

## 2016-04-22 MED ORDER — MIDAZOLAM HCL 2 MG/2ML IJ SOLN
INTRAMUSCULAR | Status: AC
  Filled 2016-04-22: qty 2

## 2016-04-22 MED ORDER — MIDAZOLAM HCL 2 MG/2ML IJ SOLN
INTRAMUSCULAR | Status: DC | PRN
  Administered 2016-04-22: 1 mg via INTRAVENOUS

## 2016-04-22 MED ORDER — DEXAMETHASONE SODIUM PHOSPHATE 4 MG/ML IJ SOLN
INTRAMUSCULAR | Status: AC
  Filled 2016-04-22: qty 1

## 2016-04-22 MED ORDER — SCOPOLAMINE 1 MG/3DAYS TD PT72
MEDICATED_PATCH | TRANSDERMAL | Status: AC
  Administered 2016-04-22: 1.5 mg via TRANSDERMAL
  Filled 2016-04-22: qty 1

## 2016-04-22 MED ORDER — HYDROMORPHONE HCL 1 MG/ML IJ SOLN
0.2500 mg | INTRAMUSCULAR | Status: DC | PRN
  Administered 2016-04-22: 0.5 mg via INTRAVENOUS

## 2016-04-22 MED ORDER — PROPOFOL 10 MG/ML IV BOLUS
INTRAVENOUS | Status: AC
Start: 2016-04-22 — End: 2016-04-22
  Filled 2016-04-22: qty 20

## 2016-04-22 MED ORDER — PROMETHAZINE HCL 25 MG/ML IJ SOLN: 6.2500 mg | INTRAMUSCULAR | Status: DC | PRN

## 2016-04-22 MED ORDER — SODIUM CHLORIDE 0.9 % IR SOLN
Status: DC | PRN
  Administered 2016-04-22: 3000 mL

## 2016-04-22 MED ORDER — LIDOCAINE HCL (CARDIAC) 20 MG/ML IV SOLN
INTRAVENOUS | Status: DC | PRN
  Administered 2016-04-22: 30 mg via INTRAVENOUS
  Administered 2016-04-22: 70 mg via INTRAVENOUS

## 2016-04-22 MED ORDER — KETOROLAC TROMETHAMINE 30 MG/ML IJ SOLN
INTRAMUSCULAR | Status: AC
  Filled 2016-04-22: qty 1

## 2016-04-22 MED ORDER — SCOPOLAMINE 1 MG/3DAYS TD PT72
1.0000 | MEDICATED_PATCH | Freq: Once | TRANSDERMAL | Status: DC
  Administered 2016-04-22: 1.5 mg via TRANSDERMAL

## 2016-04-22 MED ORDER — ONDANSETRON HCL 4 MG/2ML IJ SOLN
INTRAMUSCULAR | Status: DC | PRN
  Administered 2016-04-22: 4 mg via INTRAVENOUS

## 2016-04-22 MED ORDER — DEXAMETHASONE SODIUM PHOSPHATE 10 MG/ML IJ SOLN
INTRAMUSCULAR | Status: DC | PRN
  Administered 2016-04-22: 4 mg via INTRAVENOUS

## 2016-04-22 MED ORDER — FENTANYL CITRATE (PF) 100 MCG/2ML IJ SOLN
INTRAMUSCULAR | Status: AC
  Filled 2016-04-22: qty 2

## 2016-04-22 MED ORDER — LACTATED RINGERS IV SOLN
INTRAVENOUS | Status: DC
  Administered 2016-04-22: 07:00:00 via INTRAVENOUS

## 2016-04-22 MED ORDER — KETOROLAC TROMETHAMINE 30 MG/ML IJ SOLN
INTRAMUSCULAR | Status: DC | PRN
  Administered 2016-04-22: 30 mg via INTRAVENOUS

## 2016-04-22 MED ORDER — FENTANYL CITRATE (PF) 100 MCG/2ML IJ SOLN
INTRAMUSCULAR | Status: DC | PRN
  Administered 2016-04-22 (×2): 50 ug via INTRAVENOUS

## 2016-04-22 MED ORDER — ONDANSETRON HCL 4 MG/2ML IJ SOLN
INTRAMUSCULAR | Status: AC
  Filled 2016-04-22: qty 2

## 2016-04-22 MED ORDER — HYDROMORPHONE HCL 1 MG/ML IJ SOLN
INTRAMUSCULAR | Status: AC
  Filled 2016-04-22: qty 1

## 2016-04-22 MED ORDER — LIDOCAINE HCL (CARDIAC) 20 MG/ML IV SOLN
INTRAVENOUS | Status: AC
  Filled 2016-04-22: qty 5

## 2016-04-22 MED ORDER — IBUPROFEN 800 MG PO TABS: 800.0000 mg | ORAL_TABLET | Freq: Three times a day (TID) | ORAL | 0 refills | Status: DC | PRN

## 2016-04-22 SURGICAL SUPPLY — 21 items
CANISTER SUCT 3000ML (MISCELLANEOUS) ×2 IMPLANT
CATH ROBINSON RED A/P 16FR (CATHETERS) ×2 IMPLANT
CLOTH BEACON ORANGE TIMEOUT ST (SAFETY) ×2 IMPLANT
CONTAINER PREFILL 10% NBF 60ML (FORM) ×4 IMPLANT
DEVICE MYOSURE LITE (MISCELLANEOUS) ×1 IMPLANT
DEVICE MYOSURE REACH (MISCELLANEOUS) IMPLANT
DILATOR CANAL MILEX (MISCELLANEOUS) ×1 IMPLANT
ELECT REM PT RETURN 9FT ADLT (ELECTROSURGICAL) ×2
ELECTRODE REM PT RTRN 9FT ADLT (ELECTROSURGICAL) ×1 IMPLANT
FILTER ARTHROSCOPY CONVERTOR (FILTER) ×2 IMPLANT
GLOVE BIOGEL PI IND STRL 7.0 (GLOVE) ×2 IMPLANT
GLOVE BIOGEL PI INDICATOR 7.0 (GLOVE) ×2
GLOVE ECLIPSE 6.5 STRL STRAW (GLOVE) ×2 IMPLANT
GOWN STRL REUS W/TWL LRG LVL3 (GOWN DISPOSABLE) ×4 IMPLANT
PACK VAGINAL MINOR WOMEN LF (CUSTOM PROCEDURE TRAY) ×2 IMPLANT
PAD OB MATERNITY 4.3X12.25 (PERSONAL CARE ITEMS) ×2 IMPLANT
SEAL ROD LENS SCOPE MYOSURE (ABLATOR) ×2 IMPLANT
TOWEL OR 17X24 6PK STRL BLUE (TOWEL DISPOSABLE) ×4 IMPLANT
TUBING AQUILEX INFLOW (TUBING) ×2 IMPLANT
TUBING AQUILEX OUTFLOW (TUBING) ×2 IMPLANT
WATER STERILE IRR 1000ML POUR (IV SOLUTION) ×2 IMPLANT

## 2016-04-22 NOTE — Transfer of Care (Signed)
Immediate Anesthesia Transfer of Care Note  Patient: Susan Fisher  Procedure(s) Performed: Procedure(s): DILATATION & CURETTAGE/HYSTEROSCOPY WITH  MYOSURE (N/A)  Patient Location: PACU  Anesthesia Type:General  Level of Consciousness: awake, alert , oriented and patient cooperative  Airway & Oxygen Therapy: Patient Spontanous Breathing and Patient connected to nasal cannula oxygen  Post-op Assessment: Report given to RN and Post -op Vital signs reviewed and stable  Post vital signs: Reviewed and stable  Last Vitals:  Vitals:   04/22/16 0638  BP: 106/69  Pulse: 74  Resp: 16  Temp: 36.6 C    Last Pain:  Vitals:   04/22/16 0638  TempSrc: Oral      Patients Stated Pain Goal: 3 ([id number])  Complications: No apparent anesthesia complications none

## 2016-04-22 NOTE — Op Note (Signed)
NAMEORLY, PEZZINO NO.:  [id number]  MEDICAL RECORD NO.:  [id number]  LOCATION:  PERIO                         FACILITY:  [facility]  PHYSICIAN:  Servando Salina, M.D.DATE OF BIRTH:  1965-12-18  DATE OF PROCEDURE:  04/22/2016 DATE OF DISCHARGE:                              OPERATIVE REPORT   PREOPERATIVE DIAGNOSES:  Intermenstrual bleeding,endometrial mass.  PROCEDURE:  Diagnostic hysteroscopy, hysteroscopic resection of endometrial polyp using Myosure, and dilation and curettage.  POSTOPERATIVE DIAGNOSES:  Intermenstrual bleeding, endometrial polyp.  ANESTHESIA:  General.  SURGEON:  Servando Salina, M.D.  ASSISTANT:  None.  DESCRIPTION OF PROCEDURE:  Under adequate general anesthesia, the patient was placed in the dorsal lithotomy position.  She was sterilely prepped and draped in the usual fashion.  The bladder was catheterized with small amount of urine.  Examination under anesthesia revealed a small anteverted uterus.  No adnexal masses could be appreciated. Bivalve speculum was placed in vagina.  Single-tooth tenaculum was placed on anterior lip of the cervix.  The cervix was serially dilated up to #[facility] dilator after using an Os Finder.  A MyoSure hysteroscope was introduced into the uterine cavity.  Both tubal ostia could be seen.  On the right lateral wall, there was a protruding endometrial mass consistent with a polyp and other smaller polyps distally.  Using Lite MyoSure resectoscope, the mass and the other lesions were removed.  Once all polypoid lesions were removed, endocervical canal was inspected.  No lesions noted.  The instruments were removed.  The cavity was gently curetted for a small amount of tissue.  All instruments were then removed from the vagina.  SPECIMEN:  Labeled endometrial curetting with a polyp, sent to Pathology.  ESTIMATED BLOOD LOSS:  Minimal.  URINE OUTPUT:  20 ml.  INTRAOPERATIVE FLUIDS:  800  mL.  COUNTS:  Sponge and instrument count x2 was correct.  COMPLICATION:  None.  The patient tolerated the procedure well and was transferred to recovery in stable condition.     Servando Salina, M.D.     [place]/MEDQ  D:  04/22/2016  T:  04/22/2016  Job:  [id number]

## 2016-04-22 NOTE — Anesthesia Postprocedure Evaluation (Signed)
Anesthesia Post Note  Patient: Susan Fisher  Procedure(s) Performed: Procedure(s) (LRB): DILATATION & CURETTAGE/HYSTEROSCOPY WITH  MYOSURE (N/A)  Patient location during evaluation: PACU Anesthesia Type: General Level of consciousness: sedated Pain management: pain level controlled Vital Signs Assessment: post-procedure vital signs reviewed and stable Respiratory status: spontaneous breathing and respiratory function stable Cardiovascular status: stable Anesthetic complications: no        Last Vitals:  Vitals:   04/22/16 0830 04/22/16 0900  BP: (!) 113/59 114/70  Pulse: 80 80  Resp: 18 18  Temp:  36.9 C    Last Pain:  Vitals:   04/22/16 0900  TempSrc:   PainSc: 1    Pain Goal: Patients Stated Pain Goal: 3 (04/22/16 [id number])               [place]

## 2016-04-22 NOTE — H&P (Signed)
Susan Fisher is an 50 y.o. 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    Past Medical History:  Diagnosis Date  . Anxiety   . Chronic insomnia   . Depression   . Gluten intolerance 2011   Dr Marquette Saa  . Pneumonia    childhood  . Seasonal allergic rhinitis   . Sleep disorder    Dr Beacher May, chronic and severe insomnia    Past Surgical History:  Procedure Laterality Date  . ANTERIOR CRUCIATE LIGAMENT REPAIR Right 08/2013   [name]  . KNEE ARTHROSCOPY Left 1995  . UPPER GASTROINTESTINAL ENDOSCOPY  2008   neg; Dr Watt Climes  . UPPER GASTROINTESTINAL ENDOSCOPY  2009    [name]    Family History  Problem Relation Age of Onset  . Anxiety disorder Mother   . Depression Mother   . Hypertension Mother   . Stroke Mother 43  . Heart failure Mother   . Heart attack Mother 34  . Breast cancer Mother 60  . COPD Mother     smoker  . Pulmonary embolism Father 13    post rotator cuff surgery  . Heart attack Paternal Grandfather 31  . Tremor Sister   . Tremor Sister     Social History:  reports that she has never smoked. She has never used smokeless tobacco. She reports that she drinks about 4.2 oz of alcohol per week . She reports that she does not use drugs.  Allergies:  Allergies  Allergen Reactions  . Gluten Meal Other (See Comments)    Gluten intolerant  . Venlafaxine     ineffective    No prescriptions prior to admission.    ROS  Neg except irreg  bleeding  Last menstrual period 04/11/2016. Physical Exam  Constitutional: She is oriented to person, place, and time. She appears well-developed and well-nourished.  HENT:  Head: Atraumatic.  Eyes: EOM are normal.  Neck: Neck supple.  Cardiovascular: Regular rhythm.   Respiratory: Breath sounds normal.  GI: Soft.  Genitourinary: Uterus normal.  Genitourinary Comments: Vulva nl cervix nl  Vagina posterior shortening Adnexa nl  Musculoskeletal: Normal range of motion.  Neurological: She is alert and oriented to person, place, and time.  Skin: Skin is warm and dry.  Psychiatric: She has a normal mood and affect.    No results found for this or any previous visit (from the past 24 hour(s)).  No results found.  Assessment/Plan: IMB Endometrial mass P) dx hysteroscopy, D&C, resection of endometrial mass Risk of surgery includes infection, bleeding, injury to surrounding organ structures, internal scar tissue, uterine perforation and its mgmt, thermal injury, fluid overload. All ? answered  [name] A 04/22/2016, 5:45 AM

## 2016-04-22 NOTE — Discharge Instructions (Signed)
CALL  IF TEMP>100.4, NOTHING PER VAGINA X 1WK, CALL IF SOAKING A MAXI  PAD EVERY HOUR OR MORE FREQUENTLY   DISCHARGE INSTRUCTIONS: HYSTEROSCOPY  The following instructions have been prepared to help you care for yourself upon your return home.  May Remove Scop patch on or before 04/25/16  May take Ibuprofen after 1:30pm today.    Personal hygiene:  Use sanitary pads for vaginal drainage, not tampons.  Shower the day after your procedure.  NO tub baths, pools or Jacuzzis for 2-3 weeks.  Wipe front to back after using the bathroom.  Activity and limitations:  Do NOT drive or operate any equipment for 24 hours. The effects of anesthesia are still present and drowsiness may result.  Do NOT rest in bed all day.  Walking is encouraged.  Walk up and down stairs slowly.  You may resume your normal activity in one to two days or as indicated by your physician. Sexual activity: NO intercourse for at least 2 weeks after the procedure, or as indicated by your Doctor.  Diet: Eat a light meal as desired this evening. You may resume your usual diet tomorrow.  Return to Work: You may resume your work activities in one to two days or as indicated by Marine scientist.  What to expect after your surgery: Expect to have vaginal bleeding/discharge for 2-3 days and spotting for up to 10 days. It is not unusual to have soreness for up to 1-2 weeks. You may have a slight burning sensation when you urinate for the first day. Mild cramps may continue for a couple of days. You may have a regular period in 2-6 weeks.  Call your doctor for any of the following:  Excessive vaginal bleeding or clotting, saturating and changing one pad every hour.  Inability to urinate 6 hours after discharge from hospital.  Pain not relieved by pain medication.  Fever of 100.4 F or greater.  Unusual vaginal discharge or odor.  Return to office _________________Call for an appointment  ___________________ Patients signature: ______________________ Nurses signature ________________________  Post Anesthesia Care Unit [phone]    Post Anesthesia Home Care Instructions  Activity: Get plenty of rest for the remainder of the day. A responsible adult should stay with you for 24 hours following the procedure.  For the next 24 hours, DO NOT: -Drive a car -Paediatric nurse -Drink alcoholic beverages -Take any medication unless instructed by your physician -Make any legal decisions or sign important papers.  Meals: Start with liquid foods such as gelatin or soup. Progress to regular foods as tolerated. Avoid greasy, spicy, heavy foods. If nausea and/or vomiting occur, drink only clear liquids until the nausea and/or vomiting subsides. Call your physician if vomiting continues.  Special Instructions/Symptoms: Your throat may feel dry or sore from the anesthesia or the breathing tube placed in your throat during surgery. If this causes discomfort, gargle with warm salt water. The discomfort should disappear within 24 hours.  If you had a scopolamine patch placed behind your ear for the management of post- operative nausea and/or vomiting:  1. The medication in the patch is effective for 72 hours, after which it should be removed.  Wrap patch in a tissue and discard in the trash. Wash hands thoroughly with soap and water. 2. You may remove the patch earlier than 72 hours if you experience unpleasant side effects which may include dry mouth, dizziness or visual disturbances. 3. Avoid touching the patch. Wash your hands with soap and water after contact  patch. °  ° ° °

## 2016-04-22 NOTE — Brief Op Note (Signed)
04/22/2016  8:05 AM  PATIENT:  Susan Fisher  50 y.o. female  PRE-OPERATIVE DIAGNOSIS:  IMB, Endometrial Mass  POST-OPERATIVE DIAGNOSIS: IMB,ENDOMETRIAL MASS    PROCEDURE:  Diagnostic hysteroscopy,hysteroscopic resection of endometrial polyp using myosure, dilation and curettage  SURGEON:  Surgeon(s) and Role:    * Servando Salina, MD - Primary  PHYSICIAN ASSISTANT:   ASSISTANTS: none   ANESTHESIA:   general Findings: right lateral/posterior endom polyp and aother smaller polypoid lesion, tubal ostia seen, nl endocervical canal EBL:  Total I/O In: 800 [I.V.:800] Out: 30 [Urine:20; Blood:10]  BLOOD ADMINISTERED:none  DRAINS: none   LOCAL MEDICATIONS USED:  NONE  SPECIMEN:  Source of Specimen:  emc with endometrial polyp  DISPOSITION OF SPECIMEN:  Source of Specimen:  emc with endometrial polyp  COUNTS:  YES  TOURNIQUET:  * No tourniquets in log *  DICTATION: .Other Dictation: Dictation Number [id number]  PLAN OF CARE: Discharge to home after PACU  PATIENT DISPOSITION:  PACU - hemodynamically stable.   Delay start of Pharmacological VTE agent (>24hrs) due to surgical blood loss or risk of bleeding: no

## 2016-04-22 NOTE — Anesthesia Procedure Notes (Signed)
Procedure Name: LMA Insertion Date/Time: 04/22/2016 7:32 AM Performed by: Tobin Chad Pre-anesthesia Checklist: Patient identified, Emergency Drugs available, Suction available and Patient being monitored Patient Re-evaluated:Patient Re-evaluated prior to inductionOxygen Delivery Method: Circle system utilized and Simple face mask Preoxygenation: Pre-oxygenation with 100% oxygen Intubation Type: IV induction Ventilation: Mask ventilation without difficulty LMA: LMA inserted LMA Size: 4.0 Grade View: Grade II Number of attempts: 1

## 2016-04-23 ENCOUNTER — Encounter (HOSPITAL_COMMUNITY): Payer: Self-pay | Admitting: Obstetrics and Gynecology

## 2016-05-11 IMAGING — MG MM DIGITAL SCREENING BILAT
5 series · 5 of 5 positions shown · non-contrast
Comparison: Previous exam(s).

CLINICAL DATA: Screening.

EXAM:
DIGITAL SCREENING BILATERAL MAMMOGRAM WITH CAD

[R CC (1 of 2)]
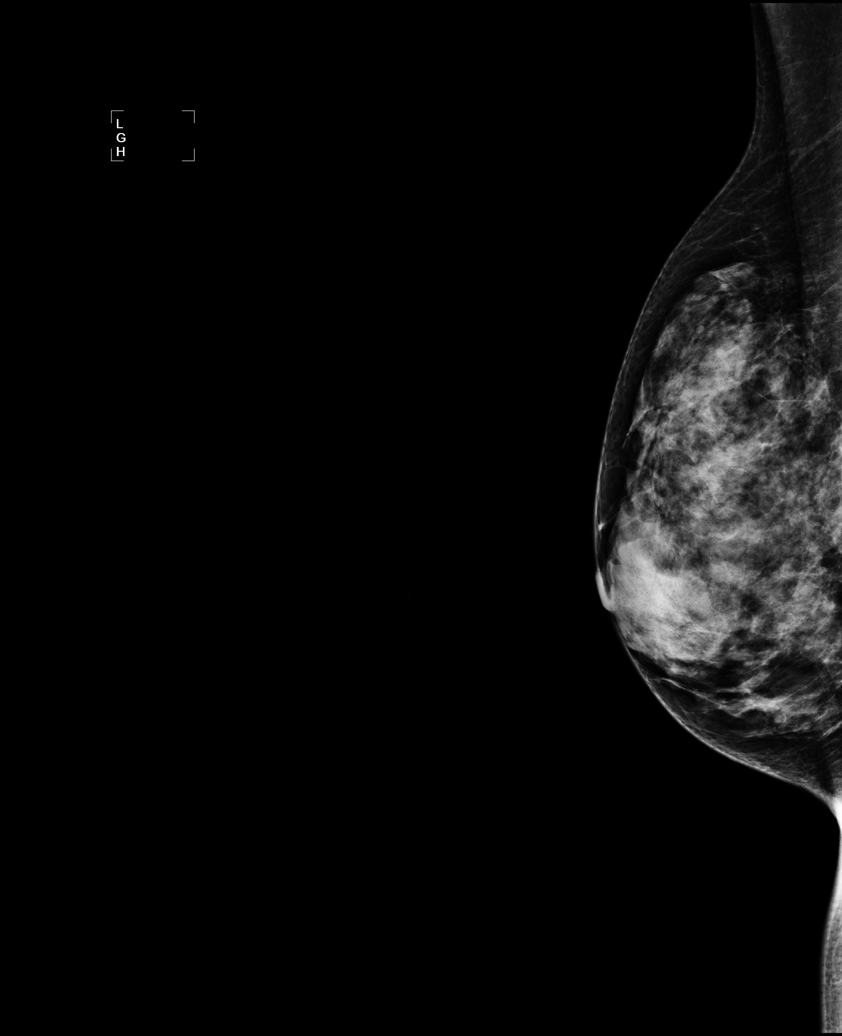

[L MLO]
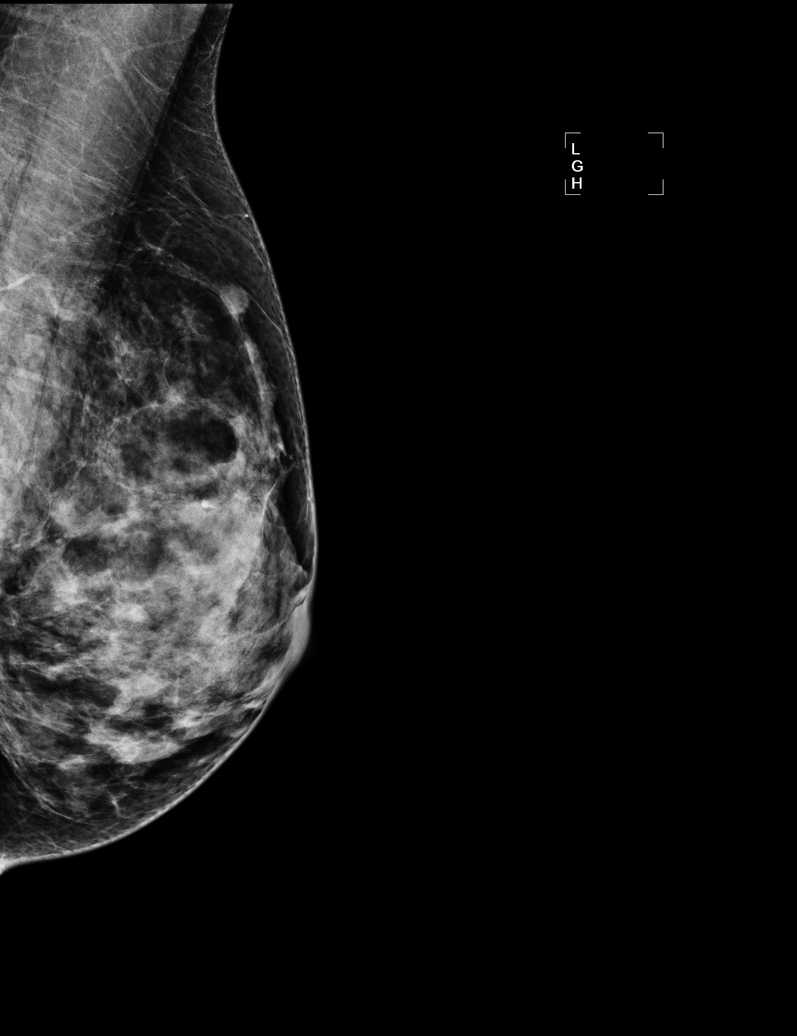

[R MLO]
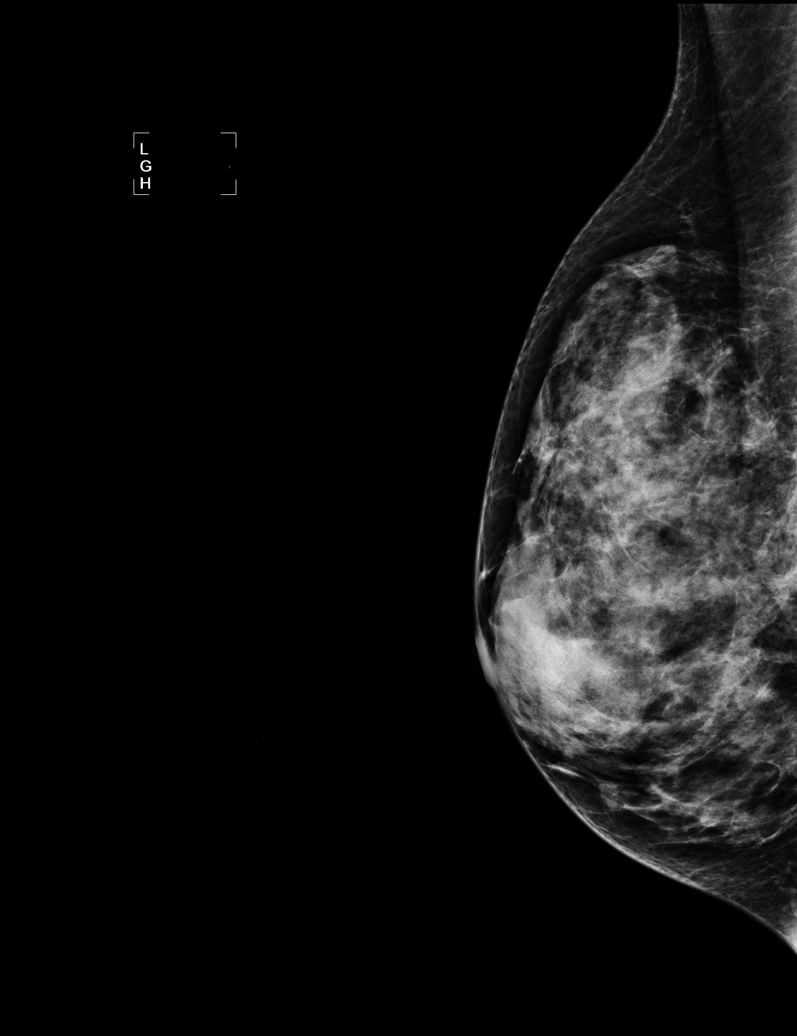

[R CC (2 of 2)]
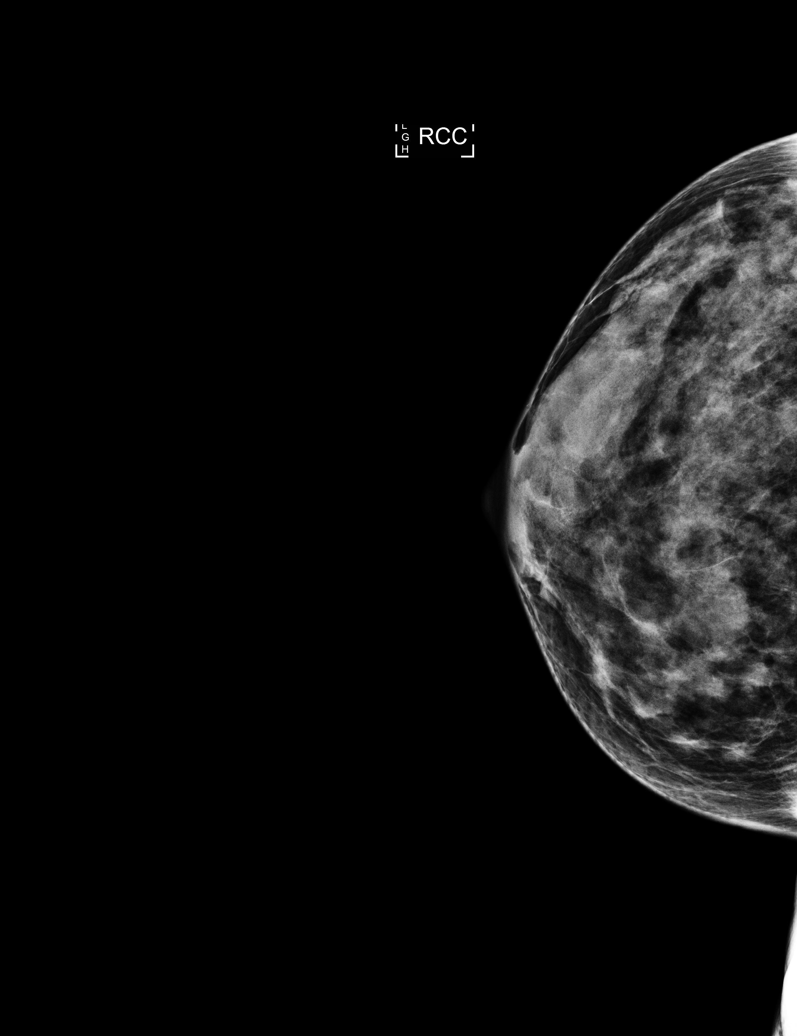

[L CC]
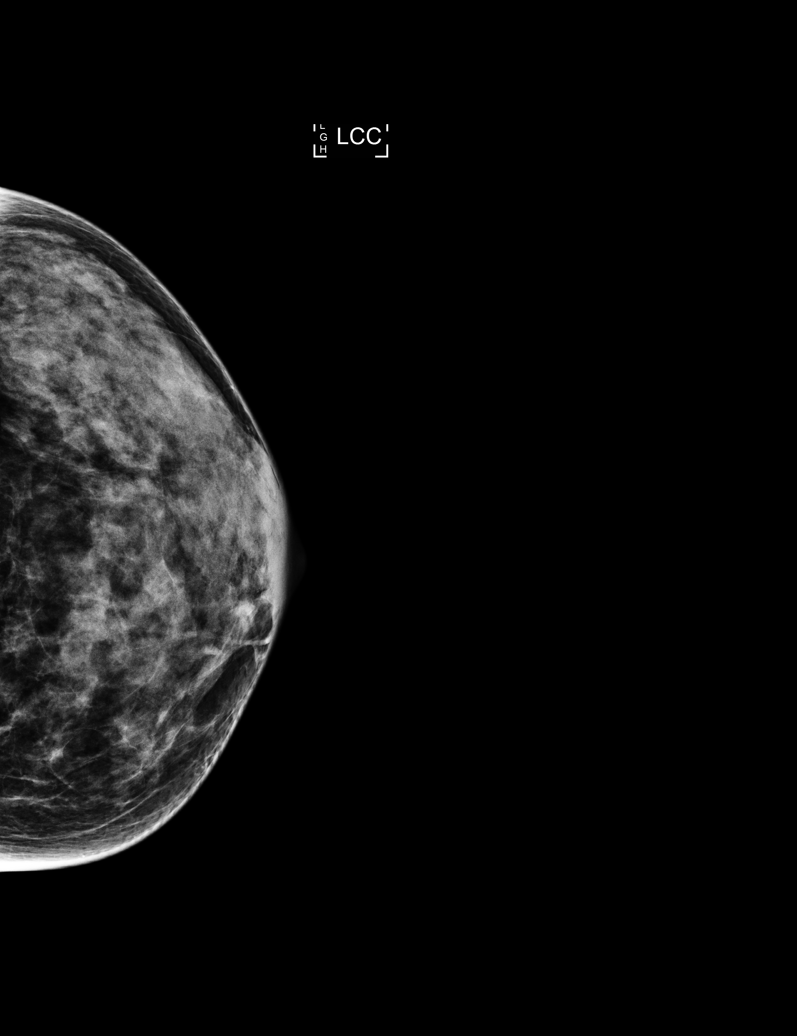

[5 of 5 positions shown; findings below may reference images not displayed]

ACR Breast Density Category d: The breast tissue is extremely dense,
which lowers the sensitivity of mammography.
FINDINGS: There are no findings suspicious for malignancy. Images were
processed with CAD.
IMPRESSION: No mammographic evidence of malignancy. A result letter of this
screening mammogram will be mailed directly to the patient.

RECOMMENDATION:
Screening mammogram in one year. (Code:[id number]-[id number])

BI-RADS CATEGORY  1: Negative.

## 2016-05-24 ENCOUNTER — Other Ambulatory Visit: Payer: Self-pay | Admitting: Neurology

## 2016-05-24 DIAGNOSIS — F5102 Adjustment insomnia: Secondary | ICD-10-CM

## 2016-05-24 MED ORDER — AMPHETAMINE-DEXTROAMPHET ER 15 MG PO CP24: 15.0000 mg | ORAL_CAPSULE | Freq: Every day | ORAL | 0 refills | Status: DC

## 2016-05-24 NOTE — Telephone Encounter (Signed)
Pt called requesting refill for amphetamine-dextroamphetamine (ADDERALL XR) 15 MG 24 hr capsule.

## 2016-05-24 NOTE — Addendum Note (Signed)
Addended by: Lester Peterman A on: 05/24/2016 10:16 AM   Modules accepted: Orders

## 2016-05-24 NOTE — Telephone Encounter (Signed)
I spoke to pt and advised her that her adderall RX is available for pick up at the front desk. Pt verbalized understanding.

## 2016-07-02 ENCOUNTER — Telehealth: Payer: Self-pay | Admitting: Neurology

## 2016-07-02 DIAGNOSIS — F5102 Adjustment insomnia: Secondary | ICD-10-CM

## 2016-07-02 MED ORDER — AMPHETAMINE-DEXTROAMPHET ER 15 MG PO CP24: 15.0000 mg | ORAL_CAPSULE | Freq: Every day | ORAL | 0 refills | Status: DC

## 2016-07-02 NOTE — Telephone Encounter (Signed)
Pt request refill for amphetamine-dextroamphetamine (ADDERALL XR) 15 MG 24 hr capsule °

## 2016-07-02 NOTE — Telephone Encounter (Signed)
Rx printed and waiting for signature 

## 2016-07-02 NOTE — Addendum Note (Signed)
Addended by: Laurence Spates on: 07/02/2016 12:29 PM   Modules accepted: Orders

## 2016-07-05 NOTE — Telephone Encounter (Signed)
I called pt. Her RX for adderall is ready for pick up at the front desk. No answer, left a VM asking her to call me back. If pt calls back, please advise her of this information.

## 2016-07-05 NOTE — Telephone Encounter (Signed)
Patient called office was notified that Adderall prescription is ready for pick up.

## 2016-08-02 DIAGNOSIS — B373 Candidiasis of vulva and vagina: Secondary | ICD-10-CM | POA: Diagnosis not present

## 2016-08-02 DIAGNOSIS — N8500 Endometrial hyperplasia, unspecified: Secondary | ICD-10-CM | POA: Diagnosis not present

## 2016-08-02 DIAGNOSIS — N938 Other specified abnormal uterine and vaginal bleeding: Secondary | ICD-10-CM | POA: Diagnosis not present

## 2016-08-23 ENCOUNTER — Telehealth: Payer: Self-pay | Admitting: Neurology

## 2016-08-23 DIAGNOSIS — F5102 Adjustment insomnia: Secondary | ICD-10-CM

## 2016-08-23 DIAGNOSIS — M25521 Pain in right elbow: Secondary | ICD-10-CM | POA: Diagnosis not present

## 2016-08-23 MED ORDER — AMPHETAMINE-DEXTROAMPHET ER 15 MG PO CP24: 15.0000 mg | ORAL_CAPSULE | Freq: Every day | ORAL | 0 refills | Status: DC

## 2016-08-23 NOTE — Addendum Note (Signed)
Addended by: Lester Winthrop A on: 08/23/2016 02:12 PM   Modules accepted: Orders

## 2016-08-23 NOTE — Telephone Encounter (Signed)
Pt request refill for amphetamine-dextroamphetamine (ADDERALL XR) 15 MG 24 hr capsule npt advised RX should be ready within 24hrs unless otherwise notified by RN

## 2016-08-23 NOTE — Telephone Encounter (Signed)
RX signed by Dr. Felecia Shelling, placed at the front desk for pick up.

## 2016-08-23 NOTE — Telephone Encounter (Signed)
Pt is up to date on her appts and is due for an adderall refill. Will give to Dr. Felecia Shelling, [facility], in Dr. Edwena Felty absence to review.

## 2016-08-24 ENCOUNTER — Other Ambulatory Visit: Payer: Self-pay | Admitting: Obstetrics and Gynecology

## 2016-08-24 ENCOUNTER — Encounter (HOSPITAL_COMMUNITY): Payer: Self-pay

## 2016-08-31 ENCOUNTER — Ambulatory Visit (HOSPITAL_COMMUNITY): Payer: BLUE CROSS/BLUE SHIELD | Admitting: Anesthesiology

## 2016-08-31 ENCOUNTER — Encounter (HOSPITAL_COMMUNITY)
Admission: RE | Disposition: A | Discharge status: Home / Self Care | Payer: Self-pay | Source: Ambulatory Visit | Attending: Obstetrics and Gynecology

## 2016-08-31 ENCOUNTER — Ambulatory Visit (HOSPITAL_COMMUNITY)
Admission: RE | Admit: 2016-08-31 | Discharge: 2016-08-31 | Disposition: A | Discharge status: Home / Self Care | Payer: BLUE CROSS/BLUE SHIELD | Source: Ambulatory Visit | Attending: Obstetrics and Gynecology | Admitting: Obstetrics and Gynecology

## 2016-08-31 ENCOUNTER — Encounter (HOSPITAL_COMMUNITY): Payer: Self-pay | Admitting: *Deleted

## 2016-08-31 DIAGNOSIS — N921 Excessive and frequent menstruation with irregular cycle: Secondary | ICD-10-CM | POA: Insufficient documentation

## 2016-08-31 DIAGNOSIS — N858 Other specified noninflammatory disorders of uterus: Secondary | ICD-10-CM | POA: Diagnosis not present

## 2016-08-31 DIAGNOSIS — Z793 Long term (current) use of hormonal contraceptives: Secondary | ICD-10-CM | POA: Insufficient documentation

## 2016-08-31 DIAGNOSIS — F329 Major depressive disorder, single episode, unspecified: Secondary | ICD-10-CM | POA: Insufficient documentation

## 2016-08-31 DIAGNOSIS — Z79899 Other long term (current) drug therapy: Secondary | ICD-10-CM | POA: Insufficient documentation

## 2016-08-31 DIAGNOSIS — N840 Polyp of corpus uteri: Secondary | ICD-10-CM | POA: Diagnosis not present

## 2016-08-31 DIAGNOSIS — N8500 Endometrial hyperplasia, unspecified: Secondary | ICD-10-CM | POA: Diagnosis not present

## 2016-08-31 DIAGNOSIS — G4700 Insomnia, unspecified: Secondary | ICD-10-CM | POA: Diagnosis not present

## 2016-08-31 DIAGNOSIS — F419 Anxiety disorder, unspecified: Secondary | ICD-10-CM | POA: Diagnosis not present

## 2016-08-31 HISTORY — DX: Sprain of anterior cruciate ligament of unspecified knee, initial encounter: S83.519A

## 2016-08-31 HISTORY — PX: DILATATION & CURETTAGE/HYSTEROSCOPY WITH MYOSURE: SHX6511

## 2016-08-31 LAB — CBC
HCT: 47.5 % — ABNORMAL HIGH (ref 36.0–46.0)
HEMOGLOBIN: 15.8 g/dL — AB (ref 12.0–15.0)
MCH: 30.6 pg (ref 26.0–34.0)
MCHC: 33.3 g/dL (ref 30.0–36.0)
MCV: 92.1 fL (ref 78.0–100.0)
Platelets: 259 10*3/uL (ref 150–400)
RBC: 5.16 MIL/uL — ABNORMAL HIGH (ref 3.87–5.11)
RDW: 13.7 % (ref 11.5–15.5)
WBC: 13.3 10*3/uL — ABNORMAL HIGH (ref 4.0–10.5)

## 2016-08-31 SURGERY — DILATATION & CURETTAGE/HYSTEROSCOPY WITH MYOSURE
Anesthesia: General | Site: Vagina

## 2016-08-31 MED ORDER — FENTANYL CITRATE (PF) 100 MCG/2ML IJ SOLN
INTRAMUSCULAR | Status: AC
  Administered 2016-08-31: 25 ug via INTRAVENOUS
  Filled 2016-08-31: qty 2

## 2016-08-31 MED ORDER — LIDOCAINE HCL (CARDIAC) 20 MG/ML IV SOLN
INTRAVENOUS | Status: AC
  Filled 2016-08-31: qty 5

## 2016-08-31 MED ORDER — DEXAMETHASONE SODIUM PHOSPHATE 10 MG/ML IJ SOLN
INTRAMUSCULAR | Status: AC
  Filled 2016-08-31: qty 1

## 2016-08-31 MED ORDER — LIDOCAINE HCL (CARDIAC) 20 MG/ML IV SOLN
INTRAVENOUS | Status: DC | PRN
  Administered 2016-08-31: 40 mg via INTRAVENOUS

## 2016-08-31 MED ORDER — SCOPOLAMINE 1 MG/3DAYS TD PT72
MEDICATED_PATCH | TRANSDERMAL | Status: AC
  Filled 2016-08-31: qty 1

## 2016-08-31 MED ORDER — FENTANYL CITRATE (PF) 100 MCG/2ML IJ SOLN
INTRAMUSCULAR | Status: DC | PRN
  Administered 2016-08-31 (×2): 50 ug via INTRAVENOUS

## 2016-08-31 MED ORDER — PROPOFOL 10 MG/ML IV BOLUS
INTRAVENOUS | Status: DC | PRN
  Administered 2016-08-31: 150 mg via INTRAVENOUS
  Administered 2016-08-31: 50 mg via INTRAVENOUS

## 2016-08-31 MED ORDER — ONDANSETRON HCL 4 MG/2ML IJ SOLN: 4.0000 mg | Freq: Four times a day (QID) | INTRAMUSCULAR | Status: DC | PRN

## 2016-08-31 MED ORDER — FENTANYL CITRATE (PF) 100 MCG/2ML IJ SOLN
INTRAMUSCULAR | Status: AC
  Filled 2016-08-31: qty 2

## 2016-08-31 MED ORDER — DEXAMETHASONE SODIUM PHOSPHATE 10 MG/ML IJ SOLN
INTRAMUSCULAR | Status: DC | PRN
  Administered 2016-08-31: 4 mg via INTRAVENOUS

## 2016-08-31 MED ORDER — MIDAZOLAM HCL 5 MG/5ML IJ SOLN
INTRAMUSCULAR | Status: DC | PRN
  Administered 2016-08-31: 2 mg via INTRAVENOUS

## 2016-08-31 MED ORDER — MIDAZOLAM HCL 2 MG/2ML IJ SOLN
INTRAMUSCULAR | Status: AC
  Filled 2016-08-31: qty 2

## 2016-08-31 MED ORDER — ONDANSETRON HCL 4 MG/2ML IJ SOLN
INTRAMUSCULAR | Status: AC
  Filled 2016-08-31: qty 2

## 2016-08-31 MED ORDER — PROPOFOL 10 MG/ML IV BOLUS
INTRAVENOUS | Status: AC
  Filled 2016-08-31: qty 20

## 2016-08-31 MED ORDER — KETOROLAC TROMETHAMINE 30 MG/ML IJ SOLN
INTRAMUSCULAR | Status: AC
  Filled 2016-08-31: qty 1

## 2016-08-31 MED ORDER — OXYCODONE HCL 5 MG PO TABS: 5.0000 mg | ORAL_TABLET | Freq: Once | ORAL | Status: DC | PRN

## 2016-08-31 MED ORDER — FENTANYL CITRATE (PF) 100 MCG/2ML IJ SOLN
25.0000 ug | INTRAMUSCULAR | Status: DC | PRN
  Administered 2016-08-31: 25 ug via INTRAVENOUS

## 2016-08-31 MED ORDER — SCOPOLAMINE 1 MG/3DAYS TD PT72
1.0000 | MEDICATED_PATCH | Freq: Once | TRANSDERMAL | Status: DC
  Administered 2016-08-31: 1.5 mg via TRANSDERMAL

## 2016-08-31 MED ORDER — SCOPOLAMINE 1 MG/3DAYS TD PT72
MEDICATED_PATCH | TRANSDERMAL | Status: AC
  Administered 2016-08-31: 1.5 mg via TRANSDERMAL
  Filled 2016-08-31: qty 1

## 2016-08-31 MED ORDER — KETOROLAC TROMETHAMINE 60 MG/2ML IM SOLN
INTRAMUSCULAR | Status: DC | PRN
  Administered 2016-08-31: 30 mg via INTRAMUSCULAR

## 2016-08-31 MED ORDER — LACTATED RINGERS IV SOLN
INTRAVENOUS | Status: DC
  Administered 2016-08-31: 10:00:00 via INTRAVENOUS
  Administered 2016-08-31: 125 mL/h via INTRAVENOUS

## 2016-08-31 MED ORDER — SODIUM CHLORIDE 0.9 % IR SOLN
Status: DC | PRN
  Administered 2016-08-31: 3000 mL

## 2016-08-31 MED ORDER — KETOROLAC TROMETHAMINE 30 MG/ML IJ SOLN
INTRAMUSCULAR | Status: DC | PRN
  Administered 2016-08-31: 30 mg via INTRAVENOUS

## 2016-08-31 MED ORDER — OXYCODONE HCL 5 MG/5ML PO SOLN: 5.0000 mg | Freq: Once | ORAL | Status: DC | PRN

## 2016-08-31 MED ORDER — ONDANSETRON HCL 4 MG/2ML IJ SOLN
INTRAMUSCULAR | Status: DC | PRN
Start: 2016-08-31 — End: 2016-08-31
  Administered 2016-08-31: 4 mg via INTRAVENOUS

## 2016-08-31 SURGICAL SUPPLY — 19 items
CANISTER SUCT 3000ML PPV (MISCELLANEOUS) ×2 IMPLANT
CATH ROBINSON RED A/P 16FR (CATHETERS) ×2 IMPLANT
CLOTH BEACON ORANGE TIMEOUT ST (SAFETY) ×2 IMPLANT
CONTAINER PREFILL 10% NBF 60ML (FORM) ×4 IMPLANT
DEVICE MYOSURE LITE (MISCELLANEOUS) ×1 IMPLANT
DEVICE MYOSURE REACH (MISCELLANEOUS) IMPLANT
ELECT REM PT RETURN 9FT ADLT (ELECTROSURGICAL) ×2
ELECTRODE REM PT RTRN 9FT ADLT (ELECTROSURGICAL) ×1 IMPLANT
FILTER ARTHROSCOPY CONVERTOR (FILTER) ×2 IMPLANT
GLOVE BIOGEL PI IND STRL 7.0 (GLOVE) ×2 IMPLANT
GLOVE BIOGEL PI INDICATOR 7.0 (GLOVE) ×2
GLOVE ECLIPSE 6.5 STRL STRAW (GLOVE) ×2 IMPLANT
GOWN STRL REUS W/TWL LRG LVL3 (GOWN DISPOSABLE) ×4 IMPLANT
PACK VAGINAL MINOR WOMEN LF (CUSTOM PROCEDURE TRAY) ×2 IMPLANT
PAD OB MATERNITY 4.3X12.25 (PERSONAL CARE ITEMS) ×2 IMPLANT
SEAL ROD LENS SCOPE MYOSURE (ABLATOR) ×2 IMPLANT
TOWEL OR 17X24 6PK STRL BLUE (TOWEL DISPOSABLE) ×4 IMPLANT
TUBING AQUILEX INFLOW (TUBING) ×2 IMPLANT
TUBING AQUILEX OUTFLOW (TUBING) ×2 IMPLANT

## 2016-08-31 NOTE — Anesthesia Postprocedure Evaluation (Signed)
Anesthesia Post Note  Patient: Susan Fisher  Procedure(s) Performed: Procedure(s) (LRB): DILATATION & CURETTAGE/HYSTEROSCOPY WITH MYOSURE (N/A)  Patient location during evaluation: PACU Anesthesia Type: General Level of consciousness: awake and alert and patient cooperative Pain management: pain level controlled Vital Signs Assessment: post-procedure vital signs reviewed and stable Respiratory status: spontaneous breathing and respiratory function stable Cardiovascular status: stable Anesthetic complications: no        Last Vitals:  Vitals:   08/31/16 1200 08/31/16 1215  BP: (!) 142/87 126/88  Pulse: 65 80  Resp: 16 18  Temp:  36.9 C    Last Pain:  Vitals:   08/31/16 1215  TempSrc:   PainSc: 2    Pain Goal: Patients Stated Pain Goal: 4 (08/31/16 1215)               [place]

## 2016-08-31 NOTE — Anesthesia Procedure Notes (Signed)
Procedure Name: LMA Insertion Date/Time: 08/31/2016 10:29 AM Performed by: Barkley Boards L Pre-anesthesia Checklist: Patient identified, Emergency Drugs available, Suction available and Patient being monitored Patient Re-evaluated:Patient Re-evaluated prior to inductionOxygen Delivery Method: Circle system utilized Preoxygenation: Pre-oxygenation with 100% oxygen Intubation Type: IV induction Ventilation: Mask ventilation without difficulty LMA: LMA inserted LMA Size: 4.0 Number of attempts: 1 Placement Confirmation: positive ETCO2 and breath sounds checked- equal and bilateral

## 2016-08-31 NOTE — Discharge Instructions (Signed)
DISCHARGE INSTRUCTIONS: HYSTEROSCOPY / ENDOMETRIAL ABLATION °The following instructions have been prepared to help you care for yourself upon your return home. ° °May Remove Scop patch on or before ° °May take Ibuprofen after ° °May take stool softner while taking narcotic pain medication to prevent constipation.  Drink plenty of water. ° °Personal hygiene: °• Use sanitary pads for vaginal drainage, not tampons. °• Shower the day after your procedure. °• NO tub baths, pools or Jacuzzis for 2-3 weeks. °• Wipe front to back after using the bathroom. ° °Activity and limitations: °• Do NOT drive or operate any equipment for 24 hours. The effects of anesthesia are still present °and drowsiness may result. °• Do NOT rest in bed all day. °• Walking is encouraged. °• Walk up and down stairs slowly. °• You may resume your normal activity in one to two days or as indicated by your physician. °Sexual activity: NO intercourse for at least 2 weeks after the procedure, or as indicated by your °Doctor. ° °Diet: Eat a light meal as desired this evening. You may resume your usual diet tomorrow. ° °Return to Work: You may resume your work activities in one to two days or as indicated by your °Doctor. ° °What to expect after your surgery: Expect to have vaginal bleeding/discharge for 2-3 days and °spotting for up to 10 days. It is not unusual to have soreness for up to 1-2 weeks. You may have a °slight burning sensation when you urinate for the first day. Mild cramps may continue for a couple of °days. You may have a regular period in 2-6 weeks. ° °Call your doctor for any of the following: °• Excessive vaginal bleeding or clotting, saturating and changing one pad every hour. °• Inability to urinate 6 hours after discharge from hospital. °• Pain not relieved by pain medication. °• Fever of 100.4° F or greater. °• Unusual vaginal discharge or odor. ° °Return to office _________________Call for an appointment  ___________________ °Patient’s signature: ______________________ °Nurse’s signature ________________________ ° °Post Anesthesia Care Unit [phone] °Post Anesthesia Home Care Instructions ° °Activity: °Get plenty of rest for the remainder of the day. A responsible individual must stay with you for 24 hours following the procedure.  °For the next 24 hours, DO NOT: °-Drive a car °-Operate machinery °-Drink alcoholic beverages °-Take any medication unless instructed by your physician °-Make any legal decisions or sign important papers. ° °Meals: °Start with liquid foods such as gelatin or soup. Progress to regular foods as tolerated. Avoid greasy, spicy, heavy foods. If nausea and/or vomiting occur, drink only clear liquids until the nausea and/or vomiting subsides. Call your physician if vomiting continues. ° °Special Instructions/Symptoms: °Your throat may feel dry or sore from the anesthesia or the breathing tube placed in your throat during surgery. If this causes discomfort, gargle with warm salt water. The discomfort should disappear within 24 hours. ° °If you had a scopolamine patch placed behind your ear for the management of post- operative nausea and/or vomiting: ° °1. The medication in the patch is effective for 72 hours, after which it should be removed.  Wrap patch in a tissue and discard in the trash. Wash hands thoroughly with soap and water. °2. You may remove the patch earlier than 72 hours if you experience unpleasant side effects which may include dry mouth, dizziness or visual disturbances. °3. Avoid touching the patch. Wash your hands with soap and water after contact with the patch. °  ° °

## 2016-08-31 NOTE — Transfer of Care (Signed)
Immediate Anesthesia Transfer of Care Note  Patient: Susan Fisher  Procedure(s) Performed: Procedure(s): DILATATION & CURETTAGE/HYSTEROSCOPY WITH MYOSURE (N/A)  Patient Location: PACU  Anesthesia Type:General  Level of Consciousness: awake  Airway & Oxygen Therapy: Patient Spontanous Breathing and Patient connected to nasal cannula oxygen  Post-op Assessment: Report given to RN and Post -op Vital signs reviewed and stable  Post vital signs: stable  Last Vitals:  Vitals:   08/31/16 0935 08/31/16 1107  BP: 122/88 (!) (P) 145/98  Pulse: 69 (P) 75  Resp: 18 (P) 15  Temp: 36.6 C     Last Pain:  Vitals:   08/31/16 0935  TempSrc: Oral      Patients Stated Pain Goal: 4 (44/92/01 0071)  Complications: No apparent anesthesia complications

## 2016-08-31 NOTE — Anesthesia Preprocedure Evaluation (Signed)
Anesthesia Evaluation  Patient identified by MRN, date of birth, ID band Patient awake    Reviewed: Allergy & Precautions, H&P , NPO status , Patient's Chart, lab work & pertinent test results  Airway Mallampati: II   Neck ROM: full    Dental   Pulmonary neg pulmonary ROS,    breath sounds clear to auscultation       Cardiovascular negative cardio ROS   Rhythm:regular Rate:Normal     Neuro/Psych PSYCHIATRIC DISORDERS Anxiety Depression    GI/Hepatic   Endo/Other    Renal/GU      Musculoskeletal   Abdominal   Peds  Hematology   Anesthesia Other Findings   Reproductive/Obstetrics                             Anesthesia Physical Anesthesia Plan  ASA: II  Anesthesia Plan: General   Post-op Pain Management:    Induction: Intravenous  Airway Management Planned: LMA  Additional Equipment:   Intra-op Plan:   Post-operative Plan:   Informed Consent: I have reviewed the patients History and Physical, chart, labs and discussed the procedure including the risks, benefits and alternatives for the proposed anesthesia with the patient or authorized representative who has indicated his/her understanding and acceptance.     Plan Discussed with: CRNA, Anesthesiologist and Surgeon  Anesthesia Plan Comments:         Anesthesia Quick Evaluation

## 2016-08-31 NOTE — Brief Op Note (Signed)
08/31/2016  11:15 AM  PATIENT:  Susan Fisher  51 y.o. female  PRE-OPERATIVE DIAGNOSIS:  Endometrial Hyperplasia  POST-OPERATIVE DIAGNOSIS:  Endometrial Hyperplasia, endometrial polyp  PROCEDURE:  Diagnostic hysteroscopy, dilation and curettage, hysteroscopic resection of endometrial polyp  SURGEON:  Surgeon(s) and Role:    * Servando Salina, MD - Primary  PHYSICIAN ASSISTANT:   ASSISTANTS: none   ANESTHESIA:   general Findings: small posterior polyp, ostia seen bilaterally EBL:  Total I/O In: 900 [I.V.:900] Out: 80 [Urine:75; Blood:5]  BLOOD ADMINISTERED:none  DRAINS: none   LOCAL MEDICATIONS USED:  NONE  SPECIMEN:  Source of Specimen:  emc  DISPOSITION OF SPECIMEN:  PATHOLOGY  COUNTS:  YES  TOURNIQUET:  * No tourniquets in log *  DICTATION: .Other Dictation: Dictation Number [id number]  PLAN OF CARE: Discharge to home after PACU  PATIENT DISPOSITION:  PACU - hemodynamically stable.   Delay start of Pharmacological VTE agent (>24hrs) due to surgical blood loss or risk of bleeding: no

## 2016-09-01 ENCOUNTER — Encounter (HOSPITAL_COMMUNITY): Payer: Self-pay | Admitting: Obstetrics and Gynecology

## 2016-09-01 NOTE — Op Note (Signed)
Susan Fisher, Susan Fisher NO.:  [id number]  MEDICAL RECORD NO.:  [id number]  LOCATION:                                FACILITY:  [facility]  PHYSICIAN:  Servando Salina, M.D.DATE OF BIRTH:  02-Apr-1966  DATE OF PROCEDURE:  08/31/2016 DATE OF DISCHARGE:  08/31/2016                              OPERATIVE REPORT   PREOPERATIVE DIAGNOSIS:  Endometrial hyperplasia.  PROCEDURE:  Diagnostic hysteroscopy, hysteroscopic resection of endometrial polyp, D and C.  POSTOPERATIVE DIAGNOSES:  Endometrial hyperplasia, endometrial polyp.  ANESTHESIA:  General.  SURGEON:  Servando Salina, M.D.  ASSISTANT:  None.  DESCRIPTION OF PROCEDURE:  Under adequate general anesthesia, the patient was placed in the dorsal lithotomy position.  She was sterilely prepped and draped in usual fashion.  Bladder was catheterized to moderate amount of urine.  Examination under anesthesia revealed an anteflexed uterus.  No adnexal masses could be appreciated.  Bivalve speculum was placed in vagina.  Single-tooth tenaculum was placed on the anterior lip of the cervix.  The cervix was then carefully dilated up to #21 [facility] dilator.  The MyoSure hysteroscope was introduced into the uterine cavity.  Small polypoid lesion was noted on the posterior wall. Both tubal ostia could be seen.  Using the LITE MyoSure resectoscope, the entire wall including the endometrial polyp was resected.  The hysteroscope was then removed.  The cavity was then gently resected for scant amount of tissue.  All instruments were then removed from the vagina.  SPECIMEN:  Labeled endometrial curettings were sent to Pathology.  ESTIMATED BLOOD LOSS:  Minimal.  COMPLICATIONS:  None.  The patient tolerated the procedure well and was transferred to recovery in stable condition.     Servando Salina, M.D.    [place]/MEDQ  D:  08/31/2016  T:  08/31/2016  Job:  [id number]

## 2016-10-19 ENCOUNTER — Other Ambulatory Visit: Payer: Self-pay | Admitting: Neurology

## 2016-10-19 DIAGNOSIS — F5102 Adjustment insomnia: Secondary | ICD-10-CM

## 2016-10-19 MED ORDER — AMPHETAMINE-DEXTROAMPHET ER 15 MG PO CP24: 15.0000 mg | ORAL_CAPSULE | Freq: Every day | ORAL | 0 refills | Status: DC

## 2016-10-19 NOTE — Telephone Encounter (Signed)
Patient called office requesting refill for amphetamine-dextroamphetamine (ADDERALL XR) 15 MG 24 hr capsule °

## 2016-10-19 NOTE — Telephone Encounter (Signed)
I called pt and advised her that her RX for adderall is ready for pick up at the front desk. Pt verbalized understanding of this and of clinic hours.

## 2016-10-19 NOTE — Addendum Note (Signed)
Addended by: Lester Weskan A on: 10/19/2016 12:57 PM   Modules accepted: Orders

## 2016-11-03 DIAGNOSIS — H18832 Recurrent erosion of cornea, left eye: Secondary | ICD-10-CM | POA: Diagnosis not present

## 2016-11-03 DIAGNOSIS — H04123 Dry eye syndrome of bilateral lacrimal glands: Secondary | ICD-10-CM | POA: Diagnosis not present

## 2016-11-03 DIAGNOSIS — H10413 Chronic giant papillary conjunctivitis, bilateral: Secondary | ICD-10-CM | POA: Diagnosis not present

## 2016-11-03 DIAGNOSIS — H01021 Squamous blepharitis right upper eyelid: Secondary | ICD-10-CM | POA: Diagnosis not present

## 2016-11-09 DIAGNOSIS — M25521 Pain in right elbow: Secondary | ICD-10-CM | POA: Diagnosis not present

## 2016-11-11 DIAGNOSIS — H18832 Recurrent erosion of cornea, left eye: Secondary | ICD-10-CM | POA: Diagnosis not present

## 2016-11-11 DIAGNOSIS — H16142 Punctate keratitis, left eye: Secondary | ICD-10-CM | POA: Diagnosis not present

## 2016-11-11 DIAGNOSIS — H10413 Chronic giant papillary conjunctivitis, bilateral: Secondary | ICD-10-CM | POA: Diagnosis not present

## 2016-11-15 DIAGNOSIS — H16102 Unspecified superficial keratitis, left eye: Secondary | ICD-10-CM | POA: Diagnosis not present

## 2016-11-15 DIAGNOSIS — H16223 Keratoconjunctivitis sicca, not specified as Sjogren's, bilateral: Secondary | ICD-10-CM | POA: Diagnosis not present

## 2016-11-15 DIAGNOSIS — H04123 Dry eye syndrome of bilateral lacrimal glands: Secondary | ICD-10-CM | POA: Diagnosis not present

## 2016-11-16 DIAGNOSIS — M25521 Pain in right elbow: Secondary | ICD-10-CM | POA: Diagnosis not present

## 2016-11-22 ENCOUNTER — Encounter: Payer: Self-pay | Admitting: Family Medicine

## 2016-11-22 ENCOUNTER — Ambulatory Visit (INDEPENDENT_AMBULATORY_CARE_PROVIDER_SITE_OTHER): Payer: BLUE CROSS/BLUE SHIELD | Admitting: Family Medicine

## 2016-11-22 VITALS — BP 110/72 | HR 70 | Ht 58.5 in | Wt 120.0 lb

## 2016-11-22 DIAGNOSIS — H16223 Keratoconjunctivitis sicca, not specified as Sjogren's, bilateral: Secondary | ICD-10-CM | POA: Diagnosis not present

## 2016-11-22 DIAGNOSIS — H18832 Recurrent erosion of cornea, left eye: Secondary | ICD-10-CM | POA: Diagnosis not present

## 2016-11-22 DIAGNOSIS — H04123 Dry eye syndrome of bilateral lacrimal glands: Secondary | ICD-10-CM | POA: Diagnosis not present

## 2016-11-22 DIAGNOSIS — M1990 Unspecified osteoarthritis, unspecified site: Secondary | ICD-10-CM

## 2016-11-22 LAB — COMPREHENSIVE METABOLIC PANEL
ALT: 12 U/L (ref 6–29)
AST: 19 U/L (ref 10–35)
Albumin: 4.3 g/dL (ref 3.6–5.1)
Alkaline Phosphatase: 50 U/L (ref 33–130)
BUN: 18 mg/dL (ref 7–25)
CO2: 22 mmol/L (ref 20–31)
CREATININE: 0.76 mg/dL (ref 0.50–1.05)
Calcium: 9.3 mg/dL (ref 8.6–10.4)
Chloride: 103 mmol/L (ref 98–110)
GLUCOSE: 76 mg/dL (ref 65–99)
Potassium: 4.4 mmol/L (ref 3.5–5.3)
SODIUM: 137 mmol/L (ref 135–146)
Total Bilirubin: 0.6 mg/dL (ref 0.2–1.2)
Total Protein: 6.4 g/dL (ref 6.1–8.1)

## 2016-11-22 NOTE — Progress Notes (Signed)
   Subjective:    Patient ID: [name], female    DOB: [birth year], 51 y.o.   MRN: [id number]  HPI She is being followed at [facility] eye care for treatment of intermittent an 8 month history of dry eyes and occasional corneal abrasion. She also does complain of elbow and knee pain and has been seen by Dr. [name] for elbow pain. She does not complain of dry mouth, dental problems, chest pain, shortness of breath. There is concern that she might have Sjogren's syndrome.   Review of Systems     Objective:   Physical Exam Alert and in no distress. The eyes appear to have adequate lacrimation with no inflammation. Tympanic membranes and canals are normal. Pharyngeal area is normal. Neck is supple without adenopathy or thyromegaly. Cardiac exam shows a regular sinus rhythm without murmurs or gallops. Lungs are clear to auscultation. Joints show no effusions.       Assessment & Plan:  Dry eyes - Plan: ANA,IFA Sjogrens' Pnl rflx Tit/Patn, Comprehensive metabolic panel  Arthritis - Plan: ANA,IFA Sjogrens' Pnl rflx Tit/Patn, Comprehensive metabolic panel I will check for Sjogren's syndrome but clinically had this point I do not think we have much to worry about.

## 2016-11-23 LAB — ANA,IFA SJOGRENS' PNL RFLX TIT/PATN
ANA: NEGATIVE
Rhuematoid fact SerPl-aCnc: <14 IU/mL (ref <14)
SSA (RO) (ENA) ANTIBODY, IGG: NEGATIVE
SSB (LA) (ENA) ANTIBODY, IGG: NEGATIVE

## 2016-11-29 ENCOUNTER — Other Ambulatory Visit: Payer: Self-pay | Admitting: Neurology

## 2016-11-29 ENCOUNTER — Telehealth: Payer: Self-pay | Admitting: Neurology

## 2016-11-29 DIAGNOSIS — F5102 Adjustment insomnia: Secondary | ICD-10-CM

## 2016-11-29 MED ORDER — AMPHETAMINE-DEXTROAMPHET ER 15 MG PO CP24: 15.0000 mg | ORAL_CAPSULE | Freq: Every day | ORAL | 0 refills | Status: DC

## 2016-11-29 NOTE — Telephone Encounter (Signed)
Medication refilled will be ready for pick up at front desk after lunch

## 2016-11-29 NOTE — Telephone Encounter (Signed)
Pt calling for refill of amphetamine-dextroamphetamine (ADDERALL XR) 15 MG 24 hr capsule

## 2016-11-30 ENCOUNTER — Ambulatory Visit (INDEPENDENT_AMBULATORY_CARE_PROVIDER_SITE_OTHER): Payer: BLUE CROSS/BLUE SHIELD | Admitting: Neurology

## 2016-11-30 ENCOUNTER — Encounter: Payer: Self-pay | Admitting: Neurology

## 2016-11-30 VITALS — BP 132/83 | HR 76 | Ht <= 58 in | Wt 120.0 lb

## 2016-11-30 DIAGNOSIS — F5102 Adjustment insomnia: Secondary | ICD-10-CM

## 2016-11-30 DIAGNOSIS — H16223 Keratoconjunctivitis sicca, not specified as Sjogren's, bilateral: Secondary | ICD-10-CM | POA: Diagnosis not present

## 2016-11-30 DIAGNOSIS — F5105 Insomnia due to other mental disorder: Secondary | ICD-10-CM

## 2016-11-30 DIAGNOSIS — M1990 Unspecified osteoarthritis, unspecified site: Secondary | ICD-10-CM | POA: Diagnosis not present

## 2016-11-30 MED ORDER — QUETIAPINE FUMARATE 25 MG PO TABS: 25.0000 mg | ORAL_TABLET | Freq: Every day | ORAL | 3 refills | Status: DC

## 2016-11-30 MED ORDER — AMPHETAMINE-DEXTROAMPHET ER 15 MG PO CP24: 15.0000 mg | ORAL_CAPSULE | Freq: Every day | ORAL | 0 refills | Status: DC

## 2016-11-30 NOTE — Patient Instructions (Signed)
Sleep before midnight is important

## 2016-11-30 NOTE — Progress Notes (Signed)
PATIENT: Susan Fisher DOB: 12/09/1965  REASON FOR VISIT: follow up- excessive daytime sleepiness, insomnia HISTORY FROM: patient  HISTORY OF PRESENT ILLNESS:     HISTORY 11/26/14: Ms. Susan Fisher is a 51 year old female with a history of insomnia and excessive daytime sleepiness. She returns today for follow-up. At the last visit the patient's Adderall was increased to 15mg  she states that this has been working well. She continues to take Seroquel at bedtime. She states in the last couple weeks she's been under additional stress due to starting school. She is enrolled at [facility] for the physical therapy program. She feels that her stress has caused her insomnia to worsen. She states that she usually tries to go to bed between 10 and 11 PM and arises at 6:30 AM. She denies any new symptoms. She returns today for an evaluation.   MM - 05/2015 Susan Fisher is a 51 year old female with a history of excessive daytime sleepiness and insomnia. She returns today for an evaluation. The patient continues to take Adderall 15 mg daily. She feels that this is working well for her. She states that if she was not taking this medication she feels that she would be a "zombie." She feels that this is partly due to her being in school for physical therapy. She states that the program is very rigorous and takes up most of her time. She continues using Seroquel at night. She states that this works well unless someone spends the night at her house. She states that they get up during the night to use the restroom and she hears them she will be unable to fall back asleep. She denies any new neurological symptoms. She returns today for an evaluation.  I have the pleasure of seeing Susan Fisher today on 11/26/2015 in a routine revisit. The patient has chronic insomnia which contains both elements the inability to initiate sleep at times and the inability to maintain or sustain sleep. She has been doing well with using Seroquel she does  need to take Adderall in daytime also it may interfere his nocturnal sleep for excessive daytime sleepiness.It helped her to study as well to focus and concentrate and not be distracted. 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  Interval history from 11/30/2016. Susan Fisher is seen here today, and today presents with a normal question. 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 She has more joint pain and wondered if this is related or if it could be part of a systemic problem. She has seen her ophthalmologist multiple times, was diagnosed as having a corneal abrasion, now her physician feels that it could be also dry eye causing her symptoms. So a systemic rheumatological or autoimmune disease could be part of the problem, I treat this patient for ADD, insomnia, but  joint pain and neck pain have not been part of this sleep related complex. She also takes Adderall and daytime which could give her a dry mouth but usually does not cause dry eyes. Dr Jill Alexanders, her primary care physician, tested her for several rheumatological disorders and the laboratory results are available in Epic. ANA,IFA Sjogrens' Pnl rflx Tit/Patn  Order: [id number]  Status:  Final result Visible to patient:  Yes (MyChart) Next appt:  None Dx:  Dry eyes; Arthritis   Ref Range & Units 8d ago  Rhuematoid fact SerPl-aCnc <14 IU/mL <14  Anit Nuclear Antibody(ANA) NEGATIVE NEG   SSA (Ro) (ENA) Antibody, IgG <1.0 NEG AI <1.0 NEG   SSB (La) (ENA) Antibody, IgG <1.0 NEG AI <1.0 NEG   Resulting Agency  [facility]  Narrative   Performed at: [address], [address]        [place], [place] [address]    Specimen Collected: 11/22/16 14:08 Last Resulted: 11/23/16 14:20                  Other Results from 11/22/2016   Comprehensive metabolic panel  Order:  [id number]   Status:  Edited Result - FINAL Visible to patient:  Yes (MyChart) Next appt:  None Dx:  Dry eyes; Arthritis    Ref Range & Units 8d ago (11/22/16) 80yr ago (11/08/14) 21yr ago (11/08/14)   Sodium 135 - 146 mmol/L 137   141R    Potassium 3.5 - 5.3 mmol/L 4.4   4.6R    Chloride 98 - 110 mmol/L 103   107R    CO2 20 - 31 mmol/L 22   26R    Glucose, Bld 65 - 99 mg/dL 76   74R    BUN 7 - 25 mg/dL 18   12R    Creat 0.50 - 1.05 mg/dL 0.76   0.82R   Comment:   For patients > or = 50 years of age: The upper reference limit for  Creatinine is approximately 13% higher for people identified as  African-American.          REVIEW OF SYSTEMS: Out of a complete 14 system review of symptoms, the patient complains only of the following symptoms, and all other reviewed systems are negative.  Insomnia, daytime sleepiness, Epworth sleepiness score 10 and fatigue severity score 46  Joint pain, not myalgia, not scleroderma, no dysphagia, no HTN, mouth is not dry, eyes are dry.   ALLERGIES: Allergies  Allergen Reactions  . Gluten Meal Other (See Comments)    Gluten intolerant  . Venlafaxine     ineffective    HOME MEDICATIONS: Outpatient Medications Prior to Visit  Medication Sig Dispense Refill  . amphetamine-dextroamphetamine (ADDERALL XR) 15 MG 24 hr capsule Take 1 capsule by mouth daily. 30 capsule 0  . ketotifen (ALAWAY) 0.025 % ophthalmic solution Place 1 drop into both eyes 2 (two) times daily as needed (dry eyes).     . meloxicam (MOBIC) 15 MG tablet Take 1 tablet (15 mg total) by mouth daily as needed for pain. 30 tablet 1  . Polyethyl Glycol-Propyl Glycol (SYSTANE OP) Place 1 drop into both eyes 2 (two) times daily.    . QUEtiapine (SEROQUEL) 25 MG tablet Take 1 tablet (25 mg total) by mouth at bedtime. 90 tablet 3  . tiZANidine (ZANAFLEX) 4 MG tablet Take 2-4 mg by mouth 3 (three) times daily as needed for spasms.  0  . valACYclovir (VALTREX) 1000 MG tablet Take 2 tablets  (2,000 mg total) by mouth 2 (two) times daily. X 1 day as needed for fever blister outbreak 4 tablet 1   No facility-administered medications prior to visit.     PAST MEDICAL HISTORY: Past Medical History:  Diagnosis Date  . Anxiety   . Chronic insomnia   . Depression    history of  . Gluten intolerance 2011   Dr Marquette Saa  . Pneumonia    childhood  . Seasonal allergic rhinitis   . Sleep disorder    Dr Beacher May,  chronic and severe insomnia  . Torn ACL (anterior cruciate ligament)    left    PAST SURGICAL HISTORY: Past Surgical History:  Procedure Laterality Date  . ANTERIOR CRUCIATE LIGAMENT REPAIR Right 08/2013   [name]  . DILATATION & CURETTAGE/HYSTEROSCOPY WITH MYOSURE N/A 04/22/2016   Procedure: [place];  Surgeon: Servando Salina, MD;  Location: [facility];  Service: Gynecology;  Laterality: N/A;  . DILATATION & CURETTAGE/HYSTEROSCOPY WITH MYOSURE N/A 08/31/2016   Procedure: [place];  Surgeon: Servando Salina, MD;  Location: [facility];  Service: Gynecology;  Laterality: N/A;  . KNEE ARTHROSCOPY Left 1995  . UPPER GASTROINTESTINAL ENDOSCOPY  2008   neg; Dr Watt Climes  . UPPER GASTROINTESTINAL ENDOSCOPY  2009    [facility]    FAMILY HISTORY: Family History  Problem Relation Age of Onset  . Anxiety disorder Mother   . Depression Mother   . Hypertension Mother   . Stroke Mother 48  . Heart failure Mother   . Heart attack Mother 56  . Breast cancer Mother 29  . COPD Mother        smoker  . Pulmonary embolism Father 52       post rotator cuff surgery  . Heart attack Paternal Grandfather 26  . Tremor Sister   . Tremor Sister     SOCIAL HISTORY: Social History   Social History  . Marital status: Single    Spouse name: N/A  . Number of children: 0  . Years of education: College   Occupational History  .  [employer]   Social History Main Topics  . Smoking status: Never  Smoker  . Smokeless tobacco: Never Used  . Alcohol use 4.2 oz/week    7 Glasses of wine per week     Comment:  socially  . Drug use: No  . Sexual activity: Not on file   Other Topics Concern  . Not on file   Social History Narrative   Patient is single and lives alone.     Patient has a college education.   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 -    planning to enroll with PT asst school at [employer] fall 2016   Patient drinks two caffeine drinks daily.   Patient is right-handed.         PHYSICAL EXAM  Vitals:   11/30/16 1424  BP: 132/83  Pulse: 76  Weight: 120 lb (54.4 kg)  Height: 4\' 10"  (1.473 m)   Body mass index is 25.08 kg/m.  Generalized: Well developed, in no acute distress   Neurological examination  Mentation: Alert oriented to time, place, history taking. Follows all commands speech and language fluent Cranial nerve ; no change in taste or smell.  Pupils were equal round reactive to light.  Extraocular movements were full, visual field were full on confrontational test. Facial sensation and strength were normal. Uvula tongue midline. Head turning and shoulder shrug  were normal and symmetric. Motor: The motor testing reveals 5 over 5 strength of all 4 extremities. Good symmetric motor tone is noted throughout.  Sensory: on 4 extremities, no evidence of extinction is noted.  Coordination: bilateral finger-nose-finger . Gait and station: Gait is normal. Tandem gait is normal. Romberg is negative. No drift is seen.  Reflexes: Deep tendon reflexes are symmetric and normal bilaterally.   DIAGNOSTIC DATA (LABS, IMAGING, TESTING) - I reviewed patient records, labs, notes, testing and imaging myself where available.  Lab Results  Component Value Date   WBC 13.3 (H) 08/31/2016   HGB 15.8 (H) 08/31/2016   HCT 47.5 (H) 08/31/2016   MCV 92.1 08/31/2016   PLT 259 08/31/2016      Component Value Date/Time   NA 137 11/22/2016 1408   K 4.4 11/22/2016 1408    CL 103 11/22/2016 1408   CO2 22 11/22/2016 1408   GLUCOSE 76 11/22/2016 1408   BUN 18 11/22/2016 1408   CREATININE 0.76 11/22/2016 1408   CALCIUM 9.3 11/22/2016 1408   PROT 6.4 11/22/2016 1408   ALBUMIN 4.3 11/22/2016 1408   AST 19 11/22/2016 1408   ALT 12 11/22/2016 1408   ALKPHOS 50 11/22/2016 1408   BILITOT 0.6 11/22/2016 1408   GFRNONAA 86 (L) 12/14/2012 0250   GFRAA >90 12/14/2012 0250   Lab Results  Component Value Date   CHOL 159 11/08/2014   HDL 72.90 11/08/2014   LDLCALC 73 11/08/2014   TRIG 64.0 11/08/2014   CHOLHDL 2 11/08/2014    Lab Results  Component Value Date   TSH 2.70 11/08/2014      ASSESSMENT AND PLAN 51 y.o. year old female  has a past medical history of Anxiety; Chronic insomnia; Depression; Gluten intolerance (2011); Pneumonia; Seasonal allergic rhinitis; Sleep disorder; and Torn ACL (anterior cruciate ligament). here with:  1. Insomnia, She is getting on average 6 hours of nocturnal sleep on Seroquel . There are difficulties to fall asleep and stay asleep recorded. Stress is a component . 2. Excessive daytime sleepiness, partially due to comittment , stress in school [facility]- PT assistant ). Controlled sleepiness and ADD.  Overall the patient is doing well. She will continue on Adderall and seroquel.  I will refill these medications today. I recommned a book for her , WHY WE SLEEP, Chiquita Loth, PHD>   The patient advised that if her symptoms worsen or she develops any new symptoms she should let us know. She will follow-up in 12 months with NP.   Larey Seat, MD  11/30/2016, 2:35 PM   Medical director of [facility] at  [facility] [address] [place], [place] [address] [phone]

## 2016-12-13 DIAGNOSIS — H16223 Keratoconjunctivitis sicca, not specified as Sjogren's, bilateral: Secondary | ICD-10-CM | POA: Diagnosis not present

## 2016-12-13 DIAGNOSIS — H109 Unspecified conjunctivitis: Secondary | ICD-10-CM | POA: Diagnosis not present

## 2017-01-10 ENCOUNTER — Other Ambulatory Visit (INDEPENDENT_AMBULATORY_CARE_PROVIDER_SITE_OTHER): Payer: BLUE CROSS/BLUE SHIELD

## 2017-01-10 DIAGNOSIS — Z111 Encounter for screening for respiratory tuberculosis: Secondary | ICD-10-CM

## 2017-01-12 LAB — TB SKIN TEST
INDURATION: 0 mm
TB Skin Test: NEGATIVE

## 2017-01-28 ENCOUNTER — Telehealth: Payer: Self-pay | Admitting: Neurology

## 2017-01-28 ENCOUNTER — Other Ambulatory Visit: Payer: Self-pay | Admitting: Neurology

## 2017-01-28 DIAGNOSIS — F5102 Adjustment insomnia: Secondary | ICD-10-CM

## 2017-01-28 MED ORDER — AMPHETAMINE-DEXTROAMPHET ER 15 MG PO CP24: 15.0000 mg | ORAL_CAPSULE | Freq: Every day | ORAL | 0 refills | Status: DC

## 2017-01-28 NOTE — Telephone Encounter (Signed)
Patient called office requesting refill for amphetamine-dextroamphetamine (ADDERALL XR) 15 MG 24 hr capsule

## 2017-01-28 NOTE — Telephone Encounter (Signed)
Script will be ready for pick up.

## 2017-02-22 ENCOUNTER — Ambulatory Visit (INDEPENDENT_AMBULATORY_CARE_PROVIDER_SITE_OTHER): Payer: BLUE CROSS/BLUE SHIELD | Admitting: Family Medicine

## 2017-02-22 ENCOUNTER — Encounter: Payer: Self-pay | Admitting: Family Medicine

## 2017-02-22 VITALS — BP 118/80 | HR 82 | Temp 98.4°F | Wt 120.6 lb

## 2017-02-22 DIAGNOSIS — J0110 Acute frontal sinusitis, unspecified: Secondary | ICD-10-CM | POA: Diagnosis not present

## 2017-02-22 DIAGNOSIS — J209 Acute bronchitis, unspecified: Secondary | ICD-10-CM | POA: Diagnosis not present

## 2017-02-22 DIAGNOSIS — H6123 Impacted cerumen, bilateral: Secondary | ICD-10-CM | POA: Diagnosis not present

## 2017-02-22 DIAGNOSIS — J302 Other seasonal allergic rhinitis: Secondary | ICD-10-CM

## 2017-02-22 MED ORDER — AMOXICILLIN 875 MG PO TABS: 875.0000 mg | ORAL_TABLET | Freq: Two times a day (BID) | ORAL | 0 refills | Status: DC

## 2017-02-22 MED ORDER — FLUCONAZOLE 150 MG PO TABS: 150.0000 mg | ORAL_TABLET | Freq: Once | ORAL | 0 refills | Status: AC

## 2017-02-22 NOTE — Progress Notes (Signed)
   Subjective:    Patient ID: Susan Fisher, female    DOB: October 26, 1965, 51 y.o.   MRN: [id number]  HPI She states that 2 weeks ago she developed fever, chills, sore throat with nasal congestion and rhinorrhea. Symptoms have continued and she now complains of some right ear discomfort and occasionally productive cough. She does not smoke. She does have a previous history of allergic rhinitis and now is taking OTC medications.   Review of Systems     Objective:   Physical Exam Alert and in no distress. Tympanic membranes and canals are normal after cerumen was lavaged and manually removed.. Nasal mucosa is slightly erythematous with no tenderness over sinuses Pharyngeal area is normal. Neck is supple without adenopathy or thyromegaly. Cardiac exam shows a regular sinus rhythm without murmurs or gallops. Lungs are clear to auscultation.        Assessment & Plan:  Bilateral impacted cerumen  Seasonal allergies  Acute frontal sinusitis, recurrence not specified - Plan: amoxicillin (AMOXIL) 875 MG tablet  Acute bronchitis, unspecified organism - Plan: amoxicillin (AMOXIL) 875 MG tablet She will call if not entirely better when she finishes the antibiotic.

## 2017-03-23 ENCOUNTER — Telehealth: Payer: Self-pay | Admitting: Neurology

## 2017-03-23 ENCOUNTER — Other Ambulatory Visit: Payer: Self-pay | Admitting: Neurology

## 2017-03-23 DIAGNOSIS — F5102 Adjustment insomnia: Secondary | ICD-10-CM

## 2017-03-23 MED ORDER — AMPHETAMINE-DEXTROAMPHET ER 15 MG PO CP24: 15.0000 mg | ORAL_CAPSULE | Freq: Every day | ORAL | 0 refills | Status: DC

## 2017-03-23 NOTE — Telephone Encounter (Signed)
Script will be ready for pick up this afternoon at the front desk for the patient

## 2017-03-23 NOTE — Telephone Encounter (Signed)
Patient requesting refill of amphetamine-dextroamphetamine (ADDERALL XR) 15 MG 24 hr capsule.

## 2017-05-11 ENCOUNTER — Other Ambulatory Visit: Payer: BLUE CROSS/BLUE SHIELD

## 2017-06-01 ENCOUNTER — Encounter: Payer: Self-pay | Admitting: Family Medicine

## 2017-06-01 ENCOUNTER — Other Ambulatory Visit: Payer: Self-pay | Admitting: Obstetrics and Gynecology

## 2017-06-01 MED ORDER — VALACYCLOVIR HCL 1 G PO TABS: 2000.0000 mg | ORAL_TABLET | Freq: Two times a day (BID) | ORAL | 2 refills | Status: DC

## 2017-06-02 DIAGNOSIS — Z1159 Encounter for screening for other viral diseases: Secondary | ICD-10-CM | POA: Diagnosis not present

## 2017-06-02 DIAGNOSIS — N951 Menopausal and female climacteric states: Secondary | ICD-10-CM | POA: Diagnosis not present

## 2017-06-02 DIAGNOSIS — Z01419 Encounter for gynecological examination (general) (routine) without abnormal findings: Secondary | ICD-10-CM | POA: Diagnosis not present

## 2017-06-02 DIAGNOSIS — Z6824 Body mass index (BMI) 24.0-24.9, adult: Secondary | ICD-10-CM | POA: Diagnosis not present

## 2017-06-02 DIAGNOSIS — Z1231 Encounter for screening mammogram for malignant neoplasm of breast: Secondary | ICD-10-CM | POA: Diagnosis not present

## 2017-06-02 LAB — HM MAMMOGRAPHY

## 2017-06-14 DIAGNOSIS — R635 Abnormal weight gain: Secondary | ICD-10-CM | POA: Diagnosis not present

## 2017-06-14 DIAGNOSIS — N951 Menopausal and female climacteric states: Secondary | ICD-10-CM | POA: Diagnosis not present

## 2017-06-15 DIAGNOSIS — H18832 Recurrent erosion of cornea, left eye: Secondary | ICD-10-CM | POA: Diagnosis not present

## 2017-06-15 DIAGNOSIS — H16223 Keratoconjunctivitis sicca, not specified as Sjogren's, bilateral: Secondary | ICD-10-CM | POA: Diagnosis not present

## 2017-06-16 DIAGNOSIS — G479 Sleep disorder, unspecified: Secondary | ICD-10-CM | POA: Diagnosis not present

## 2017-06-16 DIAGNOSIS — Z1331 Encounter for screening for depression: Secondary | ICD-10-CM | POA: Diagnosis not present

## 2017-06-16 DIAGNOSIS — R5383 Other fatigue: Secondary | ICD-10-CM | POA: Diagnosis not present

## 2017-06-16 DIAGNOSIS — Z87898 Personal history of other specified conditions: Secondary | ICD-10-CM | POA: Diagnosis not present

## 2017-06-16 DIAGNOSIS — M2550 Pain in unspecified joint: Secondary | ICD-10-CM | POA: Diagnosis not present

## 2017-06-16 DIAGNOSIS — Z1339 Encounter for screening examination for other mental health and behavioral disorders: Secondary | ICD-10-CM | POA: Diagnosis not present

## 2017-06-17 DIAGNOSIS — D229 Melanocytic nevi, unspecified: Secondary | ICD-10-CM | POA: Diagnosis not present

## 2017-06-17 DIAGNOSIS — B070 Plantar wart: Secondary | ICD-10-CM | POA: Diagnosis not present

## 2017-06-21 ENCOUNTER — Telehealth: Payer: Self-pay | Admitting: Neurology

## 2017-06-21 NOTE — Telephone Encounter (Signed)
Pt calling for prescription refill amphetamine-dextroamphetamine (ADDERALL XR) 15 MG 24 hr capsule

## 2017-06-22 ENCOUNTER — Other Ambulatory Visit: Payer: Self-pay | Admitting: Neurology

## 2017-06-22 DIAGNOSIS — F5102 Adjustment insomnia: Secondary | ICD-10-CM

## 2017-06-22 MED ORDER — AMPHETAMINE-DEXTROAMPHET ER 15 MG PO CP24: 15.0000 mg | ORAL_CAPSULE | Freq: Every day | ORAL | 0 refills | Status: DC

## 2017-06-22 NOTE — Telephone Encounter (Signed)
Prescription will be ready for pick up after lunch today.

## 2017-07-14 DIAGNOSIS — G479 Sleep disorder, unspecified: Secondary | ICD-10-CM | POA: Diagnosis not present

## 2017-07-14 DIAGNOSIS — M2550 Pain in unspecified joint: Secondary | ICD-10-CM | POA: Diagnosis not present

## 2017-07-14 DIAGNOSIS — R5383 Other fatigue: Secondary | ICD-10-CM | POA: Diagnosis not present

## 2017-07-15 DIAGNOSIS — R5383 Other fatigue: Secondary | ICD-10-CM | POA: Diagnosis not present

## 2017-07-15 DIAGNOSIS — R4586 Emotional lability: Secondary | ICD-10-CM | POA: Diagnosis not present

## 2017-07-15 DIAGNOSIS — G479 Sleep disorder, unspecified: Secondary | ICD-10-CM | POA: Diagnosis not present

## 2017-07-18 DIAGNOSIS — H18832 Recurrent erosion of cornea, left eye: Secondary | ICD-10-CM | POA: Diagnosis not present

## 2017-07-18 DIAGNOSIS — H40053 Ocular hypertension, bilateral: Secondary | ICD-10-CM | POA: Diagnosis not present

## 2017-07-18 DIAGNOSIS — H16223 Keratoconjunctivitis sicca, not specified as Sjogren's, bilateral: Secondary | ICD-10-CM | POA: Diagnosis not present

## 2017-07-20 ENCOUNTER — Encounter: Payer: Self-pay | Admitting: Family Medicine

## 2017-07-20 ENCOUNTER — Ambulatory Visit (INDEPENDENT_AMBULATORY_CARE_PROVIDER_SITE_OTHER): Payer: BLUE CROSS/BLUE SHIELD | Admitting: Family Medicine

## 2017-07-20 VITALS — BP 136/80 | HR 64 | Temp 97.5°F | Ht 59.0 in | Wt 116.4 lb

## 2017-07-20 DIAGNOSIS — J069 Acute upper respiratory infection, unspecified: Secondary | ICD-10-CM

## 2017-07-20 MED ORDER — AMOXICILLIN 500 MG PO TABS: 1000.0000 mg | ORAL_TABLET | Freq: Two times a day (BID) | ORAL | 0 refills | Status: DC

## 2017-07-20 NOTE — Patient Instructions (Signed)
  Drink plenty of water. Be sure to look at all labels of combination medications to ensure that you aren't getting duplicates (acetaminophen, phenylephrine, dextromethorphan).  Ingredients that will help: Phenylephrine or pseudoephedrine (decongestants, to dry up the drainage, open the sinus passages and help with sinus pain). Guafenesin (expectorant to loosen up the mucus/phlegm--found in Mucinex/Robitussin) Dextromethorphan--cough suppressant (DM) Tylenol/acetaminophen as needed for pain or fever. Be careful if taking advil not to take your other anti-inflammatory Use caution with decongestants and your adderall (can both cause jitteriness, increased pulse, blood pressure and insomnia).  Consider doing sinus rinses, as we discussed. Consider using Afrin spray (vs sudafed or other decongestant) prior to your flight to help prevent ear pain.  Start the antibiotics only if you develop discolored mucus (yellow-green), worsening sinus pain, recurrent fever.

## 2017-07-20 NOTE — Progress Notes (Signed)
Chief Complaint  Patient presents with  . Cough    started last Wed, lots of drainage and ears hurt. Thinks she has had fever as well. Mucus is white in color. Leaving for a cruise this Sat and wants to be well.    Last week she started with sneezing, the next day felt feverish, had a cough, runny nose, watery eyes.  She has felt hot/cold over the week.  Last night her right ear started feeling clogged up. +headaches across her forehead.  Nasal drainage is clear, slightly cloudy.  Phlegm is white. Denies any chest pain, shortness of breath.  +sick contacts last week. She has used Theraflu, Mucinex (daytime/nighttime), Advil cold and sinus   PMH, PSH, SH reviewed  Outpatient Encounter Medications as of 07/20/2017  Medication Sig  . amphetamine-dextroamphetamine (ADDERALL XR) 15 MG 24 hr capsule Take 1 capsule by mouth daily.  . diclofenac (VOLTAREN) 75 MG EC tablet   . Polyethyl Glycol-Propyl Glycol (SYSTANE OP) Place 1 drop into both eyes 2 (two) times daily.  . QUEtiapine (SEROQUEL) 25 MG tablet Take 1 tablet (25 mg total) by mouth at bedtime.  . valACYclovir (VALTREX) 1000 MG tablet Take 2 tablets (2,000 mg total) by mouth 2 (two) times daily. X 1 day as needed for fever blister outbreak  . ketotifen (ALAWAY) 0.025 % ophthalmic solution Place 1 drop into both eyes 2 (two) times daily as needed (dry eyes).   Marland Kitchen tiZANidine (ZANAFLEX) 4 MG tablet Take 2-4 mg by mouth 3 (three) times daily as needed for spasms.  . [DISCONTINUED] amoxicillin (AMOXIL) 875 MG tablet Take 1 tablet (875 mg total) by mouth 2 (two) times daily.   No facility-administered encounter medications on file as of 07/20/2017.    Allergies  Allergen Reactions  . Gluten Meal Other (See Comments)    Gluten intolerant  . Venlafaxine     ineffective   ROS: Slight diarrhea, no nausea, vomiting, urinary complaints, myalgias. URi symptoms per HPI.  No chest pain, shortness of breath, bleeding, bruising, rash.  PHYSICAL  EXAM:  BP 136/80   Pulse 64   Temp (!) 97.5 F (36.4 C) (Tympanic)   Ht 4\' 11"  (1.499 m)   Wt 116 lb 6.4 oz (52.8 kg)   BMI 23.51 kg/m   Well-appearing, congested-sounding female, in no distress. No sniffing or coughing during visit. HEENT: PERRL, EOMI, conjunctiva and sclera are clear. Nasal mucosa with mod edema, no erythema or purulence. Sinuses are nontender TM's and EAC's are normal (inferior portion of right side not visualized due to cerumen but rest is normal.  OP is clear Neck:no lymphadenopathy or mass Heart: regular rate and rhythm Lungs clear bilaterally, no wheezes, rales, ronchi Skin: normal turgor, no rash Neuro: alert and oriented, cranial nerves intact, normal gait Psych: normal mood, affect, hygiene and grooming  ASSESSMENT/PLAN:  Upper respiratory tract infection, unspecified type - suspect viral; reviewed s/sx bacterial infection and ABX given to take with her on cruise just in case they develop. Supportive measures reviewed - Plan: amoxicillin (AMOXIL) 500 MG tablet   Not given z-pak due to potential Q-T prolongation with seroquel. Given amox instead.   Drink plenty of water. Be sure to look at all labels of combination medications to ensure that you aren't getting duplicates (acetaminophen, phenylephrine, dextromethorphan).  Ingredients that will help: Phenylephrine or pseudoephedrine (decongestants, to dry up the drainage, open the sinus passages and help with sinus pain). Guafenesin (expectorant to loosen up the mucus/phlegm--found in Mucinex/Robitussin) Dextromethorphan--cough suppressant (DM)  Tylenol/acetaminophen as needed for pain or fever. Be careful if taking advil not to take your other anti-inflammatory Use caution with decongestants and your adderall (can both cause jitteriness, increased pulse, blood pressure and insomnia).  Consider doing sinus rinses, as we discussed. Consider using Afrin spray (vs sudafed or other decongestant) prior to  your flight to help prevent ear pain.  Start the antibiotics only if you develop discolored mucus (yellow-green), worsening sinus pain, recurrent fever.

## 2017-08-29 DIAGNOSIS — H18832 Recurrent erosion of cornea, left eye: Secondary | ICD-10-CM | POA: Diagnosis not present

## 2017-08-29 DIAGNOSIS — H16223 Keratoconjunctivitis sicca, not specified as Sjogren's, bilateral: Secondary | ICD-10-CM | POA: Diagnosis not present

## 2017-08-29 DIAGNOSIS — H40053 Ocular hypertension, bilateral: Secondary | ICD-10-CM | POA: Diagnosis not present

## 2017-08-30 ENCOUNTER — Telehealth: Payer: Self-pay | Admitting: Neurology

## 2017-08-30 ENCOUNTER — Other Ambulatory Visit: Payer: Self-pay | Admitting: Neurology

## 2017-08-30 DIAGNOSIS — F5102 Adjustment insomnia: Secondary | ICD-10-CM

## 2017-08-30 MED ORDER — AMPHETAMINE-DEXTROAMPHET ER 15 MG PO CP24: 15.0000 mg | ORAL_CAPSULE | Freq: Every day | ORAL | 0 refills | Status: DC

## 2017-08-30 NOTE — Telephone Encounter (Signed)
rx sent to the pharmacy. 

## 2017-08-30 NOTE — Telephone Encounter (Signed)
Pt request refill for amphetamine-dextroamphetamine (ADDERALL XR) 15 MG 24 hr capsule sent to [facility]

## 2017-08-30 NOTE — Telephone Encounter (Signed)
I have routed to the work in MD to fill since Dr Brett Fairy is out of the office today. Once MD reviews will send to pharmacy.

## 2017-09-07 DIAGNOSIS — F4321 Adjustment disorder with depressed mood: Secondary | ICD-10-CM | POA: Diagnosis not present

## 2017-09-14 DIAGNOSIS — F4321 Adjustment disorder with depressed mood: Secondary | ICD-10-CM | POA: Diagnosis not present

## 2017-09-20 ENCOUNTER — Encounter: Payer: Self-pay | Admitting: Family Medicine

## 2017-09-21 DIAGNOSIS — F4321 Adjustment disorder with depressed mood: Secondary | ICD-10-CM | POA: Diagnosis not present

## 2017-09-21 MED ORDER — ALPRAZOLAM 0.25 MG PO TABS: 0.2500 mg | ORAL_TABLET | Freq: Two times a day (BID) | ORAL | 0 refills | Status: DC | PRN

## 2017-09-21 NOTE — Telephone Encounter (Signed)
She recently broke up with her girlfriend and now has a new job.  Both of this has her quite anxious.  I will give her some Xanax to help with this.

## 2017-10-12 DIAGNOSIS — F4321 Adjustment disorder with depressed mood: Secondary | ICD-10-CM | POA: Diagnosis not present

## 2017-10-19 DIAGNOSIS — R5383 Other fatigue: Secondary | ICD-10-CM | POA: Diagnosis not present

## 2017-10-19 DIAGNOSIS — G479 Sleep disorder, unspecified: Secondary | ICD-10-CM | POA: Diagnosis not present

## 2017-10-19 DIAGNOSIS — R4586 Emotional lability: Secondary | ICD-10-CM | POA: Diagnosis not present

## 2017-10-24 DIAGNOSIS — G479 Sleep disorder, unspecified: Secondary | ICD-10-CM | POA: Diagnosis not present

## 2017-10-24 DIAGNOSIS — R4586 Emotional lability: Secondary | ICD-10-CM | POA: Diagnosis not present

## 2017-10-24 DIAGNOSIS — R5383 Other fatigue: Secondary | ICD-10-CM | POA: Diagnosis not present

## 2017-10-24 DIAGNOSIS — N951 Menopausal and female climacteric states: Secondary | ICD-10-CM | POA: Diagnosis not present

## 2017-10-26 DIAGNOSIS — F4321 Adjustment disorder with depressed mood: Secondary | ICD-10-CM | POA: Diagnosis not present

## 2017-11-09 DIAGNOSIS — F4321 Adjustment disorder with depressed mood: Secondary | ICD-10-CM | POA: Diagnosis not present

## 2017-11-22 ENCOUNTER — Telehealth: Payer: Self-pay | Admitting: Neurology

## 2017-11-22 ENCOUNTER — Other Ambulatory Visit: Payer: Self-pay | Admitting: Neurology

## 2017-11-22 DIAGNOSIS — F5102 Adjustment insomnia: Secondary | ICD-10-CM

## 2017-11-22 MED ORDER — AMPHETAMINE-DEXTROAMPHET ER 15 MG PO CP24: 15.0000 mg | ORAL_CAPSULE | Freq: Every day | ORAL | 0 refills | Status: DC

## 2017-11-22 NOTE — Telephone Encounter (Signed)
I have routed this request to Dr Dohmeier for review. The pt is due for the medication and [place] registry was verified.  

## 2017-11-22 NOTE — Telephone Encounter (Signed)
Pt requesting a refill for amphetamine-dextroamphetamine (ADDERALL XR) 15 MG 24 hr capsule sent to [facility]

## 2017-11-23 DIAGNOSIS — F4321 Adjustment disorder with depressed mood: Secondary | ICD-10-CM | POA: Diagnosis not present

## 2017-12-01 ENCOUNTER — Encounter: Payer: Self-pay | Admitting: Adult Health

## 2017-12-01 ENCOUNTER — Ambulatory Visit (INDEPENDENT_AMBULATORY_CARE_PROVIDER_SITE_OTHER): Payer: BLUE CROSS/BLUE SHIELD | Admitting: Adult Health

## 2017-12-01 VITALS — BP 123/76 | HR 81 | Ht 59.0 in | Wt 117.4 lb

## 2017-12-01 DIAGNOSIS — F988 Other specified behavioral and emotional disorders with onset usually occurring in childhood and adolescence: Secondary | ICD-10-CM | POA: Diagnosis not present

## 2017-12-01 DIAGNOSIS — F5102 Adjustment insomnia: Secondary | ICD-10-CM | POA: Diagnosis not present

## 2017-12-01 NOTE — Patient Instructions (Signed)
Your Plan:  Can try increasing Seroquel to 1.5 tablets at bedtime. If this is helpful and you want to continue please let us know and I will send in new prescription Continue Adderall If your symptoms worsen or you develop new symptoms please let us know.    Thank you for coming to see [place] at [facility]. I hope we have been able to provide you high quality care today.  You may receive a patient satisfaction survey over the next few weeks. We would appreciate your feedback and comments so that we may continue to improve ourselves and the health of our patients.

## 2017-12-01 NOTE — Progress Notes (Signed)
PATIENT: Susan Fisher DOB: Mar 06, 1966  REASON FOR VISIT: follow up HISTORY FROM: patient  HISTORY OF PRESENT ILLNESS: Today 12/01/17:  Susan Fisher is a 52 year old female with a history of insomnia and ADD.  She returns today for follow-up.  She reports that Seroquel was working well for her initially.  She states that it is not as helpful now.  She states that she still having trouble sleeping.  She also reports that she has been going through a break-up the last 2 months and that has been stressful.  She states that she does have depression related to this but denies any suicidal thoughts.  She continues with Adderall during the day.  Reports that continues to work well for her.  She returns today for evaluation.   HISTORY 11/30/2016 Copied form Dr. Edwena Felty note. Susan Fisher is seen here today, and today presents with a normal question. 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 She has more joint pain and wondered if this is related or if it could be part of a systemic problem. She has seen her ophthalmologist multiple times, was diagnosed as having a corneal abrasion, now her physician feels that it could be also dry eye causing her symptoms. So a systemic rheumatological or autoimmune disease could be part of the problem, I treat this patient for ADD, insomnia, but  joint pain and neck pain have not been part of this sleep related complex. She also takes Adderall and daytime which could give her a dry mouth but usually does not cause dry eyes. Dr Jill Alexanders, her primary care physician, tested her for several rheumatological disorders and the laboratory results are available in Epic.  REVIEW OF SYSTEMS: Out of a complete 14 system review of symptoms, the patient complains only of the following symptoms, and all other reviewed systems are negative.  See HPI  ALLERGIES: Allergies    Allergen Reactions  . Gluten Meal Other (See Comments)    Gluten intolerant  . Venlafaxine     ineffective    HOME MEDICATIONS: Outpatient Medications Prior to Visit  Medication Sig Dispense Refill  . ALPRAZolam (XANAX) 0.25 MG tablet Take 1 tablet (0.25 mg total) by mouth 2 (two) times daily as needed for anxiety. 20 tablet 0  . amphetamine-dextroamphetamine (ADDERALL XR) 15 MG 24 hr capsule Take 1 capsule by mouth daily. 30 capsule 0  . diclofenac (VOLTAREN) 75 MG EC tablet   0  . Polyethyl Glycol-Propyl Glycol (SYSTANE OP) Place 1 drop into both eyes 2 (two) times daily.    . QUEtiapine (SEROQUEL) 25 MG tablet Take 1 tablet (25 mg total) by mouth at bedtime. 90 tablet 3  . valACYclovir (VALTREX) 1000 MG tablet Take 2 tablets (2,000 mg total) by mouth 2 (two) times daily. X 1 day as needed for fever blister outbreak 8 tablet 2  . tiZANidine (ZANAFLEX) 4 MG tablet Take 2-4 mg by mouth 3 (three) times daily as needed for spasms.  0  . amoxicillin (AMOXIL) 500 MG tablet Take 2 tablets (1,000 mg total) by mouth 2 (two) times daily. 40 tablet 0  . ketotifen (ALAWAY) 0.025 % ophthalmic solution Place 1 drop into both eyes 2 (two) times daily as needed (dry eyes).      No facility-administered medications prior to visit.     PAST MEDICAL HISTORY: Past Medical History:  Diagnosis Date  . Anxiety   .  Chronic insomnia   . Depression    history of  . Gluten intolerance 2011   Dr Marquette Saa  . Pneumonia    childhood  . Seasonal allergic rhinitis   . Sleep disorder    Dr Beacher May, chronic and severe insomnia  . Torn ACL (anterior cruciate ligament)    left    PAST SURGICAL HISTORY: Past Surgical History:  Procedure Laterality Date  . ANTERIOR CRUCIATE LIGAMENT REPAIR Right 08/2013   [name]  . DILATATION & CURETTAGE/HYSTEROSCOPY WITH MYOSURE N/A 04/22/2016   Procedure: [place];  Surgeon: Servando Salina, MD;  Location: [facility];   Service: Gynecology;  Laterality: N/A;  . DILATATION & CURETTAGE/HYSTEROSCOPY WITH MYOSURE N/A 08/31/2016   Procedure: [place];  Surgeon: Servando Salina, MD;  Location: [facility];  Service: Gynecology;  Laterality: N/A;  . KNEE ARTHROSCOPY Left 1995  . UPPER GASTROINTESTINAL ENDOSCOPY  2008   neg; Dr Watt Climes  . UPPER GASTROINTESTINAL ENDOSCOPY  2009    [facility]    FAMILY HISTORY: Family History  Problem Relation Age of Onset  . Anxiety disorder Mother   . Depression Mother   . Hypertension Mother   . Stroke Mother 53  . Heart failure Mother   . Heart attack Mother 10  . Breast cancer Mother 55  . COPD Mother        smoker  . Pulmonary embolism Father 104       post rotator cuff surgery  . Heart attack Paternal Grandfather 77  . Tremor Sister   . Tremor Sister     SOCIAL HISTORY: Social History   Socioeconomic History  . Marital status: Single    Spouse name: Not on file  . Number of children: 0  . Years of education: College  . Highest education level: Not on file  Occupational History    Employer: [employer]  . Financial resource strain: Not on file  . Food insecurity:    Worry: Not on file    Inability: Not on file  . Transportation needs:    Medical: Not on file    Non-medical: Not on file  Tobacco Use  . Smoking status: Never Smoker  . Smokeless tobacco: Never Used  Substance and Sexual Activity  . Alcohol use: Yes    Alcohol/week: 7.0 standard drinks    Types: 7 Glasses of wine per week    Comment:  socially  . Drug use: No  . Sexual activity: Not on file  Lifestyle  . Physical activity:    Days per week: Not on file    Minutes per session: Not on file  . Stress: Not on file  Relationships  . Social connections:    Talks on phone: Not on file    Gets together: Not on file    Attends religious service: Not on file    Active member of club or organization: Not on file    Attends meetings of  clubs or organizations: Not on file    Relationship status: Not on file  . Intimate partner violence:    Fear of current or ex partner: Not on file    Emotionally abused: Not on file    Physically abused: Not on file    Forced sexual activity: Not on file  Other Topics Concern  . Not on file  Social History Narrative   Patient is single and lives alone.     Patient has a college  education.   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 -    planning to enroll with PT asst school at [employer] fall 2016   Patient drinks two caffeine drinks daily.   Patient is right-handed.         PHYSICAL EXAM  Vitals:   12/01/17 0752  BP: 123/76  Pulse: 81  Weight: 117 lb 6.4 oz (53.3 kg)  Height: 4\' 11"  (1.499 m)   Body mass index is 23.71 kg/m.  Generalized: Well developed, in no acute distress   Neurological examination  Mentation: Alert oriented to time, place, history taking. Follows all commands speech and language fluent Cranial nerve II-XII: Pupils were equal round reactive to light. Extraocular movements were full, visual field were full on confrontational test. Facial sensation and strength were normal. Uvula tongue midline. Head turning and shoulder shrug  were normal and symmetric. Motor: The motor testing reveals 5 over 5 strength of all 4 extremities. Good symmetric motor tone is noted throughout.  Sensory: Sensory testing is intact to soft touch on all 4 extremities. No evidence of extinction is noted.  Coordination: Cerebellar testing reveals good finger-nose-finger and heel-to-shin bilaterally.  Gait and station: Gait is normal. Tandem gait is normal. Romberg is negative. No drift is seen.  Reflexes: Deep tendon reflexes are symmetric and normal bilaterally.   DIAGNOSTIC DATA (LABS, IMAGING, TESTING) - I reviewed patient records, labs, notes, testing and imaging myself where available.  Lab Results  Component Value Date   WBC 13.3 (H) 08/31/2016   HGB 15.8 (H)  08/31/2016   HCT 47.5 (H) 08/31/2016   MCV 92.1 08/31/2016   PLT 259 08/31/2016      Component Value Date/Time   NA 137 11/22/2016 1408   K 4.4 11/22/2016 1408   CL 103 11/22/2016 1408   CO2 22 11/22/2016 1408   GLUCOSE 76 11/22/2016 1408   BUN 18 11/22/2016 1408   CREATININE 0.76 11/22/2016 1408   CALCIUM 9.3 11/22/2016 1408   PROT 6.4 11/22/2016 1408   ALBUMIN 4.3 11/22/2016 1408   AST 19 11/22/2016 1408   ALT 12 11/22/2016 1408   ALKPHOS 50 11/22/2016 1408   BILITOT 0.6 11/22/2016 1408   GFRNONAA 86 (L) 12/14/2012 0250   GFRAA >90 12/14/2012 0250   Lab Results  Component Value Date   CHOL 159 11/08/2014   HDL 72.90 11/08/2014   LDLCALC 73 11/08/2014   TRIG 64.0 11/08/2014   CHOLHDL 2 11/08/2014    Lab Results  Component Value Date   TSH 2.70 11/08/2014      ASSESSMENT AND PLAN 52 y.o. year old female  has a past medical history of Anxiety, Chronic insomnia, Depression, Gluten intolerance (2011), Pneumonia, Seasonal allergic rhinitis, Sleep disorder, and Torn ACL (anterior cruciate ligament). here with:  1.  Insomnia 2.  ADD  We discussed potentially increasing Seroquel.  She wants to try taking 1-1/2 tablets for 1 to 2 weeks to see if it is beneficial.  She asked that I not send in a new prescription.  If it is helpful she will let me know and we will change her prescription at that time. I reviewed potential side effects of Seroquel.  She will continue on Adderall during the day.  She is advised that if her symptoms worsen or she develops new symptoms she should let us know.     Ward Givens, MSN, NP-C 12/01/2017, 8:09 AM [facility] [address] [place], [place] [address] [phone]

## 2017-12-28 DIAGNOSIS — F4321 Adjustment disorder with depressed mood: Secondary | ICD-10-CM | POA: Diagnosis not present

## 2017-12-28 NOTE — Progress Notes (Signed)
I agree with the assessment and plan as directed by NP .The patient is known to me .   Dellie Piasecki, MD  

## 2018-01-03 ENCOUNTER — Other Ambulatory Visit: Payer: Self-pay | Admitting: Adult Health

## 2018-01-03 DIAGNOSIS — F5102 Adjustment insomnia: Secondary | ICD-10-CM

## 2018-01-03 NOTE — Telephone Encounter (Signed)
Pt requesting refills for amphetamine-dextroamphetamine (ADDERALL XR) 15 MG 24 hr capsule sent through [facility]

## 2018-01-04 MED ORDER — AMPHETAMINE-DEXTROAMPHET ER 15 MG PO CP24: 15.0000 mg | ORAL_CAPSULE | Freq: Every day | ORAL | 0 refills | Status: DC

## 2018-01-14 ENCOUNTER — Other Ambulatory Visit: Payer: Self-pay | Admitting: Neurology

## 2018-01-14 DIAGNOSIS — F5102 Adjustment insomnia: Secondary | ICD-10-CM

## 2018-01-14 DIAGNOSIS — F5105 Insomnia due to other mental disorder: Secondary | ICD-10-CM

## 2018-01-18 DIAGNOSIS — F4321 Adjustment disorder with depressed mood: Secondary | ICD-10-CM | POA: Diagnosis not present

## 2018-03-01 DIAGNOSIS — H16223 Keratoconjunctivitis sicca, not specified as Sjogren's, bilateral: Secondary | ICD-10-CM | POA: Diagnosis not present

## 2018-03-01 DIAGNOSIS — H18832 Recurrent erosion of cornea, left eye: Secondary | ICD-10-CM | POA: Diagnosis not present

## 2018-03-01 DIAGNOSIS — H40053 Ocular hypertension, bilateral: Secondary | ICD-10-CM | POA: Diagnosis not present

## 2018-03-03 DIAGNOSIS — Z114 Encounter for screening for human immunodeficiency virus [HIV]: Secondary | ICD-10-CM | POA: Diagnosis not present

## 2018-03-03 DIAGNOSIS — Z118 Encounter for screening for other infectious and parasitic diseases: Secondary | ICD-10-CM | POA: Diagnosis not present

## 2018-03-03 DIAGNOSIS — Z113 Encounter for screening for infections with a predominantly sexual mode of transmission: Secondary | ICD-10-CM | POA: Diagnosis not present

## 2018-03-03 DIAGNOSIS — Z1159 Encounter for screening for other viral diseases: Secondary | ICD-10-CM | POA: Diagnosis not present

## 2018-03-13 DIAGNOSIS — M542 Cervicalgia: Secondary | ICD-10-CM | POA: Diagnosis not present

## 2018-03-13 DIAGNOSIS — M546 Pain in thoracic spine: Secondary | ICD-10-CM | POA: Diagnosis not present

## 2018-03-15 DIAGNOSIS — B079 Viral wart, unspecified: Secondary | ICD-10-CM | POA: Diagnosis not present

## 2018-03-15 DIAGNOSIS — D229 Melanocytic nevi, unspecified: Secondary | ICD-10-CM | POA: Diagnosis not present

## 2018-03-15 DIAGNOSIS — L570 Actinic keratosis: Secondary | ICD-10-CM | POA: Diagnosis not present

## 2018-03-20 ENCOUNTER — Other Ambulatory Visit: Payer: Self-pay | Admitting: Adult Health

## 2018-03-20 DIAGNOSIS — F5102 Adjustment insomnia: Secondary | ICD-10-CM

## 2018-03-20 MED ORDER — AMPHETAMINE-DEXTROAMPHET ER 15 MG PO CP24: 15.0000 mg | ORAL_CAPSULE | Freq: Every day | ORAL | 0 refills | Status: DC

## 2018-03-20 NOTE — Telephone Encounter (Signed)
Pt has called for a refill on her amphetamine-dextroamphetamine (ADDERALL XR) 15 MG 24 hr capsule [facility] [place]

## 2018-03-20 NOTE — Addendum Note (Signed)
Addended by: Minna Antis on: 03/20/2018 08:40 AM   Modules accepted: Orders

## 2018-03-27 DIAGNOSIS — E039 Hypothyroidism, unspecified: Secondary | ICD-10-CM | POA: Diagnosis not present

## 2018-03-27 DIAGNOSIS — N951 Menopausal and female climacteric states: Secondary | ICD-10-CM | POA: Diagnosis not present

## 2018-03-29 DIAGNOSIS — G479 Sleep disorder, unspecified: Secondary | ICD-10-CM | POA: Diagnosis not present

## 2018-03-29 DIAGNOSIS — N951 Menopausal and female climacteric states: Secondary | ICD-10-CM | POA: Diagnosis not present

## 2018-03-29 DIAGNOSIS — E039 Hypothyroidism, unspecified: Secondary | ICD-10-CM | POA: Diagnosis not present

## 2018-03-29 DIAGNOSIS — M2550 Pain in unspecified joint: Secondary | ICD-10-CM | POA: Diagnosis not present

## 2018-04-12 DIAGNOSIS — B079 Viral wart, unspecified: Secondary | ICD-10-CM | POA: Diagnosis not present

## 2018-04-12 DIAGNOSIS — B009 Herpesviral infection, unspecified: Secondary | ICD-10-CM | POA: Diagnosis not present

## 2018-04-13 ENCOUNTER — Other Ambulatory Visit: Payer: Self-pay | Admitting: Neurology

## 2018-04-13 DIAGNOSIS — F5102 Adjustment insomnia: Secondary | ICD-10-CM

## 2018-04-13 DIAGNOSIS — F5105 Insomnia due to other mental disorder: Secondary | ICD-10-CM

## 2018-05-02 ENCOUNTER — Telehealth: Payer: Self-pay | Admitting: Neurology

## 2018-05-02 ENCOUNTER — Other Ambulatory Visit: Payer: Self-pay | Admitting: Neurology

## 2018-05-02 DIAGNOSIS — F5102 Adjustment insomnia: Secondary | ICD-10-CM

## 2018-05-02 MED ORDER — AMPHETAMINE-DEXTROAMPHET ER 15 MG PO CP24: 15.0000 mg | ORAL_CAPSULE | Freq: Every day | ORAL | 0 refills | Status: DC

## 2018-05-02 NOTE — Telephone Encounter (Signed)
Pt is calling needing a refill for her amphetamine-dextroamphetamine (ADDERALL XR) 15 MG 24 hr capsule and sent to the [facility] on [address].

## 2018-05-02 NOTE — Telephone Encounter (Signed)
I have routed this request to Dr Dohmeier for review. The pt is due for the medication and [place] registry was verified.  

## 2018-05-29 ENCOUNTER — Encounter: Payer: Self-pay | Admitting: Family Medicine

## 2018-05-29 ENCOUNTER — Ambulatory Visit (INDEPENDENT_AMBULATORY_CARE_PROVIDER_SITE_OTHER): Payer: BLUE CROSS/BLUE SHIELD | Admitting: Family Medicine

## 2018-05-29 VITALS — BP 120/82 | HR 80 | Temp 98.1°F | Resp 16 | Wt 115.4 lb

## 2018-05-29 DIAGNOSIS — R6889 Other general symptoms and signs: Secondary | ICD-10-CM

## 2018-05-29 DIAGNOSIS — J029 Acute pharyngitis, unspecified: Secondary | ICD-10-CM

## 2018-05-29 NOTE — Patient Instructions (Signed)
Your flu swab and strep swabs are negative.   I suspect your symptoms are related to a viral illness and recommend treating your symptoms at this point.  Mucinex DM for congestion and cough, drink extra water, use salt water gargles for throat irritation and Tylenol or Ibuprofen for aches and pains.  Call if you are not improving in 2-3 days or if you develop any worsening or new symptoms.

## 2018-05-29 NOTE — Progress Notes (Signed)
Chief Complaint  Patient presents with  . flu exposure    started last week, sneezing. 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 alittle cough, headache, no body aches, some chills   Subjective:  Susan Fisher is a 53 y.o. female who presents for 5 day history of fatigue, feeling feverish, chills, sinus pressure, chest congestion. States sore throat started today.  Some mild diarrhea.   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   Did not get a flu shot.   Treatment to date: antihistamines.  Positive sick contacts.  No other aggravating or relieving factors.  No other c/o.  ROS as in subjective.   Objective: Vitals:   05/29/18 1459  BP: 120/82  Pulse: 80  Resp: 16  Temp: 98.1 F (36.7 C)  SpO2: 98%    General appearance: Alert, WD/WN, no distress, mildly ill appearing                             Skin: warm, no rash                           Head: no sinus tenderness                            Eyes: conjunctiva normal, corneas clear, PERRLA                            Ears: pearly TMs, external ear canals normal                          Nose: septum midline, turbinates swollen, with erythema and clear discharge             Mouth/throat: MMM, tongue normal, mild pharyngeal erythema, no edema or exudate                           Neck: supple, no adenopathy, no thyromegaly, nontender                          Heart: RRR, normal S1, S2, no murmurs                         Lungs: CTA bilaterally, no wheezes, rales, or rhonchi      Assessment: Flu-like symptoms  Acute pharyngitis, unspecified etiology    Plan: Strep swab negative Flu swab negative  Discussed that her symptoms are nonspecific currently.  Suggested symptomatic OTC remedies.Mucinex DM. Salt water gargles.  Nasal saline spray for congestion.  Tylenol or Ibuprofen OTC for fever and malaise.  Call/return in 2-3 days if symptoms aren't resolving.

## 2018-06-06 ENCOUNTER — Ambulatory Visit (INDEPENDENT_AMBULATORY_CARE_PROVIDER_SITE_OTHER): Payer: BLUE CROSS/BLUE SHIELD | Admitting: Neurology

## 2018-06-06 ENCOUNTER — Encounter: Payer: Self-pay | Admitting: Neurology

## 2018-06-06 DIAGNOSIS — F5105 Insomnia due to other mental disorder: Secondary | ICD-10-CM | POA: Diagnosis not present

## 2018-06-06 DIAGNOSIS — F5102 Adjustment insomnia: Secondary | ICD-10-CM

## 2018-06-06 MED ORDER — QUETIAPINE FUMARATE 25 MG PO TABS: 25.0000 mg | ORAL_TABLET | Freq: Every day | ORAL | 2 refills | Status: DC

## 2018-06-06 MED ORDER — AMPHETAMINE-DEXTROAMPHET ER 15 MG PO CP24: 15.0000 mg | ORAL_CAPSULE | Freq: Every day | ORAL | 0 refills | Status: DC

## 2018-06-06 NOTE — Patient Instructions (Signed)
Please remember to try to maintain good sleep hygiene, which means: Keep a regular sleep and wake schedule, try not to exercise or have a meal within 2 hours of your bedtime, try to keep your bedroom conducive for sleep, that is, cool and dark, without light distractors such as an illuminated alarm clock, and refrain from watching TV right before sleep or in the middle of the night and do not keep the TV or radio on during the night. Also, try not to use or play on electronic devices at bedtime, such as your cell phone, tablet PC or laptop. If you like to read at bedtime on an electronic device, try to dim the background light as much as possible. Do not eat in the middle of the night.   For chronic insomnia, you are best followed by a psychiatrist and/or sleep psychologist.    please review the 14 days to better sleep instructions.   Rv with Np in 6 month   Larey Seat, MD

## 2018-06-06 NOTE — Progress Notes (Signed)
PATIENT: Susan Fisher DOB: [birth year]  REASON FOR VISIT: follow up- excessive daytime sleepiness, insomnia HISTORY FROM: patient, alone, 06-06-2018   HISTORY 11/26/14: MM  Susan Fisher is a 53 year old female with a history of insomnia and excessive daytime sleepiness.  At the last visit the patient's Adderall was increased to 15mg  she states that this has been working well. She continues to take Seroquel at bedtime. She states in the last couple weeks she's been under additional stress due to starting school. She is enrolled at [facility] for the physical therapy program. She feels that her stress has caused her insomnia to worsen. She states that she usually tries to go to bed between 10 and 11 PM and arises at 6:30 AM. She denies any new symptoms. She returns today for an evaluation.   MM - 05/2015 Susan Fisher is a 53 year old female with a history of excessive daytime sleepiness and insomnia. She returns today for an evaluation. The patient continues to take Adderall 15 mg daily. She feels that this is working well for her. She states that if she was not taking this medication she feels that she would be a "zombie." She feels that this is partly due to her being in school for physical therapy. She states that the program is very rigorous and takes up most of her time. She continues using Seroquel at night. She states that this works well unless someone spends the night at her house. She states that they get up during the night to use the restroom and she hears them she will be unable to fall back asleep. She denies any new neurological symptoms. She returns today for an evaluation.  CD -I have the pleasure of seeing Susan Fisher today on 11/26/2015 in a routine revisit. The patient has chronic insomnia which contains both elements the inability to initiate sleep at times and the inability to maintain or sustain sleep. She has been doing well with using Seroquel she does need to take Adderall in daytime also it  may interfere his nocturnal sleep for excessive daytime sleepiness.It helped her to study as well to focus and concentrate and not be distracted. 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  CD: Interval history from 11/30/2016.  Susan Fisher  presents with a normal question. 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 She has more joint pain and wondered if this is related or if it could be part of a systemic problem. She has seen her ophthalmologist multiple times, was diagnosed as having a corneal abrasion, now her physician feels that it could be also dry eye causing her symptoms. So a systemic rheumatological or autoimmune disease could be part of the problem, I treat this patient for ADD, insomnia, but  joint pain and neck pain have not been part of this sleep related complex. She also takes Adderall and daytime which could give her a dry mouth but usually does not cause dry eyes. Dr Jill Alexanders, her primary care physician, tested her for several rheumatological disorders and the laboratory results are available in Epic.  Interval history 06-06-2018,  I am meeting him and revisited today with Susan Fisher, who prefers to go by the name [name]. The patient has been followed here for cyclic insomnia and this pattern has persisted over almost a decade maybe longer.  There will be 5 nights of good sleep  followed by 1 or 2 nights of poor sleep affecting daytime alertness and fatigue.  Overall she has been successful she finished her PTA- physical therapy assistant education and she is now working in different settings to see where she may pursue her career. Today she is less fatigued than in any of the last 3 visits, her sleepiness has not changed according to her Epworth score, she still has recurrent problems with dry eyes and corneal inflammation.  A rheumatological work-up  was negative.  We are meeting today for refills of medications related to her insomnia and sleep. She also reports mild resting tremor.    REVIEW OF SYSTEMS: Out of a complete 14 system review of symptoms, the patient complains only of the following symptoms, and all other reviewed systems are negative.  Cyclic Insomnia, variable daytime sleepiness, Epworth sleepiness score: 10/ 24 points  and fatigue severity score down to 38 from 46 points .   pinkish , reddish finger berries, mild tremor, right ring finger is bend.   Joint pain, not myalgia,  not scleroderma, no dysphagia, no HTN, mouth is not dry,  eyes are dry.   ALLERGIES: Allergies  Allergen Reactions  . Gluten Meal Other (See Comments)    Gluten intolerant    HOME MEDICATIONS: Outpatient Medications Prior to Visit  Medication Sig Dispense Refill  . ALPRAZolam (XANAX) 0.25 MG tablet Take 1 tablet (0.25 mg total) by mouth 2 (two) times daily as needed for anxiety. 20 tablet 0  . amphetamine-dextroamphetamine (ADDERALL XR) 15 MG 24 hr capsule Take 1 capsule by mouth daily. 30 capsule 0  . diclofenac (VOLTAREN) 75 MG EC tablet   0  . Polyethyl Glycol-Propyl Glycol (SYSTANE OP) Place 1 drop into both eyes 2 (two) times daily.    . QUEtiapine (SEROQUEL) 25 MG tablet TAKE 1 TABLET BY MOUTH AT BEDTIME 90 tablet 2  . tiZANidine (ZANAFLEX) 4 MG tablet Take 2-4 mg by mouth 3 (three) times daily as needed for spasms.  0  . valACYclovir (VALTREX) 1000 MG tablet Take 2 tablets (2,000 mg total) by mouth 2 (two) times daily. X 1 day as needed for fever blister outbreak 8 tablet 2   No facility-administered medications prior to visit.     PAST MEDICAL HISTORY: Past Medical History:  Diagnosis Date  . Anxiety   . Chronic insomnia   . Depression    history of  . Gluten intolerance 2011   Dr Marquette Saa  . Pneumonia    childhood  . Seasonal allergic rhinitis   . Sleep disorder    Dr Beacher May, chronic and severe insomnia  . Torn  ACL (anterior cruciate ligament)    left    PAST SURGICAL HISTORY: Past Surgical History:  Procedure Laterality Date  . ANTERIOR CRUCIATE LIGAMENT REPAIR Right 08/2013   [name]  . DILATATION & CURETTAGE/HYSTEROSCOPY WITH MYOSURE N/A 04/22/2016   Procedure: [place];  Surgeon: Servando Salina, MD;  Location: [facility];  Service: Gynecology;  Laterality: N/A;  . DILATATION & CURETTAGE/HYSTEROSCOPY WITH MYOSURE N/A 08/31/2016   Procedure: [place];  Surgeon: Servando Salina, MD;  Location: [facility];  Service: Gynecology;  Laterality: N/A;  . KNEE ARTHROSCOPY Left 1995  . UPPER GASTROINTESTINAL ENDOSCOPY  2008   neg; Dr Watt Climes  . UPPER GASTROINTESTINAL ENDOSCOPY  2009    [facility]    FAMILY HISTORY: Family History  Problem Relation Age of Onset  . Anxiety disorder Mother   . Depression  Mother   . Hypertension Mother   . Stroke Mother 80  . Heart failure Mother   . Heart attack Mother 30  . Breast cancer Mother 69  . COPD Mother        smoker  . Pulmonary embolism Father 32       post rotator cuff surgery  . Heart attack Paternal Grandfather 28  . Tremor Sister   . Tremor Sister     SOCIAL HISTORY: Social History   Socioeconomic History  . Marital status: Single    Spouse name: Not on file  . Number of children: 0  . Years of education: College  . Highest education level: Not on file  Occupational History    Employer: [employer]  . Financial resource strain: Not on file  . Food insecurity:    Worry: Not on file    Inability: Not on file  . Transportation needs:    Medical: Not on file    Non-medical: Not on file  Tobacco Use  . Smoking status: Never Smoker  . Smokeless tobacco: Never Used  Substance and Sexual Activity  . Alcohol use: Yes    Alcohol/week: 7.0 standard drinks    Types: 7 Glasses of wine per week    Comment:  socially  . Drug use: No  . Sexual  activity: Not on file  Lifestyle  . Physical activity:    Days per week: Not on file    Minutes per session: Not on file  . Stress: Not on file  Relationships  . Social connections:    Talks on phone: Not on file    Gets together: Not on file    Attends religious service: Not on file    Active member of club or organization: Not on file    Attends meetings of clubs or organizations: Not on file    Relationship status: Not on file  . Intimate partner violence:    Fear of current or ex partner: Not on file    Emotionally abused: Not on file    Physically abused: Not on file    Forced sexual activity: Not on file  Other Topics Concern  . Not on file  Social History Narrative   Patient is single and lives alone.     Patient has a college education.   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 -    planning to enroll with PT asst school at [employer] fall 2016   Patient drinks two caffeine drinks daily.   Patient is right-handed.      PHYSICAL EXAM  Vitals:   06/06/18 1018  Weight: 114 lb (51.7 kg)  Height: 4' 10.5" (1.486 m)   Body mass index is 23.42 kg/m.  Generalized: Well developed, in no acute distress   Neurological examination  Mentation: Alert oriented to time, place, history taking. Follows all commands speech and language fluent Cranial nerve ; no change in taste or smell.  Pupils were equal round reactive to light. Extraocular movements were full, visual field were full on confrontational test. Facial sensation and strength were normal.  Uvula and tongue midline.  Head turning and shoulder shrug  were normal and symmetric.  Motor: strength of all 4 extremities.  Non -rigid, symmetric motor tone is noted throughout.  Sensory: intact to touch, pin prick and vibration in all extremities, no evidence of extinction is noted.  Coordination: bilateral finger-nose without ataxia, dysmetria but noted mild bilateral action tremor, no changes  in penmanship.  . Gait  and station: Gait is normal.  No drift is seen.  Reflexes: Deep tendon reflexes are symmetric bilaterally.   DIAGNOSTIC DATA (LABS, IMAGING, TESTING) - I reviewed patient records, labs, notes, testing and imaging myself where available.  Lab Results  Component Value Date   WBC 13.3 (H) 08/31/2016   HGB 15.8 (H) 08/31/2016   HCT 47.5 (H) 08/31/2016   MCV 92.1 08/31/2016   PLT 259 08/31/2016      Component Value Date/Time   NA 137 11/22/2016 1408   K 4.4 11/22/2016 1408   CL 103 11/22/2016 1408   CO2 22 11/22/2016 1408   GLUCOSE 76 11/22/2016 1408   BUN 18 11/22/2016 1408   CREATININE 0.76 11/22/2016 1408   CALCIUM 9.3 11/22/2016 1408   PROT 6.4 11/22/2016 1408   ALBUMIN 4.3 11/22/2016 1408   AST 19 11/22/2016 1408   ALT 12 11/22/2016 1408   ALKPHOS 50 11/22/2016 1408   BILITOT 0.6 11/22/2016 1408   GFRNONAA 86 (L) 12/14/2012 0250   GFRAA >90 12/14/2012 0250   Lab Results  Component Value Date   CHOL 159 11/08/2014   HDL 72.90 11/08/2014   LDLCALC 73 11/08/2014   TRIG 64.0 11/08/2014   CHOLHDL 2 11/08/2014    Lab Results  Component Value Date   TSH 2.70 11/08/2014      ASSESSMENT AND PLAN 53 y.o. year old female  has a past medical history of Anxiety, Chronic insomnia, Depression, Gluten intolerance (2011), Pneumonia, Seasonal allergic rhinitis, Sleep disorder, and Torn ACL (anterior cruciate ligament). here with:  1. Insomnia, She is getting on average 6 hours of nocturnal sleep on Seroquel . There are difficulties to fall asleep and stay asleep recorded. Stress is a component .  2.Controlled sleepiness currently on ADD mediation. .  Overall the patient is doing well. She will continue on Adderall and seroquel.  I will refill these medications today. I recommned a book for her , WHY WE SLEEP, Chiquita Loth, PHD.   The patient advised that if her symptoms worsen or she develops any new symptoms she should let us know. She will follow-up in 12 months with NP  and we will continue to check for tremor and capture possible development of cog-wheeling.     Larey Seat, MD  06/06/2018, 10:44 AM   Medical Director of [facility] [address] [place], [place] [address] [phone]

## 2018-06-14 ENCOUNTER — Other Ambulatory Visit: Payer: Self-pay | Admitting: Family Medicine

## 2018-06-14 NOTE — Telephone Encounter (Signed)
[facility] is requesting to fill pt valtrex. Please advise [facility]

## 2018-06-15 DIAGNOSIS — H524 Presbyopia: Secondary | ICD-10-CM | POA: Diagnosis not present

## 2018-06-15 DIAGNOSIS — H16223 Keratoconjunctivitis sicca, not specified as Sjogren's, bilateral: Secondary | ICD-10-CM | POA: Diagnosis not present

## 2018-06-15 DIAGNOSIS — H0288B Meibomian gland dysfunction left eye, upper and lower eyelids: Secondary | ICD-10-CM | POA: Diagnosis not present

## 2018-06-15 DIAGNOSIS — H0288A Meibomian gland dysfunction right eye, upper and lower eyelids: Secondary | ICD-10-CM | POA: Diagnosis not present

## 2018-06-15 DIAGNOSIS — H538 Other visual disturbances: Secondary | ICD-10-CM | POA: Diagnosis not present

## 2018-06-15 DIAGNOSIS — H5203 Hypermetropia, bilateral: Secondary | ICD-10-CM | POA: Diagnosis not present

## 2018-07-10 ENCOUNTER — Telehealth: Payer: Self-pay | Admitting: Neurology

## 2018-07-10 ENCOUNTER — Other Ambulatory Visit: Payer: Self-pay | Admitting: Neurology

## 2018-07-10 DIAGNOSIS — F5102 Adjustment insomnia: Secondary | ICD-10-CM

## 2018-07-10 NOTE — Telephone Encounter (Signed)
Pt is calling in for a refill of amphetamine-dextroamphetamine (ADDERALL XR) 15 MG 24 hr capsule to be sent to  [facility] [place] [address] AT [address] [phone] (Phone) [phone] (Fax)

## 2018-07-10 NOTE — Telephone Encounter (Signed)
I have routed this request to Dr Dohmeier for review. The pt is due for the medication and [place] registry was verified.  

## 2018-07-11 MED ORDER — AMPHETAMINE-DEXTROAMPHET ER 15 MG PO CP24: 15.0000 mg | ORAL_CAPSULE | Freq: Every day | ORAL | 0 refills | Status: DC

## 2018-08-21 ENCOUNTER — Other Ambulatory Visit: Payer: Self-pay | Admitting: Neurology

## 2018-08-21 DIAGNOSIS — F5102 Adjustment insomnia: Secondary | ICD-10-CM

## 2018-08-21 MED ORDER — AMPHETAMINE-DEXTROAMPHET ER 15 MG PO CP24
15.0000 mg | ORAL_CAPSULE | Freq: Every day | ORAL | 0 refills | Status: DC
Start: 2018-08-21 — End: 2018-11-01

## 2018-08-21 NOTE — Telephone Encounter (Signed)
I have routed this request to Dr Dohmeier for review. The pt is due for the medication and [place] registry was verified.  

## 2018-08-21 NOTE — Telephone Encounter (Signed)
Pt has called for a refill on her amphetamine-dextroamphetamine (ADDERALL XR) 15 MG 24 hr capsule [facility] [phone]

## 2018-08-21 NOTE — Addendum Note (Signed)
Addended by: Darleen Crocker on: 08/21/2018 01:37 PM   Modules accepted: Orders

## 2018-09-07 DIAGNOSIS — M84374A Stress fracture, right foot, initial encounter for fracture: Secondary | ICD-10-CM | POA: Diagnosis not present

## 2018-09-27 DIAGNOSIS — H18603 Keratoconus, unspecified, bilateral: Secondary | ICD-10-CM | POA: Diagnosis not present

## 2018-09-27 DIAGNOSIS — H40013 Open angle with borderline findings, low risk, bilateral: Secondary | ICD-10-CM | POA: Diagnosis not present

## 2018-09-27 DIAGNOSIS — H35371 Puckering of macula, right eye: Secondary | ICD-10-CM | POA: Diagnosis not present

## 2018-09-27 DIAGNOSIS — H52203 Unspecified astigmatism, bilateral: Secondary | ICD-10-CM | POA: Diagnosis not present

## 2018-10-23 ENCOUNTER — Encounter: Payer: BLUE CROSS/BLUE SHIELD | Admitting: Family Medicine

## 2018-11-01 ENCOUNTER — Telehealth: Payer: Self-pay | Admitting: Neurology

## 2018-11-01 ENCOUNTER — Other Ambulatory Visit: Payer: Self-pay | Admitting: Neurology

## 2018-11-01 DIAGNOSIS — F5102 Adjustment insomnia: Secondary | ICD-10-CM

## 2018-11-01 MED ORDER — AMPHETAMINE-DEXTROAMPHET ER 15 MG PO CP24: 15.0000 mg | ORAL_CAPSULE | Freq: Every day | ORAL | 0 refills | Status: DC

## 2018-11-01 NOTE — Telephone Encounter (Signed)
I have routed this request to Dr Dohmeier for review. The pt is due for the medication and [place] registry was verified.  

## 2018-11-01 NOTE — Telephone Encounter (Signed)
Pt is needing a refill on her amphetamine-dextroamphetamine (ADDERALL XR) 15 MG 24 hr capsule sent to [facility] on [address].

## 2018-11-08 ENCOUNTER — Ambulatory Visit (INDEPENDENT_AMBULATORY_CARE_PROVIDER_SITE_OTHER): Payer: BC Managed Care – PPO | Admitting: Family Medicine

## 2018-11-08 ENCOUNTER — Encounter: Payer: Self-pay | Admitting: Family Medicine

## 2018-11-08 ENCOUNTER — Other Ambulatory Visit: Payer: Self-pay

## 2018-11-08 VITALS — BP 120/78 | HR 76 | Temp 97.9°F | Ht <= 58 in | Wt 113.5 lb

## 2018-11-08 DIAGNOSIS — Z23 Encounter for immunization: Secondary | ICD-10-CM

## 2018-11-08 DIAGNOSIS — Z0000 Encounter for general adult medical examination without abnormal findings: Secondary | ICD-10-CM | POA: Diagnosis not present

## 2018-11-08 DIAGNOSIS — K9041 Non-celiac gluten sensitivity: Secondary | ICD-10-CM

## 2018-11-08 DIAGNOSIS — E2839 Other primary ovarian failure: Secondary | ICD-10-CM

## 2018-11-08 DIAGNOSIS — J302 Other seasonal allergic rhinitis: Secondary | ICD-10-CM

## 2018-11-08 DIAGNOSIS — G479 Sleep disorder, unspecified: Secondary | ICD-10-CM

## 2018-11-08 DIAGNOSIS — M5090 Cervical disc disorder, unspecified, unspecified cervical region: Secondary | ICD-10-CM

## 2018-11-08 DIAGNOSIS — Z1211 Encounter for screening for malignant neoplasm of colon: Secondary | ICD-10-CM

## 2018-11-08 DIAGNOSIS — B001 Herpesviral vesicular dermatitis: Secondary | ICD-10-CM

## 2018-11-08 DIAGNOSIS — M1990 Unspecified osteoarthritis, unspecified site: Secondary | ICD-10-CM

## 2018-11-08 NOTE — Progress Notes (Signed)
Subjective:    Patient ID: Susan Fisher, female    DOB: [birth year], 53 y.o.   MRN: [id number]  HPI She is here for complete examination.  She does have a previous history of a stress fracture of the fifth metatarsal and is concerned about her bone density.  Presently she is not having any foot pain.  She is now menopausal.  Her last menses was approximately 6 months ago.  She plans to see her gynecologist in the near future. She also has a history of chronic insomnia and is seen by Dr. Brett Fairy for this.  She does have cervical disc disease and usually responds to occasional use of Zanaflex or Voltaren.  Presently she is taking Valtrex on a daily basis for prevention of herpes labialis.  Her seasonal allergies seem to be under good control.  She does have a history of gluten intolerance but does state that she can eat small amounts of gluten without too much difficulty.  She is presently unemployed due to the [place] pandemic.  Family and social history as well as health maintenance and immunizations was reviewed.  Review of Systems  All other systems reviewed and are negative.      Objective:   Physical Exam BP 120/78 (BP Location: Left Arm, Patient Position: Sitting)   Pulse 76   Temp 97.9 F (36.6 C)   Ht 4' 9.5" (1.461 m)   Wt 113 lb 8 oz (51.5 kg)   SpO2 99%   BMI 24.14 kg/m   General Appearance:    Alert, cooperative, no distress, appears stated age  Head:    Normocephalic, without obvious abnormality, atraumatic  Eyes:    PERRL, conjunctiva/corneas clear, EOM's intact, fundi    benign  Ears:    Normal TM's and external ear canals  Nose:   Nares normal, mucosa normal, no drainage or sinus   tenderness  Throat:   Lips, mucosa, and tongue normal; teeth and gums normal  Neck:   Supple, no lymphadenopathy;  thyroid:  no   enlargement/tenderness/nodules; no carotid   bruit or JVD     Lungs:     Clear to auscultation bilaterally without wheezes, rales or     ronchi; respirations  unlabored      Heart:    Regular rate and rhythm, S1 and S2 normal, no murmur, rub   or gallop  Breast Exam:    Deferred to GYN  Abdomen:     Soft, non-tender, nondistended, normoactive bowel sounds,    no masses, no hepatosplenomegaly  Genitalia:    Deferred to GYN     Extremities:   No clubbing, cyanosis or edema  Pulses:   2+ and symmetric all extremities  Skin:   Skin color, texture, turgor normal, no rashes or lesions  Lymph nodes:   Cervical, supraclavicular, and axillary nodes normal  Neurologic:   CNII-XII intact, normal strength, sensation and gait; reflexes 2+ and symmetric throughout          Psych:   Normal mood, affect, hygiene and grooming.       Assessment & Plan:  Routine general medical examination at a health care facility - Plan: CBC with Differential/Platelet, Comprehensive metabolic panel, Lipid panel,   Estrogen deficiency - Plan: DG Bone Density, with her history of stress fracture and now postmenopausal, DEXA scan I think is reasonable  Disturbance in sleep behavior - Plan: She will continue to be followed by Dr. Brett Fairy  Seasonal allergies - Plan: Treat as needed  Gluten intolerance - Plan: She is handling this appropriately  Arthritis - Plan: Continue on Voltaren as needed  Cervical disc disease - Plan: Continue on Voltaren and Zanaflex.  Herpes labialis - Plan: She will continue on Valtrex.  Screening for colon cancer - Plan: Cologuard,   Need for Tdap vaccination - Plan: Tdap vaccine greater than or equal to 7yo IM,

## 2018-11-09 LAB — CBC WITH DIFFERENTIAL/PLATELET
Basophils Absolute: 0.1 10*3/uL (ref 0.0–0.2)
Basos: 1 %
EOS (ABSOLUTE): 0.1 10*3/uL (ref 0.0–0.4)
Eos: 1 %
Hematocrit: 40.5 % (ref 34.0–46.6)
Hemoglobin: 13.5 g/dL (ref 11.1–15.9)
Immature Grans (Abs): 0 10*3/uL (ref 0.0–0.1)
Immature Granulocytes: 0 %
Lymphocytes Absolute: 1.5 10*3/uL (ref 0.7–3.1)
Lymphs: 16 %
MCH: 31.6 pg (ref 26.6–33.0)
MCHC: 33.3 g/dL (ref 31.5–35.7)
MCV: 95 fL (ref 79–97)
Monocytes Absolute: 0.5 10*3/uL (ref 0.1–0.9)
Monocytes: 6 %
Neutrophils Absolute: 6.8 10*3/uL (ref 1.4–7.0)
Neutrophils: 76 %
RBC: 4.27 x10E6/uL (ref 3.77–5.28)
RDW: 12.2 % (ref 11.7–15.4)
WBC: 8.9 10*3/uL (ref 3.4–10.8)

## 2018-11-09 LAB — COMPREHENSIVE METABOLIC PANEL
ALT: 13 IU/L (ref 0–32)
AST: 21 IU/L (ref 0–40)
Albumin/Globulin Ratio: 2.3 — ABNORMAL HIGH (ref 1.2–2.2)
Albumin: 4.2 g/dL (ref 3.8–4.9)
Alkaline Phosphatase: 43 IU/L (ref 39–117)
BUN/Creatinine Ratio: 14 (ref 9–23)
BUN: 10 mg/dL (ref 6–24)
Bilirubin Total: 0.4 mg/dL (ref 0.0–1.2)
CO2: 19 mmol/L — ABNORMAL LOW (ref 20–29)
Calcium: 8.8 mg/dL (ref 8.7–10.2)
Chloride: 104 mmol/L (ref 96–106)
Creatinine, Ser: 0.72 mg/dL (ref 0.57–1.00)
GFR calc Af Amer: 111 mL/min/{1.73_m2} (ref >59)
GFR calc non Af Amer: 97 mL/min/{1.73_m2} (ref >59)
Globulin, Total: 1.8 g/dL (ref 1.5–4.5)
Glucose: 87 mg/dL (ref 65–99)
Potassium: 4.1 mmol/L (ref 3.5–5.2)
Sodium: 139 mmol/L (ref 134–144)
Total Protein: 6 g/dL (ref 6.0–8.5)

## 2018-11-09 LAB — LIPID PANEL
Chol/HDL Ratio: 2.2 ratio (ref 0.0–4.4)
Cholesterol, Total: 189 mg/dL (ref 100–199)
HDL: 87 mg/dL (ref >39)
LDL Calculated: 88 mg/dL (ref 0–99)
Triglycerides: 71 mg/dL (ref 0–149)
VLDL Cholesterol Cal: 14 mg/dL (ref 5–40)

## 2018-11-26 LAB — HM MAMMOGRAPHY

## 2018-12-20 ENCOUNTER — Telehealth: Payer: Self-pay | Admitting: Neurology

## 2018-12-20 ENCOUNTER — Other Ambulatory Visit: Payer: Self-pay | Admitting: Neurology

## 2018-12-20 DIAGNOSIS — M5412 Radiculopathy, cervical region: Secondary | ICD-10-CM | POA: Diagnosis not present

## 2018-12-20 DIAGNOSIS — M542 Cervicalgia: Secondary | ICD-10-CM | POA: Diagnosis not present

## 2018-12-20 DIAGNOSIS — M25512 Pain in left shoulder: Secondary | ICD-10-CM | POA: Diagnosis not present

## 2018-12-20 DIAGNOSIS — F5102 Adjustment insomnia: Secondary | ICD-10-CM

## 2018-12-20 MED ORDER — AMPHETAMINE-DEXTROAMPHET ER 15 MG PO CP24: 15.0000 mg | ORAL_CAPSULE | Freq: Every day | ORAL | 0 refills | Status: DC

## 2018-12-20 NOTE — Telephone Encounter (Signed)
Pt is needing a refill on her amphetamine-dextroamphetamine (ADDERALL XR) 15 MG 24 hr capsule sent to the [facility] on [address].

## 2018-12-20 NOTE — Telephone Encounter (Signed)
I have routed this request to Dr Dohmeier for review. The pt is due for the medication and [place] registry was verified.  

## 2018-12-21 ENCOUNTER — Encounter: Payer: Self-pay | Admitting: Family Medicine

## 2018-12-21 ENCOUNTER — Ambulatory Visit
Admission: RE | Admit: 2018-12-21 | Discharge: 2018-12-21 | Disposition: A | Discharge status: Home / Self Care | Payer: BC Managed Care – PPO | Source: Ambulatory Visit | Attending: Family Medicine | Admitting: Family Medicine

## 2018-12-21 ENCOUNTER — Other Ambulatory Visit: Payer: Self-pay

## 2018-12-21 DIAGNOSIS — M85851 Other specified disorders of bone density and structure, right thigh: Secondary | ICD-10-CM | POA: Diagnosis not present

## 2018-12-21 DIAGNOSIS — E2839 Other primary ovarian failure: Secondary | ICD-10-CM

## 2018-12-21 DIAGNOSIS — Z780 Asymptomatic menopausal state: Secondary | ICD-10-CM | POA: Diagnosis not present

## 2018-12-22 ENCOUNTER — Encounter: Payer: Self-pay | Admitting: Family Medicine

## 2018-12-27 ENCOUNTER — Ambulatory Visit (INDEPENDENT_AMBULATORY_CARE_PROVIDER_SITE_OTHER): Payer: BC Managed Care – PPO | Admitting: Adult Health

## 2018-12-27 ENCOUNTER — Ambulatory Visit (INDEPENDENT_AMBULATORY_CARE_PROVIDER_SITE_OTHER): Payer: BC Managed Care – PPO | Admitting: Family Medicine

## 2018-12-27 ENCOUNTER — Encounter: Payer: Self-pay | Admitting: Adult Health

## 2018-12-27 ENCOUNTER — Other Ambulatory Visit: Payer: Self-pay

## 2018-12-27 ENCOUNTER — Encounter: Payer: Self-pay | Admitting: Family Medicine

## 2018-12-27 VITALS — BP 110/72 | HR 72 | Temp 98.2°F | Wt 116.8 lb

## 2018-12-27 VITALS — BP 130/90 | HR 62 | Temp 97.8°F | Ht 58.5 in | Wt 115.4 lb

## 2018-12-27 DIAGNOSIS — M8080XA Other osteoporosis with current pathological fracture, unspecified site, initial encounter for fracture: Secondary | ICD-10-CM

## 2018-12-27 DIAGNOSIS — R400 Somnolence: Secondary | ICD-10-CM

## 2018-12-27 DIAGNOSIS — Z01419 Encounter for gynecological examination (general) (routine) without abnormal findings: Secondary | ICD-10-CM | POA: Diagnosis not present

## 2018-12-27 DIAGNOSIS — F5102 Adjustment insomnia: Secondary | ICD-10-CM

## 2018-12-27 DIAGNOSIS — Z1151 Encounter for screening for human papillomavirus (HPV): Secondary | ICD-10-CM | POA: Diagnosis not present

## 2018-12-27 DIAGNOSIS — Z6824 Body mass index (BMI) 24.0-24.9, adult: Secondary | ICD-10-CM | POA: Diagnosis not present

## 2018-12-27 DIAGNOSIS — Z1231 Encounter for screening mammogram for malignant neoplasm of breast: Secondary | ICD-10-CM | POA: Diagnosis not present

## 2018-12-27 MED ORDER — IBANDRONATE SODIUM 150 MG PO TABS
150.0000 mg | ORAL_TABLET | ORAL | 3 refills | Status: DC
Start: 2018-12-27 — End: 2020-10-14

## 2018-12-27 NOTE — Progress Notes (Signed)
PATIENT: Susan Fisher DOB: 04-25-1966  REASON FOR VISIT: follow up HISTORY FROM: patient  HISTORY OF PRESENT ILLNESS: Today 12/27/18:  Susan Fisher is a 53 year old female with a history of insomnia and daytime sleepiness.  She returns today for follow-up.  She continues on Seroquel 25 mg at bedtime.  She states that for the most part this works well.  She was recently placed on a 12-day prednisone Dosepak.  She states this has interrupted her sleep.  She states that with the Seroquel she notes that she has a mild tremor in the left upper extremity.  This has remained stable.  She continues to take Adderall extended release 15 mg daily.  She states this continues to work well for her.  She does report there are times that she feels that she Fisher be getting used to the dose but does not wish to make any adjustments at this time.  She returns today for evaluation.  HISTORY (Copied from Susan Fisher's note)  I am meeting him and revisited today with Susan Fisher, who prefers to go by the name Susan Fisher. The patient has been followed here for cyclic insomnia and this pattern has persisted over almost a decade maybe longer.  There will be 5 nights of good sleep followed by 1 or 2 nights of poor sleep affecting daytime alertness and fatigue.  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 Today she is less fatigued than in any of the last 3 visits, her sleepiness has not changed according to her Epworth score, she still has recurrent problems with dry eyes and corneal inflammation.  A rheumatological work-up was negative.  We are meeting today for refills of medications related to her insomnia and sleep. She also reports mild resting tremor.   REVIEW OF SYSTEMS: Out of a complete 14 system review of symptoms, the patient complains only of the following symptoms, and all  other reviewed systems are negative.  See HPI  ALLERGIES: Allergies  Allergen Reactions  . Gluten Meal Other (See Comments)    Gluten intolerant    HOME MEDICATIONS: Outpatient Medications Prior to Visit  Medication Sig Dispense Refill  . ALPRAZolam (XANAX) 0.25 MG tablet Take 1 tablet (0.25 mg total) by mouth 2 (two) times daily as needed for anxiety. 20 tablet 0  . amphetamine-dextroamphetamine (ADDERALL XR) 15 MG 24 hr capsule Take 1 capsule by mouth daily. 30 capsule 0  . diclofenac (VOLTAREN) 75 MG EC tablet   0  . ibandronate (BONIVA) 150 MG tablet Take 1 tablet (150 mg total) by mouth every 30 (thirty) days. Take in the morning with a full glass of water, on an empty stomach, and do not take anything else by mouth or lie down for the next 30 min. 3 tablet 3  . Polyethyl Glycol-Propyl Glycol (SYSTANE OP) Place 1 drop into both eyes 2 (two) times daily.    . QUEtiapine (SEROQUEL) 25 MG tablet Take 1 tablet (25 mg total) by mouth at bedtime. 90 tablet 2  . tiZANidine (ZANAFLEX) 4 MG tablet Take 2-4 mg by mouth 3 (three) times daily as needed for spasms. As needed  0  . valACYclovir (VALTREX) 1000 MG tablet TAKE 2 TABLETS BY MOUTH TWICE A DAY FOR 1 DAY IF NEEDED FOR FEVER BLISTER OUTBREAK 8 tablet 2   No facility-administered medications prior to visit.  PAST MEDICAL HISTORY: Past Medical History:  Diagnosis Date  . Anxiety   . Chronic insomnia   . Depression    history of  . Gluten intolerance 2011   Susan Susan Fisher  . Pneumonia    childhood  . Seasonal allergic rhinitis   . Sleep disorder    Susan Susan Fisher, chronic and severe insomnia  . Torn ACL (anterior cruciate ligament)    left    PAST SURGICAL HISTORY: Past Surgical History:  Procedure Laterality Date  . ANTERIOR CRUCIATE LIGAMENT REPAIR Right 08/2013   Susan Fisher  . DILATATION & CURETTAGE/HYSTEROSCOPY WITH MYOSURE N/A 04/22/2016   Procedure: [place];  Surgeon:  Susan Salina, MD;  Location[facility];  Service: Gynecology;  Laterality: N/A;  . DILATATION & CURETTAGE/HYSTEROSCOPY WITH MYOSURE N/A 08/31/2016   Procedure: [place];  Surgeon: Susan Salina, MD;  Location: [facility];  Service: Gynecology;  Laterality: N/A;  . KNEE ARTHROSCOPY Left 1995  . UPPER GASTROINTESTINAL ENDOSCOPY  2008   neg; Susan Fisher  . UPPER [facility]AL [facility]OSCOPY  [facility]    FAMILY HISTORY: Family History  Problem Relation Age of Onset  . Anxiety disorder Mother   . Depression Mother   . Hypertension Mother   . Stroke Mother 29  . Heart failure Mother   . Heart attack Mother 36  . Breast cancer Mother 62  . COPD Mother        smoker  . Pulmonary embolism Father 2       post rotator cuff surgery  . Heart attack Paternal Grandfather 18  . Tremor Sister   . Tremor Sister     SOCIAL HISTORY: Social History   Socioeconomic History  . Marital status: Single    Spouse name: Not on file  . Number of children: 0  . Years of education: College  . Highest education level: Not on file  Occupational History    Employer: [employer]  . Financial resource strain: Not on file  . Food insecurity    Worry: Not on file    Inability: Not on file  . Transportation needs    Medical: Not on file    Non-medical: Not on file  Tobacco Use  . Smoking status: Never Smoker  . Smokeless tobacco: Never Used  Substance and Sexual Activity  . Alcohol use: Yes    Alcohol/week: 7.0 standard drinks    Types: 7 Glasses of wine per week    Comment:  socially  . Drug use: No  . Sexual activity: Not Currently  Lifestyle  . Physical activity    Days per week: Not on file    Minutes per session: Not on file  . Stress: Not on file  Relationships  . Social Herbalist on phone: Not on file    Gets together: Not on file    Attends religious service: Not on file    Active member of club or  organization: Not on file    Attends meetings of clubs or organizations: Not on file    Relationship status: Not on file  . Intimate partner violence    Fear of current or ex partner: Not on file    Emotionally abused: Not on file    Physically abused: Not on file    Forced sexual activity: Not on file  Other Topics Concern  . Not on file  Social History Narrative  Patient is single and lives alone.     Patient has a college education.   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   Patient is right-handed.         PHYSICAL EXAM  Vitals:   12/27/18 1023  BP: 130/90  Pulse: 62  Temp: 97.8 F (36.6 C)  Weight: 115 lb 6.4 oz (52.3 kg)  Height: 4' 10.5" (1.486 m)   Body mass index is 23.71 kg/m.  Generalized: Well developed, in no acute distress   Neurological examination  Mentation: Alert oriented to time, place, history taking. Follows all commands speech and language fluent Cranial nerve II-XII: Extraocular movements were full, visual field were full on confrontational test. Head turning and shoulder shrug  were normal and symmetric. Motor: The motor testing reveals 5 over 5 strength of all 4 extremities. Good symmetric motor tone is noted throughout.  Sensory: Sensory testing is intact to soft touch on all 4 extremities. No evidence of extinction is noted.  Coordination: Cerebellar testing reveals good finger-nose-finger and heel-to-shin bilaterally.  Gait and station: Gait is normal.  Reflexes: Deep tendon reflexes are symmetric and normal bilaterally.   DIAGNOSTIC DATA (LABS, IMAGING, TESTING) - I reviewed patient records, labs, notes, testing and imaging myself where available.  Lab Results  Component Value Date   WBC 8.9 11/08/2018   HGB 13.5 11/08/2018   HCT 40.5 11/08/2018   MCV 95 11/08/2018   PLT CANCELED 11/08/2018      Component Value  Date/Time   NA 139 11/08/2018 1445   K 4.1 11/08/2018 1445   CL 104 11/08/2018 1445   CO2 19 (L) 11/08/2018 1445   GLUCOSE 87 11/08/2018 1445   GLUCOSE 76 11/22/2016 1408   BUN 10 11/08/2018 1445   CREATININE 0.72 11/08/2018 1445   CREATININE 0.76 11/22/2016 1408   CALCIUM 8.8 11/08/2018 1445   PROT 6.0 11/08/2018 1445   ALBUMIN 4.2 11/08/2018 1445   AST 21 11/08/2018 1445   ALT 13 11/08/2018 1445   ALKPHOS 43 11/08/2018 1445   BILITOT 0.4 11/08/2018 1445   GFRNONAA 97 11/08/2018 1445   GFRAA 111 11/08/2018 1445   Lab Results  Component Value Date   CHOL 189 11/08/2018   HDL 87 11/08/2018   LDLCALC 88 11/08/2018   TRIG 71 11/08/2018   CHOLHDL 2.2 11/08/2018   No results found for: HGBA1C No results found for: VITAMINB12 Lab Results  Component Value Date   TSH 2.70 11/08/2014      ASSESSMENT AND PLAN 53 y.o. year old female  has a past medical history of Anxiety, Chronic insomnia, Depression, Gluten intolerance (2011), Pneumonia, Seasonal allergic rhinitis, Sleep disorder, and Torn ACL (anterior cruciate ligament). here with:  1.  Insomnia 2.  Excessive daytime sleepiness  Overall the patient has remained stable.  She will continue on Seroquel 25 mg at bedtime.  She will continue Adderall extended release 15 mg daily.  She is advised that if her symptoms worsen or she develops new symptoms she should let us know.  She will follow-up in 6 months or sooner     Ward Givens, MSN, NP-C 12/27/2018, 10:23 AM [facility] [address], [place], [place] [address] [phone]

## 2018-12-27 NOTE — Patient Instructions (Signed)
Your Plan:  Continue Seroquel and Adderall If your symptoms worsen or you develop new symptoms please let us know.   Thank you for coming to see [place] at [facility]. I hope we have been able to provide you high quality care today.  You may receive a patient satisfaction survey over the next few weeks. We would appreciate your feedback and comments so that we may continue to improve ourselves and the health of our patients.

## 2018-12-27 NOTE — Progress Notes (Signed)
   Subjective:    Patient ID: Susan Fisher, female    DOB: [birth year], 53 y.o.   MRN: [id number]  HPI She is here for follow-up after recent DEXA scan.  It showed a T score of -1.9.  She is also had a recent stress fracture which I think we did qualify her as having osteoporosis.  No previous history of vitamin D deficiency.   Review of Systems     Objective:   Physical Exam Alert and in no distress otherwise not examined      Assessment & Plan:  Other osteoporosis with current pathological fracture, initial encounter - Plan: ibandronate (BONIVA) 150 MG tablet, VITAMIN D 25 Hydroxy (Vit-D Deficiency, Fractures) I explained that since she had a low T score and had a stress fracture that that would qualify her as being osteoporotic.  I will also check a vitamin D level.  Discussed options with her and decided to go with Boniva once a month.  Recheck her DEXA scan in 2 years.

## 2018-12-28 LAB — VITAMIN D 25 HYDROXY (VIT D DEFICIENCY, FRACTURES): Vit D, 25-Hydroxy: 53.9 ng/mL (ref 30.0–100.0)

## 2019-01-03 DIAGNOSIS — Z1211 Encounter for screening for malignant neoplasm of colon: Secondary | ICD-10-CM | POA: Diagnosis not present

## 2019-01-04 LAB — COLOGUARD: Cologuard: NEGATIVE

## 2019-01-11 ENCOUNTER — Encounter: Payer: Self-pay | Admitting: Family Medicine

## 2019-01-11 DIAGNOSIS — M25512 Pain in left shoulder: Secondary | ICD-10-CM | POA: Diagnosis not present

## 2019-01-11 NOTE — Progress Notes (Signed)
Called pt to advise of negative cologuard. [place]

## 2019-01-17 DIAGNOSIS — M25512 Pain in left shoulder: Secondary | ICD-10-CM | POA: Diagnosis not present

## 2019-01-17 DIAGNOSIS — Z20828 Contact with and (suspected) exposure to other viral communicable diseases: Secondary | ICD-10-CM | POA: Diagnosis not present

## 2019-02-03 DIAGNOSIS — Z20828 Contact with and (suspected) exposure to other viral communicable diseases: Secondary | ICD-10-CM | POA: Diagnosis not present

## 2019-02-05 ENCOUNTER — Telehealth: Payer: Self-pay | Admitting: Adult Health

## 2019-02-05 DIAGNOSIS — F5102 Adjustment insomnia: Secondary | ICD-10-CM

## 2019-02-05 NOTE — Telephone Encounter (Signed)
Pt called needing a refill on her amphetamine-dextroamphetamine (ADDERALL XR) 15 MG 24 hr capsule sent to the [facility] on [address]

## 2019-02-05 NOTE — Telephone Encounter (Signed)
[place] Database Verified LR: 12-20-2018 Qty: 30 Pending appointment: 07-02-2019

## 2019-02-06 MED ORDER — AMPHETAMINE-DEXTROAMPHET ER 15 MG PO CP24: 15.0000 mg | ORAL_CAPSULE | Freq: Every day | ORAL | 0 refills | Status: DC

## 2019-02-06 NOTE — Telephone Encounter (Signed)
Pt is calling for the reason as to why her refill for her amphetamine-dextroamphetamine (ADDERALL XR) 15 MG 24 hr capsule  [facility] [phone]  Has yet to be filled, please call

## 2019-02-06 NOTE — Telephone Encounter (Signed)
Spoke with the patient and I was able to let her know that her medication was approved and the pharmacy should calling her soon to come pick it up. She was very appreciative for the call.

## 2019-02-22 DIAGNOSIS — M542 Cervicalgia: Secondary | ICD-10-CM | POA: Diagnosis not present

## 2019-02-22 DIAGNOSIS — M5412 Radiculopathy, cervical region: Secondary | ICD-10-CM | POA: Diagnosis not present

## 2019-02-27 DIAGNOSIS — Z20828 Contact with and (suspected) exposure to other viral communicable diseases: Secondary | ICD-10-CM | POA: Diagnosis not present

## 2019-03-27 ENCOUNTER — Other Ambulatory Visit: Payer: Self-pay | Admitting: Family Medicine

## 2019-03-27 DIAGNOSIS — M5412 Radiculopathy, cervical region: Secondary | ICD-10-CM | POA: Diagnosis not present

## 2019-03-27 DIAGNOSIS — M542 Cervicalgia: Secondary | ICD-10-CM | POA: Diagnosis not present

## 2019-03-27 MED ORDER — ALPRAZOLAM 0.25 MG PO TABS: 0.2500 mg | ORAL_TABLET | Freq: Two times a day (BID) | ORAL | 0 refills | Status: DC | PRN

## 2019-03-28 DIAGNOSIS — M25571 Pain in right ankle and joints of right foot: Secondary | ICD-10-CM | POA: Diagnosis not present

## 2019-04-04 DIAGNOSIS — M542 Cervicalgia: Secondary | ICD-10-CM | POA: Diagnosis not present

## 2019-04-04 DIAGNOSIS — M79671 Pain in right foot: Secondary | ICD-10-CM | POA: Diagnosis not present

## 2019-04-10 DIAGNOSIS — M5412 Radiculopathy, cervical region: Secondary | ICD-10-CM | POA: Diagnosis not present

## 2019-04-10 DIAGNOSIS — M5022 Other cervical disc displacement, mid-cervical region, unspecified level: Secondary | ICD-10-CM | POA: Diagnosis not present

## 2019-04-10 DIAGNOSIS — M542 Cervicalgia: Secondary | ICD-10-CM | POA: Diagnosis not present

## 2019-05-01 ENCOUNTER — Other Ambulatory Visit: Payer: Self-pay | Admitting: Adult Health

## 2019-05-01 DIAGNOSIS — F5102 Adjustment insomnia: Secondary | ICD-10-CM

## 2019-05-01 NOTE — Telephone Encounter (Signed)
Pt is needing a refill on her amphetamine-dextroamphetamine (ADDERALL XR) 15 MG 24 hr capsule sent in to the [facility] on [address]. 

## 2019-05-02 MED ORDER — AMPHETAMINE-DEXTROAMPHET ER 15 MG PO CP24: 15.0000 mg | ORAL_CAPSULE | Freq: Every day | ORAL | 0 refills | Status: DC

## 2019-05-02 NOTE — Telephone Encounter (Signed)
[place] Database Verified LR: 02-06-2019 Qty: 30 Pending appointment: 07-02-2019

## 2019-05-04 DIAGNOSIS — M542 Cervicalgia: Secondary | ICD-10-CM | POA: Diagnosis not present

## 2019-05-04 DIAGNOSIS — M5022 Other cervical disc displacement, mid-cervical region, unspecified level: Secondary | ICD-10-CM | POA: Diagnosis not present

## 2019-05-04 DIAGNOSIS — M5412 Radiculopathy, cervical region: Secondary | ICD-10-CM | POA: Diagnosis not present

## 2019-05-16 DIAGNOSIS — R519 Headache, unspecified: Secondary | ICD-10-CM | POA: Diagnosis not present

## 2019-05-16 DIAGNOSIS — R5383 Other fatigue: Secondary | ICD-10-CM | POA: Diagnosis not present

## 2019-05-16 DIAGNOSIS — Z20822 Contact with and (suspected) exposure to covid-19: Secondary | ICD-10-CM | POA: Diagnosis not present

## 2019-05-16 DIAGNOSIS — R5381 Other malaise: Secondary | ICD-10-CM | POA: Diagnosis not present

## 2019-05-16 DIAGNOSIS — J988 Other specified respiratory disorders: Secondary | ICD-10-CM | POA: Diagnosis not present

## 2019-05-16 DIAGNOSIS — H579 Unspecified disorder of eye and adnexa: Secondary | ICD-10-CM | POA: Diagnosis not present

## 2019-05-31 DIAGNOSIS — M542 Cervicalgia: Secondary | ICD-10-CM | POA: Diagnosis not present

## 2019-05-31 DIAGNOSIS — M5412 Radiculopathy, cervical region: Secondary | ICD-10-CM | POA: Diagnosis not present

## 2019-05-31 DIAGNOSIS — M5022 Other cervical disc displacement, mid-cervical region, unspecified level: Secondary | ICD-10-CM | POA: Diagnosis not present

## 2019-07-02 ENCOUNTER — Other Ambulatory Visit: Payer: Self-pay

## 2019-07-02 ENCOUNTER — Ambulatory Visit (INDEPENDENT_AMBULATORY_CARE_PROVIDER_SITE_OTHER): Payer: BC Managed Care – PPO | Admitting: Adult Health

## 2019-07-02 ENCOUNTER — Encounter: Payer: Self-pay | Admitting: Adult Health

## 2019-07-02 VITALS — BP 128/89 | HR 74 | Temp 97.9°F | Ht 58.5 in | Wt 119.4 lb

## 2019-07-02 DIAGNOSIS — R400 Somnolence: Secondary | ICD-10-CM

## 2019-07-02 DIAGNOSIS — F5102 Adjustment insomnia: Secondary | ICD-10-CM

## 2019-07-02 NOTE — Progress Notes (Addendum)
PATIENT: Susan Fisher DOB: [birth year]  REASON FOR VISIT: follow up HISTORY FROM: patient  HISTORY OF PRESENT ILLNESS: Today 07/02/19:  Susan Fisher is a 54 year old female with a history of insomnia and daytime sleepiness.  She returns today for follow-up.  She continues on Seroquel 25 mg at bedtime and Adderall ER15 mg daily.  She states that this works well.  She states that there are days that she does not take the Adderall.  She still on occasion has a night where she does not sleep very well.  She denies any new issues.  Returns today for an evaluation  HISTORY 12/27/18:  Susan Fisher is a 54 year old female with a history of insomnia and daytime sleepiness.  She returns today for follow-up.  She continues on Seroquel 25 mg at bedtime.  She states that for the most part this works well.  She was recently placed on a 12-day prednisone Dosepak.  She states this has interrupted her sleep.  She states that with the Seroquel she notes that she has a mild tremor in the left upper extremity.  This has remained stable.  She continues to take Adderall extended release 15 mg daily.  She states this continues to work well for her.  She does report there are times that she feels that she may be getting used to the dose but does not wish to make any adjustments at this time.  She returns today for evaluation.  REVIEW OF SYSTEMS: Out of a complete 14 system review of symptoms, the patient complains only of the following symptoms, and all other reviewed systems are negative.  See HPI  ALLERGIES: Allergies  Allergen Reactions  . Gluten Meal Other (See Comments)    Gluten intolerant    HOME MEDICATIONS: Outpatient Medications Prior to Visit  Medication Sig Dispense Refill  . ALPRAZolam (XANAX) 0.25 MG tablet Take 1 tablet (0.25 mg total) by mouth 2 (two) times daily as needed for anxiety. (Patient taking differently: Take 0.25 mg by mouth as needed for anxiety. ) 20 tablet 0  .  amphetamine-dextroamphetamine (ADDERALL XR) 15 MG 24 hr capsule Take 1 capsule by mouth daily. 30 capsule 0  . diclofenac (VOLTAREN) 75 MG EC tablet Take 75 mg by mouth daily.   0  . gabapentin (NEURONTIN) 300 MG capsule Take 300 mg by mouth at bedtime.    . ibandronate (BONIVA) 150 MG tablet Take 1 tablet (150 mg total) by mouth every 30 (thirty) days. Take in the morning with a full glass of water, on an empty stomach, and do not take anything else by mouth or lie down for the next 30 min. 3 tablet 3  . Polyethyl Glycol-Propyl Glycol (SYSTANE OP) Place 1 drop into both eyes 2 (two) times daily.    . QUEtiapine (SEROQUEL) 25 MG tablet Take 1 tablet (25 mg total) by mouth at bedtime. 90 tablet 2  . valACYclovir (VALTREX) 1000 MG tablet TAKE 2 TABLETS BY MOUTH TWICE A DAY FOR 1 DAY IF NEEDED FOR FEVER BLISTER OUTBREAK 8 tablet 2  . tiZANidine (ZANAFLEX) 4 MG tablet Take 2-4 mg by mouth 3 (three) times daily as needed for spasms. As needed  0   No facility-administered medications prior to visit.    PAST MEDICAL HISTORY: Past Medical History:  Diagnosis Date  . Anxiety   . Chronic insomnia   . Depression    history of  . Gluten intolerance 2011   Dr Marquette Saa  . Pneumonia  childhood  . Seasonal allergic rhinitis   . Sleep disorder    Dr Beacher May, chronic and severe insomnia  . Torn ACL (anterior cruciate ligament)    left    PAST SURGICAL HISTORY: Past Surgical History:  Procedure Laterality Date  . ANTERIOR CRUCIATE LIGAMENT REPAIR Right 08/2013   [name]  . DILATATION & CURETTAGE/HYSTEROSCOPY WITH MYOSURE N/A 04/22/2016   Procedure: [place];  Surgeon: Servando Salina, MD;  Location: [facility];  Service: Gynecology;  Laterality: N/A;  . DILATATION & CURETTAGE/HYSTEROSCOPY WITH MYOSURE N/A 08/31/2016   Procedure: [place];  Surgeon: Servando Salina, MD;  Location: [facility];  Service: Gynecology;   Laterality: N/A;  . KNEE ARTHROSCOPY Left 1995  . UPPER GASTROINTESTINAL ENDOSCOPY  2008   neg; Dr Watt Climes  . UPPER GASTROINTESTINAL ENDOSCOPY  2009    [facility]    FAMILY HISTORY: Family History  Problem Relation Age of Onset  . Anxiety disorder Mother   . Depression Mother   . Hypertension Mother   . Stroke Mother 64  . Heart failure Mother   . Heart attack Mother 3  . Breast cancer Mother 13  . COPD Mother        smoker  . Pulmonary embolism Father 63       post rotator cuff surgery  . Heart attack Paternal Grandfather 64  . Tremor Sister   . Tremor Sister     SOCIAL HISTORY: Social History   Socioeconomic History  . Marital status: Single    Spouse name: Not on file  . Number of children: 0  . Years of education: College  . Highest education level: Not on file  Occupational History    Employer: [employer]  Tobacco Use  . Smoking status: Never Smoker  . Smokeless tobacco: Never Used  Substance and Sexual Activity  . Alcohol use: Yes    Alcohol/week: 7.0 standard drinks    Types: 7 Glasses of wine per week    Comment:  socially  . Drug use: No  . Sexual activity: Not Currently  Other Topics Concern  . Not on file  Social History Narrative   Patient is single and lives alone.     Patient has a college education.   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   Patient is right-handed.      Social Determinants of Health   Financial Resource Strain:   . Difficulty of Paying Living Expenses: Not on file  Food Insecurity:   . Worried About Charity fundraiser in the Last Year: Not on file  . Ran Out of Food in the Last Year: Not on file  Transportation Needs:   . Lack of Transportation (Medical): Not on file  . Lack of Transportation (Non-Medical): Not on file  Physical Activity:   . Days of Exercise per Week: Not on file  . Minutes of  Exercise per Session: Not on file  Stress:   . Feeling of Stress : Not on file  Social Connections:   . Frequency of Communication with Friends and Family: Not on file  . Frequency of Social Gatherings with Friends and Family: Not on file  . Attends Religious Services: Not on file  . Active Member of Clubs or Organizations: Not on file  . Attends Archivist Meetings: Not on  file  . Marital Status: Not on file  Intimate Partner Violence:   . Fear of Current or Ex-Partner: Not on file  . Emotionally Abused: Not on file  . Physically Abused: Not on file  . Sexually Abused: Not on file      PHYSICAL EXAM  Vitals:   07/02/19 1051  BP: 128/89  Pulse: 74  Temp: 97.9 F (36.6 C)  TempSrc: Oral  Weight: 119 lb 6.4 oz (54.2 kg)  Height: 4' 10.5" (1.486 m)   Body mass index is 24.53 kg/m.  Generalized: Well developed, in no acute distress   Neurological examination  Mentation: Alert oriented to time, place, history taking. Follows all commands speech and language fluent Cranial nerve II-XII: Pupils were equal round reactive to light. Extraocular movements were full, visual field were full on confrontational test.  Head turning and shoulder shrug  were normal and symmetric. Motor: The motor testing reveals 5 over 5 strength of all 4 extremities. Good symmetric motor tone is noted throughout.  Sensory: Sensory testing is intact to soft touch on all 4 extremities. No evidence of extinction is noted.  Coordination: Cerebellar testing reveals good finger-nose-finger and heel-to-shin bilaterally.  Gait and station: Gait is normal.  Reflexes: Deep tendon reflexes are symmetric and normal bilaterally.   DIAGNOSTIC DATA (LABS, IMAGING, TESTING) - I reviewed patient records, labs, notes, testing and imaging myself where available.  Lab Results  Component Value Date   WBC 8.9 11/08/2018   HGB 13.5 11/08/2018   HCT 40.5 11/08/2018   MCV 95 11/08/2018   PLT CANCELED 11/08/2018       Component Value Date/Time   NA 139 11/08/2018 1445   K 4.1 11/08/2018 1445   CL 104 11/08/2018 1445   CO2 19 (L) 11/08/2018 1445   GLUCOSE 87 11/08/2018 1445   GLUCOSE 76 11/22/2016 1408   BUN 10 11/08/2018 1445   CREATININE 0.72 11/08/2018 1445   CREATININE 0.76 11/22/2016 1408   CALCIUM 8.8 11/08/2018 1445   PROT 6.0 11/08/2018 1445   ALBUMIN 4.2 11/08/2018 1445   AST 21 11/08/2018 1445   ALT 13 11/08/2018 1445   ALKPHOS 43 11/08/2018 1445   BILITOT 0.4 11/08/2018 1445   GFRNONAA 97 11/08/2018 1445   GFRAA 111 11/08/2018 1445   Lab Results  Component Value Date   CHOL 189 11/08/2018   HDL 87 11/08/2018   LDLCALC 88 11/08/2018   TRIG 71 11/08/2018   CHOLHDL 2.2 11/08/2018   No results found for: HGBA1C No results found for: VITAMINB12 Lab Results  Component Value Date   TSH 2.70 11/08/2014      ASSESSMENT AND PLAN 54 y.o. year old female  has a past medical history of Anxiety, Chronic insomnia, Depression, Gluten intolerance (2011), Pneumonia, Seasonal allergic rhinitis, Sleep disorder, and Torn ACL (anterior cruciate ligament). here with:  1.  Chronic insomnia  -Continue Seroquel 25 mg daily  2.  Daytime sleepiness  -Continue Adderall extended release 15 mg daily -Blood pressure and heart rate in normal range  Overall the patient is doing well.  Advised to continue monitor symptoms.  I spent 15 minutes of face-to-face and non-face-to-face time with patient.  This included previsit chart review,order entry, electronic health record documentation, patient education.  Ward Givens, MSN, NP-C 07/02/2019, 11:15 AM [facility] [address] [place], [place] [address] [phone]

## 2019-07-02 NOTE — Patient Instructions (Signed)
Your Plan:  Continue seroquel and Adderall If your symptoms worsen or you develop new symptoms please let us know.    Thank you for coming to see [place] at [facility]. I hope we have been able to provide you high quality care today.  You may receive a patient satisfaction survey over the next few weeks. We would appreciate your feedback and comments so that we may continue to improve ourselves and the health of our patients.

## 2019-07-10 ENCOUNTER — Other Ambulatory Visit: Payer: Self-pay | Admitting: Adult Health

## 2019-07-10 DIAGNOSIS — F5102 Adjustment insomnia: Secondary | ICD-10-CM

## 2019-07-10 MED ORDER — AMPHETAMINE-DEXTROAMPHET ER 15 MG PO CP24: 15.0000 mg | ORAL_CAPSULE | Freq: Every day | ORAL | 0 refills | Status: DC

## 2019-07-10 NOTE — Addendum Note (Signed)
Addended by: Minna Antis on: 07/10/2019 02:45 PM   Modules accepted: Orders

## 2019-07-10 NOTE — Telephone Encounter (Signed)
Phone rep checked office voicemail's, pt was called and she stated her amphetamine-dextroamphetamine (ADDERALL XR) 15 MG 24 hr capsule  [facility] [phone]  Needs calling in, please reply.

## 2019-07-24 ENCOUNTER — Other Ambulatory Visit: Payer: Self-pay | Admitting: Neurology

## 2019-07-24 DIAGNOSIS — F5102 Adjustment insomnia: Secondary | ICD-10-CM

## 2019-07-24 DIAGNOSIS — F5105 Insomnia due to other mental disorder: Secondary | ICD-10-CM

## 2019-07-24 MED ORDER — QUETIAPINE FUMARATE 25 MG PO TABS: 25.0000 mg | ORAL_TABLET | Freq: Every day | ORAL | 2 refills | Status: DC

## 2019-07-29 ENCOUNTER — Other Ambulatory Visit: Payer: Self-pay | Admitting: Physician Assistant

## 2019-08-01 DIAGNOSIS — H52203 Unspecified astigmatism, bilateral: Secondary | ICD-10-CM | POA: Diagnosis not present

## 2019-08-01 DIAGNOSIS — H40023 Open angle with borderline findings, high risk, bilateral: Secondary | ICD-10-CM | POA: Diagnosis not present

## 2019-08-01 DIAGNOSIS — H18832 Recurrent erosion of cornea, left eye: Secondary | ICD-10-CM | POA: Diagnosis not present

## 2019-08-13 ENCOUNTER — Other Ambulatory Visit: Payer: Self-pay | Admitting: Physician Assistant

## 2019-08-15 ENCOUNTER — Other Ambulatory Visit: Payer: Self-pay | Admitting: Physician Assistant

## 2019-08-16 ENCOUNTER — Other Ambulatory Visit: Payer: Self-pay | Admitting: *Deleted

## 2019-08-16 ENCOUNTER — Other Ambulatory Visit: Payer: Self-pay | Admitting: Physician Assistant

## 2019-08-16 ENCOUNTER — Ambulatory Visit: Payer: BC Managed Care – PPO | Admitting: Physician Assistant

## 2019-08-16 MED ORDER — VALACYCLOVIR HCL 1 G PO TABS: ORAL_TABLET | ORAL | 2 refills | Status: DC

## 2019-08-16 NOTE — Telephone Encounter (Signed)
Patient calling again in regards to refill of valacyclovir. She states that she texted with [facility] and was told that if she scheduled a follow up appointment in October. Vida Roller would give refills to last until then. She states that pharmacy has sent 3 more refill requests since then and they have all been denied. Patient does have follow up in October already scheduled.

## 2019-08-16 NOTE — Telephone Encounter (Signed)
Valacyclovir sent to pharmacy.

## 2019-09-10 ENCOUNTER — Other Ambulatory Visit: Payer: Self-pay | Admitting: Adult Health

## 2019-09-10 DIAGNOSIS — F5102 Adjustment insomnia: Secondary | ICD-10-CM

## 2019-09-10 NOTE — Addendum Note (Signed)
Addended by: Elita Quick on: 09/10/2019 04:32 PM   Modules accepted: Orders

## 2019-09-10 NOTE — Telephone Encounter (Signed)
Pt has called for her refill on her amphetamine-dextroamphetamine (ADDERALL XR) 15 MG 24 hr capsule  [facility] [phone]

## 2019-09-11 MED ORDER — AMPHETAMINE-DEXTROAMPHET ER 15 MG PO CP24: 15.0000 mg | ORAL_CAPSULE | Freq: Every day | ORAL | 0 refills | Status: DC

## 2019-10-17 ENCOUNTER — Other Ambulatory Visit: Payer: Self-pay | Admitting: Adult Health

## 2019-10-17 DIAGNOSIS — F5102 Adjustment insomnia: Secondary | ICD-10-CM

## 2019-10-17 MED ORDER — AMPHETAMINE-DEXTROAMPHET ER 15 MG PO CP24: 15.0000 mg | ORAL_CAPSULE | Freq: Every day | ORAL | 0 refills | Status: DC

## 2019-10-17 NOTE — Telephone Encounter (Signed)
Pt is needing a refill on her amphetamine-dextroamphetamine (ADDERALL XR) 15 MG 24 hr capsule sent in to the [facility] on [address].

## 2019-10-17 NOTE — Telephone Encounter (Signed)
Patient is up to date on her appointments. Per [place] registry, last refilled on 09/11/2019 Adderall Xr 15 Mg Capsule #30.00 for 30 day supply.

## 2019-11-14 ENCOUNTER — Other Ambulatory Visit: Payer: Self-pay | Admitting: Family Medicine

## 2019-11-14 NOTE — Telephone Encounter (Signed)
Is this okay to refill? 

## 2019-11-15 MED ORDER — ALPRAZOLAM 0.25 MG PO TABS: 0.2500 mg | ORAL_TABLET | Freq: Two times a day (BID) | ORAL | 0 refills | Status: DC | PRN

## 2019-12-14 ENCOUNTER — Other Ambulatory Visit: Payer: Self-pay | Admitting: Physician Assistant

## 2019-12-20 ENCOUNTER — Other Ambulatory Visit: Payer: Self-pay | Admitting: Physician Assistant

## 2019-12-20 MED ORDER — VALACYCLOVIR HCL 500 MG PO TABS: 500.0000 mg | ORAL_TABLET | Freq: Every day | ORAL | 2 refills | Status: DC

## 2019-12-20 NOTE — Telephone Encounter (Signed)
Patient called Susan Fisher called in wrong dose for Valcyclovir: patient normally takes 500mg  not 1000g: Kelli okayed refill because patient couldn't be seen until November.  Patient informed no more refills if she doesn't keep November office visit with Dr. Denna Haggard

## 2019-12-20 NOTE — Telephone Encounter (Signed)
Patient is calling to get dose of Valtrex that she can take daily to prevent fever blisters.  Patient uses [facility] at [facility] in [place], [place].

## 2019-12-21 ENCOUNTER — Other Ambulatory Visit: Payer: Self-pay | Admitting: Adult Health

## 2019-12-21 DIAGNOSIS — F5102 Adjustment insomnia: Secondary | ICD-10-CM

## 2019-12-21 NOTE — Telephone Encounter (Signed)
Pt request refill amphetamine-dextroamphetamine (ADDERALL XR) 15 MG 24 hr capsule at [place]

## 2019-12-24 NOTE — Addendum Note (Signed)
Addended by: Brandon Melnick on: 12/24/2019 09:16 AM   Modules accepted: Orders

## 2019-12-24 NOTE — Telephone Encounter (Signed)
Next appt 01-16-20 with MM/NP.

## 2019-12-26 NOTE — Telephone Encounter (Signed)
I called pt and relayed that MM/NP will look into this tomorrow am, I apologized as my fault as I did not route it to MM/NP.  Pt was understanding.

## 2019-12-26 NOTE — Telephone Encounter (Signed)
Pt called to see if amphetamine-dextroamphetamine (ADDERALL XR) 15 MG 24 hr capsule has been called in. Pt would like a call from the nurse.

## 2019-12-26 NOTE — Telephone Encounter (Signed)
Pt has called to see when her medication amphetamine-dextroamphetamine (ADDERALL XR) 15 MG 24 hr capsule Will be called in, please call.

## 2019-12-27 MED ORDER — AMPHETAMINE-DEXTROAMPHET ER 15 MG PO CP24: 15.0000 mg | ORAL_CAPSULE | Freq: Every day | ORAL | 0 refills | Status: DC

## 2020-01-16 ENCOUNTER — Ambulatory Visit: Payer: BC Managed Care – PPO | Admitting: Adult Health

## 2020-02-12 ENCOUNTER — Ambulatory Visit: Payer: BC Managed Care – PPO | Admitting: Physician Assistant

## 2020-02-12 ENCOUNTER — Other Ambulatory Visit: Payer: Self-pay | Admitting: Adult Health

## 2020-02-12 DIAGNOSIS — F5102 Adjustment insomnia: Secondary | ICD-10-CM

## 2020-02-12 MED ORDER — AMPHETAMINE-DEXTROAMPHET ER 15 MG PO CP24: 15.0000 mg | ORAL_CAPSULE | Freq: Every day | ORAL | 0 refills | Status: DC

## 2020-02-12 NOTE — Telephone Encounter (Signed)
Last seen 06-2019, next appt 04-23-20.

## 2020-02-12 NOTE — Telephone Encounter (Signed)
Pt called needing a refill on her amphetamine-dextroamphetamine (ADDERALL XR) 15 MG 24 hr capsule sent in to the [facility] on [address]

## 2020-02-26 ENCOUNTER — Other Ambulatory Visit: Payer: Self-pay

## 2020-02-26 ENCOUNTER — Ambulatory Visit (INDEPENDENT_AMBULATORY_CARE_PROVIDER_SITE_OTHER): Payer: PRIVATE HEALTH INSURANCE | Admitting: Dermatology

## 2020-02-26 ENCOUNTER — Encounter: Payer: Self-pay | Admitting: Dermatology

## 2020-02-26 DIAGNOSIS — B079 Viral wart, unspecified: Secondary | ICD-10-CM

## 2020-02-26 DIAGNOSIS — Z1283 Encounter for screening for malignant neoplasm of skin: Secondary | ICD-10-CM | POA: Diagnosis not present

## 2020-02-26 DIAGNOSIS — L2084 Intrinsic (allergic) eczema: Secondary | ICD-10-CM

## 2020-02-26 MED ORDER — TRIAMCINOLONE ACETONIDE 0.1 % EX CREA: 1.0000 "application " | TOPICAL_CREAM | Freq: Every day | CUTANEOUS | 2 refills | Status: DC

## 2020-02-26 NOTE — Progress Notes (Signed)
Middle left upper eye isk?  On examination 1 to 2 mm pink crust, nonspecific, return if worsens.  Follow-Up Visit   Subjective  Susan Fisher is a 54 y.o. female who presents for the following: Warts (left foot x every winter).  Possible wart Location: Foot Duration:  Quality:  Associated Signs/Symptoms: Modifying Factors:  Severity:  Timing: Context: Would like moles looked at on back.  Objective  Well appearing patient in no apparent distress; mood and affect are within normal limits.  All sun exposed areas plus back examined.  Plus feet.   Assessment & Plan    Viral warts, unspecified type Left 5th Distal Interphalangeal Joint of Toe  Per plus application of TCA with occlusive tape.  Removing or sharing in 1 to 2hours.  Call with status report in 2 weeks.  Screening exam for skin cancer (2) Mid Back; Left External Auditory Meatus  Annual skin examination  Intrinsic eczema Right Cymba  If worsens will first try over-the-counter 1% hydrocortisone ointment nightly for 2 weeks.     I, Lavonna Monarch, MD, have reviewed all documentation for this visit.  The documentation on 03/03/20 for the exam, diagnosis, procedures, and orders are all accurate and complete.

## 2020-03-03 ENCOUNTER — Encounter: Payer: Self-pay | Admitting: Dermatology

## 2020-03-03 DIAGNOSIS — Z20822 Contact with and (suspected) exposure to covid-19: Secondary | ICD-10-CM | POA: Diagnosis not present

## 2020-03-15 ENCOUNTER — Other Ambulatory Visit: Payer: Self-pay | Admitting: Physician Assistant

## 2020-03-24 ENCOUNTER — Other Ambulatory Visit: Payer: Self-pay | Admitting: Adult Health

## 2020-03-24 DIAGNOSIS — F5102 Adjustment insomnia: Secondary | ICD-10-CM

## 2020-03-24 MED ORDER — AMPHETAMINE-DEXTROAMPHET ER 15 MG PO CP24: 15.0000 mg | ORAL_CAPSULE | Freq: Every day | ORAL | 0 refills | Status: DC

## 2020-03-24 NOTE — Telephone Encounter (Signed)
Pt is requesting a refill for amphetamine-dextroamphetamine (ADDERALL XR) 15 MG 24 hr capsule. ? ?Pharmacy: [id number]  ? ?

## 2020-03-24 NOTE — Addendum Note (Signed)
Addended by: Minna Antis on: 03/24/2020 01:54 PM   Modules accepted: Orders

## 2020-04-15 ENCOUNTER — Other Ambulatory Visit: Payer: Self-pay | Admitting: *Deleted

## 2020-04-15 DIAGNOSIS — F5105 Insomnia due to other mental disorder: Secondary | ICD-10-CM

## 2020-04-15 DIAGNOSIS — F5102 Adjustment insomnia: Secondary | ICD-10-CM

## 2020-04-15 MED ORDER — QUETIAPINE FUMARATE 25 MG PO TABS: 25.0000 mg | ORAL_TABLET | Freq: Every day | ORAL | 2 refills | Status: DC

## 2020-04-23 ENCOUNTER — Ambulatory Visit: Payer: BC Managed Care – PPO | Admitting: Adult Health

## 2020-04-29 ENCOUNTER — Ambulatory Visit: Payer: BC Managed Care – PPO | Admitting: Adult Health

## 2020-04-30 ENCOUNTER — Telehealth: Payer: Self-pay | Admitting: Adult Health

## 2020-04-30 DIAGNOSIS — F5102 Adjustment insomnia: Secondary | ICD-10-CM

## 2020-04-30 MED ORDER — AMPHETAMINE-DEXTROAMPHET ER 15 MG PO CP24: 15.0000 mg | ORAL_CAPSULE | Freq: Every day | ORAL | 0 refills | Status: DC

## 2020-04-30 NOTE — Telephone Encounter (Signed)
Received Rx request for Adderall, [place] drug registry was checked and she is in compliance. Up to date on appointetments, Refill is appropriate.

## 2020-04-30 NOTE — Addendum Note (Signed)
Addended by: Enedina Finner on: 04/30/2020 04:24 PM   Modules accepted: Orders

## 2020-04-30 NOTE — Telephone Encounter (Signed)
Pt is requesting a refill for amphetamine-dextroamphetamine (ADDERALL XR) 15 MG 24 hr capsule. ? ?Pharmacy: [id number]  ? ?

## 2020-05-22 ENCOUNTER — Ambulatory Visit: Payer: BC Managed Care – PPO | Admitting: Adult Health

## 2020-06-13 ENCOUNTER — Other Ambulatory Visit: Payer: Self-pay | Admitting: Dermatology

## 2020-06-24 ENCOUNTER — Ambulatory Visit: Payer: Self-pay | Admitting: Adult Health

## 2020-07-03 LAB — RESULTS CONSOLE HPV: CHL HPV: NEGATIVE

## 2020-07-03 LAB — HM PAP SMEAR

## 2020-07-15 ENCOUNTER — Other Ambulatory Visit: Payer: Self-pay | Admitting: Adult Health

## 2020-07-15 DIAGNOSIS — F5102 Adjustment insomnia: Secondary | ICD-10-CM

## 2020-07-15 MED ORDER — AMPHETAMINE-DEXTROAMPHET ER 15 MG PO CP24: 15.0000 mg | ORAL_CAPSULE | Freq: Every day | ORAL | 0 refills | Status: DC

## 2020-07-15 NOTE — Telephone Encounter (Signed)
Pt is needing a refill on her amphetamine-dextroamphetamine (ADDERALL XR) 15 MG 24 hr capsule sent in to the [facility] off [address]

## 2020-07-21 ENCOUNTER — Other Ambulatory Visit: Payer: Self-pay | Admitting: Family Medicine

## 2020-07-22 MED ORDER — ALPRAZOLAM 0.25 MG PO TABS: 0.2500 mg | ORAL_TABLET | Freq: Two times a day (BID) | ORAL | 0 refills | Status: DC | PRN

## 2020-07-22 NOTE — Telephone Encounter (Signed)
[facility] is requesting to fill pt xanax. Sent a my chart message to advise of the need of a visit. Comfort

## 2020-09-02 ENCOUNTER — Other Ambulatory Visit: Payer: Self-pay | Admitting: Adult Health

## 2020-09-02 DIAGNOSIS — F5102 Adjustment insomnia: Secondary | ICD-10-CM

## 2020-09-02 MED ORDER — AMPHETAMINE-DEXTROAMPHET ER 15 MG PO CP24
15.0000 mg | ORAL_CAPSULE | Freq: Every day | ORAL | 0 refills | Status: DC
Start: 2020-09-02 — End: 2020-10-20

## 2020-09-02 NOTE — Telephone Encounter (Signed)
Pt request refill amphetamine-dextroamphetamine (ADDERALL XR) 15 MG 24 hr capsule at [id number]  

## 2020-09-10 ENCOUNTER — Other Ambulatory Visit: Payer: Self-pay | Admitting: Dermatology

## 2020-10-14 ENCOUNTER — Ambulatory Visit: Payer: PRIVATE HEALTH INSURANCE | Admitting: Adult Health

## 2020-10-14 ENCOUNTER — Encounter: Payer: Self-pay | Admitting: Adult Health

## 2020-10-14 ENCOUNTER — Other Ambulatory Visit: Payer: Self-pay

## 2020-10-14 VITALS — BP 141/85 | HR 84 | Ht <= 58 in | Wt 117.0 lb

## 2020-10-14 DIAGNOSIS — F5102 Adjustment insomnia: Secondary | ICD-10-CM | POA: Diagnosis not present

## 2020-10-14 NOTE — Patient Instructions (Signed)
Your Plan:  Continue seroquel Continue Adderall ER 15 mg daily      Thank you for coming to see [place] at [facility]. I hope we have been able to provide you high quality care today.  You may receive a patient satisfaction survey over the next few weeks. We would appreciate your feedback and comments so that we may continue to improve ourselves and the health of our patients.

## 2020-10-14 NOTE — Progress Notes (Signed)
PATIENT: Susan Fisher DOB: [birth year]  REASON FOR VISIT: follow up HISTORY FROM: patient  HISTORY OF PRESENT ILLNESS: Today 10/14/20:  Susan Fisher is a 55 year old female with a history of insomnia and daytime sleepiness.  She returns today for follow-up.  She states that her insomnia continues to be an issue.  She states some nights are good and other nights are bad.  Reports that she started a new job about 9 months ago.  She is working 12-hour shifts.  She states at night sometimes she stresses about falling asleep in order to get enough hours of sleep and this subsequently keeps her from falling asleep.  She remains on Seroquel 25 mg at bedtime and Adderall 15 mg extended release daily.  She returns today for an evaluation.  07/02/19: Susan Fisher is a 55 year old female with a history of insomnia and daytime sleepiness.  She returns today for follow-up.  She continues on Seroquel 25 mg at bedtime and Adderall ER15 mg daily.  She states that this works well.  She states that there are days that she does not take the Adderall.  She still on occasion has a night where she does not sleep very well.  She denies any new issues.  Returns today for an evaluation  HISTORY 12/27/18:   Susan Fisher is a 55 year old female with a history of insomnia and daytime sleepiness.  She returns today for follow-up.  She continues on Seroquel 25 mg at bedtime.  She states that for the most part this works well.  She was recently placed on a 12-day prednisone Dosepak.  She states this has interrupted her sleep.  She states that with the Seroquel she notes that she has a mild tremor in the left upper extremity.  This has remained stable.  She continues to take Adderall extended release 15 mg daily.  She states this continues to work well for her.  She does report there are times that she feels that she may be getting used to the dose but does not wish to make any adjustments at this time.  She returns today for  evaluation.  REVIEW OF SYSTEMS: Out of a complete 14 system review of symptoms, the patient complains only of the following symptoms, and all other reviewed systems are negative.  See HPI  ALLERGIES: Allergies  Allergen Reactions   Gluten Meal Other (See Comments)    Gluten intolerant    HOME MEDICATIONS: Outpatient Medications Prior to Visit  Medication Sig Dispense Refill   ALPRAZolam (XANAX) 0.25 MG tablet Take 1 tablet (0.25 mg total) by mouth 2 (two) times daily as needed for anxiety. 20 tablet 0   amphetamine-dextroamphetamine (ADDERALL XR) 15 MG 24 hr capsule Take 1 capsule by mouth daily. 30 capsule 0   diclofenac (VOLTAREN) 75 MG EC tablet Take 75 mg by mouth daily.  0   gabapentin (NEURONTIN) 300 MG capsule Take 300 mg by mouth once as needed.     Polyethyl Glycol-Propyl Glycol (SYSTANE OP) Place 1 drop into both eyes 2 (two) times daily.     QUEtiapine (SEROQUEL) 25 MG tablet Take 1 tablet (25 mg total) by mouth at bedtime. 90 tablet 2   triamcinolone cream (KENALOG) 0.1 % Apply 1 application topically daily. 80 g 2   valACYclovir (VALTREX) 500 MG tablet Take 1 tablet (500 mg total) by mouth daily. 30 tablet 1   ibandronate (BONIVA) 150 MG tablet Take 1 tablet (150 mg total) by mouth every 30 (thirty) days.  Take in the morning with a full glass of water, on an empty stomach, and do not take anything else by mouth or lie down for the next 30 min. 3 tablet 3   tiZANidine (ZANAFLEX) 4 MG tablet Take 2-4 mg by mouth 3 (three) times daily as needed for spasms. As needed (Patient not taking: Reported on 02/26/2020)  0   No facility-administered medications prior to visit.    PAST MEDICAL HISTORY: Past Medical History:  Diagnosis Date   Anxiety    Chronic insomnia    Depression    history of   Gluten intolerance 2011   Dr Marquette Saa   Pneumonia    childhood   Seasonal allergic rhinitis    Sleep disorder    Dr Beacher May, chronic and severe insomnia   Torn ACL (anterior  cruciate ligament)    left    PAST SURGICAL HISTORY: Past Surgical History:  Procedure Laterality Date   ANTERIOR CRUCIATE LIGAMENT REPAIR Right 08/2013   [name]   DILATATION & CURETTAGE/HYSTEROSCOPY WITH MYOSURE N/A 04/22/2016   Procedure: DILATATION & CURETTAGE/HYSTEROSCOPY WITH  MYOSURE;  Surgeon: Servando Salina, MD;  Location: [facility];  Service: Gynecology;  Laterality: N/A;   DILATATION & CURETTAGE/HYSTEROSCOPY WITH MYOSURE N/A 08/31/2016   Procedure: [place];  Surgeon: Servando Salina, MD;  Location: [facility];  Service: Gynecology;  Laterality: N/A;   KNEE ARTHROSCOPY Left 1995   UPPER GASTROINTESTINAL ENDOSCOPY  2008   neg; Dr Watt Climes   UPPER GASTROINTESTINAL ENDOSCOPY  2009    [facility]    FAMILY HISTORY: Family History  Problem Relation Age of Onset   Anxiety disorder Mother    Depression Mother    Hypertension Mother    Stroke Mother 59   Heart failure Mother    Heart attack Mother 18   Breast cancer Mother 57   COPD Mother        smoker   Pulmonary embolism Father 79       post rotator cuff surgery   Heart attack Paternal Grandfather 70   Tremor Sister    Tremor Sister     SOCIAL HISTORY: Social History   Socioeconomic History   Marital status: Single    Spouse name: Not on file   Number of children: 0   Years of education: College   Highest education level: Not on file  Occupational History    Employer: [employer]  Tobacco Use   Smoking status: Never   Smokeless tobacco: Never  Vaping Use   Vaping Use: Never used  Substance and Sexual Activity   Alcohol use: Yes    Alcohol/week: 7.0 standard drinks    Types: 7 Glasses of wine per week    Comment:  socially   Drug use: No   Sexual activity: Not Currently  Other Topics Concern   Not on file  Social History Narrative   Patient is single and lives alone.     Patient has a college education.   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 -     planning to enroll with PT asst school at [employer] fall 2016   Patient drinks two caffeine drinks daily.   Patient is right-handed.      Social Determinants of Health   Financial Resource Strain: Not on file  Food Insecurity: Not on file  Transportation Needs: Not on file  Physical Activity: Not on file  Stress: Not on file  Social Connections: Not on file  Intimate Partner  Violence: Not on file      PHYSICAL EXAM  Vitals:   10/14/20 0820  BP: (!) 141/85  Pulse: 84  Weight: 117 lb (53.1 kg)  Height: 4\' 10"  (1.473 m)   Body mass index is 24.45 kg/m.  Generalized: Well developed, in no acute distress   Neurological examination  Mentation: Alert oriented to time, place, history taking. Follows all commands speech and language fluent Cranial nerve II-XII: Pupils were equal round reactive to light. Extraocular movements were full, visual field were full on confrontational test.  Head turning and shoulder shrug  were normal and symmetric. Motor: The motor testing reveals 5 over 5 strength of all 4 extremities. Good symmetric motor tone is noted throughout.  Sensory: Sensory testing is intact to soft touch on all 4 extremities. No evidence of extinction is noted.  Coordination: Cerebellar testing reveals good finger-nose-finger and heel-to-shin bilaterally.  Gait and station: Gait is normal.  Reflexes: Deep tendon reflexes are symmetric and normal bilaterally.   DIAGNOSTIC DATA (LABS, IMAGING, TESTING) - I reviewed patient records, labs, notes, testing and imaging myself where available.  Lab Results  Component Value Date   WBC 8.9 11/08/2018   HGB 13.5 11/08/2018   HCT 40.5 11/08/2018   MCV 95 11/08/2018   PLT CANCELED 11/08/2018      Component Value Date/Time   NA 139 11/08/2018 1445   K 4.1 11/08/2018 1445   CL 104 11/08/2018 1445   CO2 19 (L) 11/08/2018 1445   GLUCOSE 87 11/08/2018 1445   GLUCOSE 76 11/22/2016 1408   BUN 10 11/08/2018 1445   CREATININE 0.72  11/08/2018 1445   CREATININE 0.76 11/22/2016 1408   CALCIUM 8.8 11/08/2018 1445   PROT 6.0 11/08/2018 1445   ALBUMIN 4.2 11/08/2018 1445   AST 21 11/08/2018 1445   ALT 13 11/08/2018 1445   ALKPHOS 43 11/08/2018 1445   BILITOT 0.4 11/08/2018 1445   GFRNONAA 97 11/08/2018 1445   GFRAA 111 11/08/2018 1445   Lab Results  Component Value Date   CHOL 189 11/08/2018   HDL 87 11/08/2018   LDLCALC 88 11/08/2018   TRIG 71 11/08/2018   CHOLHDL 2.2 11/08/2018   No results found for: HGBA1C No results found for: VITAMINB12 Lab Results  Component Value Date   TSH 2.70 11/08/2014      ASSESSMENT AND PLAN 55 y.o. year old female  has a past medical history of Anxiety, Chronic insomnia, Depression, Gluten intolerance (2011), Pneumonia, Seasonal allergic rhinitis, Sleep disorder, and Torn ACL (anterior cruciate ligament). here with:  1.  Chronic insomnia  -Continue Seroquel 25 mg daily -Discussed good sleep hygiene  2.  Daytime sleepiness  -Continue Adderall extended release 15 mg daily -Blood pressure and heart rate in normal range  Follow-up in 6 months or sooner if needed  Ward Givens, MSN, NP-C 10/14/2020, 8:41 AM [facility] [address] [place], [place] [address] [phone]

## 2020-10-20 ENCOUNTER — Other Ambulatory Visit: Payer: Self-pay | Admitting: Adult Health

## 2020-10-20 DIAGNOSIS — F5102 Adjustment insomnia: Secondary | ICD-10-CM

## 2020-10-20 NOTE — Telephone Encounter (Signed)
Pt called stating she needed a refill of amphetamine-dextroamphetamine (ADDERALL XR) 15 MG 24 hr capsule sent to [facility] [phone], please advise.

## 2020-10-21 MED ORDER — AMPHETAMINE-DEXTROAMPHET ER 15 MG PO CP24
15.0000 mg | ORAL_CAPSULE | Freq: Every day | ORAL | 0 refills | Status: DC
Start: 2020-10-21 — End: 2021-02-10

## 2020-10-21 NOTE — Telephone Encounter (Signed)
approved

## 2020-11-17 ENCOUNTER — Other Ambulatory Visit: Payer: Self-pay | Admitting: Dermatology

## 2020-12-09 MED ORDER — ALPRAZOLAM 0.25 MG PO TABS: 0.2500 mg | ORAL_TABLET | Freq: Two times a day (BID) | ORAL | 0 refills | Status: DC | PRN

## 2021-01-19 ENCOUNTER — Other Ambulatory Visit: Payer: Self-pay | Admitting: Dermatology

## 2021-01-19 ENCOUNTER — Telehealth: Payer: Self-pay | Admitting: Physician Assistant

## 2021-01-19 MED ORDER — VALACYCLOVIR HCL 500 MG PO TABS: 500.0000 mg | ORAL_TABLET | Freq: Every day | ORAL | 1 refills | Status: DC

## 2021-01-19 NOTE — Telephone Encounter (Signed)
Patient calling because she had pharmacy send refill request for valacyclovir and it was denied. I scheduled patient for appointment on 1/19 with KRS. She was last seen 02/2020. She would like to know if Vida Roller will send refill of this medication? If so she would like it sent to  [facility]. She would like a call back lettering her know the outcome either way.

## 2021-01-19 NOTE — Telephone Encounter (Signed)
Phone call to patient to inform her that a refill has been sent to her Pharmacy.  Patient aware.

## 2021-02-10 ENCOUNTER — Other Ambulatory Visit: Payer: Self-pay | Admitting: Adult Health

## 2021-02-10 DIAGNOSIS — F5102 Adjustment insomnia: Secondary | ICD-10-CM

## 2021-02-10 DIAGNOSIS — F5105 Insomnia due to other mental disorder: Secondary | ICD-10-CM

## 2021-02-10 MED ORDER — AMPHETAMINE-DEXTROAMPHET ER 15 MG PO CP24: 15.0000 mg | ORAL_CAPSULE | Freq: Every day | ORAL | 0 refills | Status: DC

## 2021-02-10 NOTE — Telephone Encounter (Signed)
Pt called requesting a refill on her amphetamine-dextroamphetamine (ADDERALL XR) 15 MG 24 hr capsule which needs to be sent to the [facility] on [address] and she is also needing a refill on her QUEtiapine (SEROQUEL) 25 MG tablet which needs to be sent to [facility] in [place]

## 2021-02-10 NOTE — Telephone Encounter (Signed)
Patient is due for a refill on adderall. Patient is up to date on her appointments. [place] Controlled Substance Registry checked and is appropriate.  

## 2021-02-23 ENCOUNTER — Other Ambulatory Visit: Payer: Self-pay | Admitting: Family Medicine

## 2021-02-24 MED ORDER — ALPRAZOLAM 0.25 MG PO TABS: 0.2500 mg | ORAL_TABLET | Freq: Two times a day (BID) | ORAL | 0 refills | Status: DC | PRN

## 2021-02-24 NOTE — Telephone Encounter (Signed)
Pt is requesting to have [facility] into [facility]. Please advise [facility]

## 2021-02-26 ENCOUNTER — Telehealth: Payer: Self-pay | Admitting: Adult Health

## 2021-02-26 NOTE — Telephone Encounter (Signed)
error 

## 2021-03-24 ENCOUNTER — Other Ambulatory Visit: Payer: Self-pay | Admitting: Physician Assistant

## 2021-04-06 ENCOUNTER — Other Ambulatory Visit: Payer: Self-pay | Admitting: Adult Health

## 2021-04-06 DIAGNOSIS — F5102 Adjustment insomnia: Secondary | ICD-10-CM

## 2021-04-06 NOTE — Telephone Encounter (Signed)
Pt request refill for amphetamine-dextroamphetamine (ADDERALL XR) 15 MG 24 hr capsule at [id number]  

## 2021-04-06 NOTE — Telephone Encounter (Signed)
02/10/2021 Adderall Xr 15 Mg Capsule #30/30. Pt up to date on results. Refill request sent to [facility] NP.

## 2021-04-07 MED ORDER — AMPHETAMINE-DEXTROAMPHET ER 15 MG PO CP24: 15.0000 mg | ORAL_CAPSULE | Freq: Every day | ORAL | 0 refills | Status: DC

## 2021-04-15 ENCOUNTER — Ambulatory Visit: Payer: PRIVATE HEALTH INSURANCE | Admitting: Neurology

## 2021-04-15 ENCOUNTER — Other Ambulatory Visit: Payer: Self-pay

## 2021-04-15 ENCOUNTER — Encounter: Payer: Self-pay | Admitting: Neurology

## 2021-04-15 VITALS — BP 149/86 | HR 73 | Ht <= 58 in | Wt 122.0 lb

## 2021-04-15 DIAGNOSIS — F5105 Insomnia due to other mental disorder: Secondary | ICD-10-CM | POA: Diagnosis not present

## 2021-04-15 DIAGNOSIS — G250 Essential tremor: Secondary | ICD-10-CM | POA: Diagnosis not present

## 2021-04-15 DIAGNOSIS — F40242 Fear of bridges: Secondary | ICD-10-CM

## 2021-04-15 DIAGNOSIS — F5102 Adjustment insomnia: Secondary | ICD-10-CM

## 2021-04-15 MED ORDER — QUETIAPINE FUMARATE 25 MG PO TABS: ORAL_TABLET | ORAL | 2 refills | Status: DC

## 2021-04-15 NOTE — Patient Instructions (Signed)
CALA device ordered.

## 2021-04-15 NOTE — Progress Notes (Signed)
PATIENT: Susan Fisher DOB: 1965-04-29  REASON FOR VISIT: follow up HISTORY FROM: patient  HISTORY OF PRESENT ILLNESS: 04/15/21:   04-15-2021:  Rv for Susan Fisher. Susan Fisher, 55 year old caucasian female with chronic insomnia, tremor, and a history of dry eyes, increased eye pressure (glaucoma? )  and she had 2 eye surgeries. Meibomian gland disease.  Nondominant hand tremor has been increasing, she would be interested in the Susan Fisher device device which is a stimulator at the wrist which is a stimulator at the wrist appearing like a watch- I am happy to prescribe.  The form she brought me has no prescription for me to sign.  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 Noted more tremor in the left over right, in a right hand dominant female with history of essential familiar tremors as well as being on SEROquel for insomnia. This medication also can cause tremor. She is only 25 mg a day ( bedtime ) . She is still using adderall 15 XR.    Susan Fisher is a 55 year old female with a history of insomnia and daytime sleepiness.  She returns today for follow-up.  She states that her insomnia continues to be an issue.  She states some nights are good and other nights are bad.  Reports that she started a Susan job about 9 months ago.  She is working 12-hour shifts.  She states at night sometimes she stresses about falling asleep in order to get enough hours of sleep and this subsequently keeps her from falling asleep.  She remains on Seroquel 25 mg at bedtime and Adderall 15 mg extended release daily.  She returns today for an evaluation.  07/02/19: Susan Fisher is a 55 year old female with a history of insomnia and daytime sleepiness.  She returns today for follow-up.  She continues on Seroquel 25 mg at bedtime and Adderall ER15 mg daily.  She states that this works well.  She states that there are days that she does  not take the Adderall.  She still on occasion has a night where she does not sleep very well.  She denies any Susan issues.  Returns today for an evaluation  HISTORY 12/27/18:   Susan Fisher is a 55 year old female with a history of insomnia and daytime sleepiness.  She returns today for follow-up.  She continues on Seroquel 25 mg at bedtime.  She states that for the most part this works well.  She was recently placed on a 12-day prednisone Dosepak.  She states this has interrupted her sleep.  She states that with the Seroquel she notes that she has a mild tremor in the left upper extremity.  This has remained stable.  She continues to take Adderall extended release 15 mg daily.  She states this continues to work well for her.  She does report there are times that she feels that she Fisher be getting used to the dose but does not wish to make any adjustments at this time.  She returns today for evaluation.  REVIEW OF SYSTEMS: Out of a complete 14 system review of symptoms, the patient complains only of the following symptoms, and all other reviewed systems are negative. 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 Noted more tremor in the left over right, in a right hand dominant female with history of essential familiar tremors as well  as being on SEROquel for insomnia. This medication also can cause tremor.   No flowsheet data found. 04-15-2021  How likely are you to doze in the following situations: 0 = not likely, 1 = slight chance, 2 = moderate chance, 3 = high chance  Sitting and Reading? Watching Television? Sitting inactive in a public place (theater or meeting)? Lying down in the afternoon when circumstances permit? Sitting and talking to someone? Sitting quietly after lunch without alcohol? In a car, while stopped for a few minutes in traffic? As a passenger in a car for an hour without a break?  Total = 9 FSS at 48/63 points.   Depression PQR:     ALLERGIES: Allergies  Allergen Reactions   Gluten Meal Other (See Comments)    Gluten intolerant    HOME MEDICATIONS: Outpatient Medications Prior to Visit  Medication Sig Dispense Refill   ALPRAZolam (XANAX) 0.25 MG tablet Take 1 tablet (0.25 mg total) by mouth 2 (two) times daily as needed for anxiety. 20 tablet 0   amphetamine-dextroamphetamine (ADDERALL XR) 15 MG 24 hr capsule Take 1 capsule by mouth daily. 30 capsule 0   diclofenac (VOLTAREN) 75 MG EC tablet Take 75 mg by mouth daily.  0   Polyethyl Glycol-Propyl Glycol (SYSTANE OP) Place 1 drop into both eyes 2 (two) times daily.     QUEtiapine (SEROQUEL) 25 MG tablet Take 1 tablet (25 mg total) by mouth at bedtime. 90 tablet 2   triamcinolone cream (KENALOG) 0.1 % Apply 1 application topically daily. 80 g 2   valACYclovir (VALTREX) 500 MG tablet Take 1 tablet (500 mg total) by mouth daily. 30 tablet 1   gabapentin (NEURONTIN) 300 MG capsule Take 300 mg by mouth once as needed.     No facility-administered medications prior to visit.    PAST MEDICAL HISTORY: Past Medical History:  Diagnosis Date   Anxiety    Chronic insomnia    Depression    history of   Gluten intolerance 2011   Susan Fisher   Pneumonia    childhood   Seasonal allergic rhinitis    Sleep disorder    Susan Fisher, chronic and severe insomnia   Torn ACL (anterior cruciate ligament)    left    PAST SURGICAL HISTORY: Past Surgical History:  Procedure Laterality Date   ANTERIOR CRUCIATE LIGAMENT REPAIR Right 08/2013   Susan Fisher   DILATATION & CURETTAGE/HYSTEROSCOPY WITH MYOSURE N/A 04/22/2016   Procedure: DILATATION & CURETTAGE/HYSTEROSCOPY WITH  MYOSURE;  Surgeon: Susan Salina, MD;  Location: [facility];  Service: Gynecology;  Laterality: N/A;   DILATATION & CURETTAGE/HYSTEROSCOPY WITH MYOSURE N/A 08/31/2016   Procedure: [place];  Surgeon: Susan Salina, MD;  Location: [facility];  Service: Gynecology;   Laterality: N/A;   KNEE ARTHROSCOPY Left 1995   UPPER GASTROINTESTINAL ENDOSCOPY  2008   neg; Susan Fisher   UPPER GASTR[facility]INTESTINAL ENDOSCOPY  2009    [facility]    FAMILY HISTORY: Family History  Problem Relation Age of Onset   Anxiety disorder Mother    Depression Mother    Hypertension Mother    Stroke Mother 45   Heart failure Mother    Heart attack Mother 12   Breast cancer Mother 70   COPD Mother        smoker   Pulmonary embolism Father 67       post rotator cuff surgery   Heart attack Paternal Grandfather 67   Tremor Sister    Tremor  Sister     SOCIAL HISTORY: Social History   Socioeconomic History   Marital status: Single    Spouse name: Not on file   Number of children: 0   Years of education: College   Highest education level: Not on file  Occupational History    Employer: [employer]  Tobacco Use   Smoking status: Never   Smokeless tobacco: Never  Vaping Use   Vaping Use: Never used  Substance and Sexual Activity   Alcohol use: Yes    Alcohol/week: 7.0 standard drinks    Types: 7 Glasses of wine per week    Comment:  socially   Drug use: No   Sexual activity: Not Currently  Other Topics Concern   Not on file  Social History Narrative   Patient is single and lives alone.     Patient has a college education.   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 -    planning to enroll with PT asst school at [employer] fall 2016   Patient drinks two caffeine drinks daily.   Patient is right-handed.      Social Determinants of Health   Financial Resource Strain: Not on file  Food Insecurity: Not on file  Transportation Needs: Not on file  Physical Activity: Not on file  Stress: Not on file  Social Connections: Not on file  Intimate Partner Violence: Not on file      PHYSICAL EXAM  Vitals:   04/15/21 0818  BP: (!) 149/86  Pulse: 73  Weight: 122 lb (55.3 kg)  Height: 4\' 10"  (1.473 m)   Body mass index is 25.5 kg/m. Neck size 12.25"    Mallampati 1-  Generalized: Well developed, in no acute distress. A little anxious.    Neurological examination  Mentation: Alert oriented to time, place, history taking. Follows all commands speech and language fluent Cranial nerve ; no change in taste or smell.  Pupils were equal round reactive to light. Remarkable: long lashes.   Extraocular movements were full, visual field were full on confrontational test. Halo vision changes.  Facial sensation and strength were normal. Uvula  and tongue in midline. Mallampati 1- Head turning and shoulder shrug  were full ROM, normal and symmetric. Motor: full 5/  5 strength of all 4 extremities with symmetric motor tone throughout. No cogwheeling.  Sensory: no extinction , loss of primary modalities.  Coordination: bilateral finger-nose-finger . Gait and station: Gait intact . Tandem gait :No drift is seen.  Reflexes: Deep tendon reflexes are symmetric without clonus bilaterally.     DIAGNOSTIC DATA (LABS, IMAGING, TESTING) - I reviewed patient records, labs, notes, testing and imaging myself where available.  Lab Results  Component Value Date   WBC 8.9 11/08/2018   HGB 13.5 11/08/2018   HCT 40.5 11/08/2018   MCV 95 11/08/2018   PLT CANCELED 11/08/2018      Component Value Date/Time   NA 139 11/08/2018 1445   K 4.1 11/08/2018 1445   CL 104 11/08/2018 1445   CO2 19 (L) 11/08/2018 1445   GLUCOSE 87 11/08/2018 1445   GLUCOSE 76 11/22/2016 1408   BUN 10 11/08/2018 1445   CREATININE 0.72 11/08/2018 1445   CREATININE 0.76 11/22/2016 1408   CALCIUM 8.8 11/08/2018 1445   PROT 6.0 11/08/2018 1445   ALBUMIN 4.2 11/08/2018 1445   AST 21 11/08/2018 1445   ALT 13 11/08/2018 1445   ALKPHOS 43 11/08/2018 1445   BILITOT 0.4 11/08/2018 1445   GFRNONAA  97 11/08/2018 1445   GFRAA 111 11/08/2018 1445   Lab Results  Component Value Date   CHOL 189 11/08/2018   HDL 87 11/08/2018   LDLCALC 88 11/08/2018   TRIG 71 11/08/2018   CHOLHDL 2.2  11/08/2018   No results found for: HGBA1C No results found for: VITAMINB12 Lab Results  Component Value Date   TSH 2.70 11/08/2014      ASSESSMENT AND PLAN 55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 here with:  1.  Chronic insomnia now with snoring , extremely light sleeper.  -Continue Seroquel 25 mg daily, can increase tremor. I offered a prn use of 1/2 tab, to be cut with a pill cutter.  45 tabs a month.  -Discussed good sleep hygiene, ordered a HST ( she has never been undergoing testing.)  2.  Daytime sleepiness  -Continue Adderall extended release 15 mg daily, can increase tremor -Blood pressure and heart rate in normal range  3. Essential tremor -  I wrote a script for CALA device.   Follow-up in 6 months or sooner if needed, and with NP   04/15/2021, 8:47 AM [facility] [address] [place], [place] [address] [phone]

## 2021-04-22 ENCOUNTER — Other Ambulatory Visit: Payer: Self-pay | Admitting: Family Medicine

## 2021-04-22 MED ORDER — ALPRAZOLAM 0.25 MG PO TABS: 0.2500 mg | ORAL_TABLET | Freq: Two times a day (BID) | ORAL | 0 refills | Status: DC | PRN

## 2021-04-22 NOTE — Telephone Encounter (Signed)
Refill request

## 2021-05-14 ENCOUNTER — Other Ambulatory Visit: Payer: Self-pay

## 2021-05-14 ENCOUNTER — Encounter: Payer: Self-pay | Admitting: Physician Assistant

## 2021-05-14 ENCOUNTER — Ambulatory Visit (INDEPENDENT_AMBULATORY_CARE_PROVIDER_SITE_OTHER): Payer: PRIVATE HEALTH INSURANCE | Admitting: Physician Assistant

## 2021-05-14 DIAGNOSIS — L219 Seborrheic dermatitis, unspecified: Secondary | ICD-10-CM

## 2021-05-14 DIAGNOSIS — B001 Herpesviral vesicular dermatitis: Secondary | ICD-10-CM | POA: Diagnosis not present

## 2021-05-14 DIAGNOSIS — L570 Actinic keratosis: Secondary | ICD-10-CM | POA: Diagnosis not present

## 2021-05-14 DIAGNOSIS — Z1283 Encounter for screening for malignant neoplasm of skin: Secondary | ICD-10-CM

## 2021-05-14 MED ORDER — BETAMETHASONE DIPROPIONATE 0.05 % EX CREA
TOPICAL_CREAM | CUTANEOUS | 2 refills | Status: AC
Start: 2021-05-14 — End: ?

## 2021-05-14 MED ORDER — VALACYCLOVIR HCL 1 G PO TABS
1000.0000 mg | ORAL_TABLET | Freq: Every day | ORAL | 4 refills | Status: DC
Start: 2021-05-14 — End: 2021-11-24

## 2021-05-14 NOTE — Patient Instructions (Addendum)
Pick up over the counter clotrimazole mix with triamcinolone and apply to ears nightly    [facility]

## 2021-05-19 ENCOUNTER — Encounter: Payer: Self-pay | Admitting: Physician Assistant

## 2021-05-19 NOTE — Progress Notes (Signed)
° °  Follow-Up Visit   Subjective  Susan Fisher is a 56 y.o. female who presents for the following: Annual Exam (Lesion on lips (fever blister) x years small dose of valtrex isn't helping. Lesion on left eye lid x year no itching or bleeding. Lesion under right eye x year no itching or bleeding. Lesion on left jaw line x years little itching no bleeding. Lesion on left hand x 2 years itching no bleeding. Lesion on left bicep x 2 months itching no bleeding. Lesion left thigh x 4 years itching no bleeding. Lesion on stomach x 6 months, no itching or bleeding. Lesion on both pinky toes x 10 years, no itching or bleed).   The following portions of the chart were reviewed this encounter and updated as appropriate:  Tobacco   Allergies   Meds   Problems   Med Hx   Surg Hx   Fam Hx       Objective  Well appearing patient in no apparent distress; mood and affect are within normal limits.  A full examination was performed including scalp, head, eyes, ears, nose, lips, neck, chest, axillae, abdomen, back, buttocks, bilateral upper extremities, bilateral lower extremities, hands, feet, fingers, toes, fingernails, and toenails. All findings within normal limits unless otherwise noted below.  Right Ear Significant scale on red base.   Chest - Medial [facility]), Left arm (3), Right Forearm - Posterior, Right Nasal Sidewall Erythematous patches with gritty scale.  Mid Upper Vermilion Lip Clear today.   Assessment & Plan  Seborrheic dermatitis Right Ear  Pick up over the counter clotrimazole and mix with betamethasone   betamethasone dipropionate 0.05 % cream - Right Ear Apply to ears nightly  AK (actinic keratosis) (6) Left arm (3); Right Forearm - Posterior; Chest - Medial [facility]); Right Nasal Sidewall  Destruction of lesion - Chest - Medial [facility]), Left arm, Right Forearm - Posterior, Right Nasal Sidewall Complexity: simple   Destruction method: cryotherapy   Informed consent: discussed  and consent obtained   Timeout:  patient name, date of birth, surgical site, and procedure verified Lesion destroyed using liquid nitrogen: Yes   Cryotherapy cycles:  1 Outcome: patient tolerated procedure well with no complications   Post-procedure details: wound care instructions given    Fever blister Mid Upper Vermilion Lip  valACYclovir (VALTREX) 1000 MG tablet - Mid Upper Vermilion Lip Take 1 tablet (1,000 mg total) by mouth daily.    I, Stanly Si, PA-C, have reviewed all documentation's for this visit.  The documentation on 05/19/21 for the exam, diagnosis, procedures and orders are all accurate and complete.

## 2021-05-22 ENCOUNTER — Encounter: Payer: Self-pay | Admitting: Family Medicine

## 2021-05-22 ENCOUNTER — Other Ambulatory Visit: Payer: Self-pay

## 2021-05-22 ENCOUNTER — Ambulatory Visit: Payer: PRIVATE HEALTH INSURANCE | Admitting: Family Medicine

## 2021-05-22 VITALS — BP 128/80 | HR 74 | Temp 98.1°F | Ht 58.5 in | Wt 122.2 lb

## 2021-05-22 DIAGNOSIS — G479 Sleep disorder, unspecified: Secondary | ICD-10-CM

## 2021-05-22 DIAGNOSIS — Z0000 Encounter for general adult medical examination without abnormal findings: Secondary | ICD-10-CM

## 2021-05-22 DIAGNOSIS — J302 Other seasonal allergic rhinitis: Secondary | ICD-10-CM

## 2021-05-22 DIAGNOSIS — Z1159 Encounter for screening for other viral diseases: Secondary | ICD-10-CM

## 2021-05-22 DIAGNOSIS — K9041 Non-celiac gluten sensitivity: Secondary | ICD-10-CM

## 2021-05-22 DIAGNOSIS — M810 Age-related osteoporosis without current pathological fracture: Secondary | ICD-10-CM

## 2021-05-22 DIAGNOSIS — Z1211 Encounter for screening for malignant neoplasm of colon: Secondary | ICD-10-CM

## 2021-05-22 DIAGNOSIS — F411 Generalized anxiety disorder: Secondary | ICD-10-CM

## 2021-05-22 DIAGNOSIS — M1990 Unspecified osteoarthritis, unspecified site: Secondary | ICD-10-CM

## 2021-05-22 DIAGNOSIS — F5102 Adjustment insomnia: Secondary | ICD-10-CM

## 2021-05-22 DIAGNOSIS — G250 Essential tremor: Secondary | ICD-10-CM

## 2021-05-22 MED ORDER — IBANDRONATE SODIUM 150 MG PO TABS: 150.0000 mg | ORAL_TABLET | ORAL | 3 refills | Status: DC

## 2021-05-22 NOTE — Progress Notes (Signed)
° °  Subjective:    Patient ID: Susan Fisher, female    DOB: 12-03-1965, 56 y.o.   MRN: [id number]  HPI She is here for complete examination.  She does have a previous history of osteopenia but did have a stress fracture and was given Boniva however she stopped taking it citing fears of possible hip fracture.  She has not been on this in quite some time.  She would like to start back on it.  She also has had difficulty with sleep disturbance and has seen Dr. Brett Fairy for this.  She is in the process of having a sleep study done.  Presently she is on Seroquel for this but not having much luck.  She is also been given Adderall to help during the day to help with her energy.  Dr. Brett Fairy is handling that.  She does have some difficulty with anxiety and has used Xanax in the past but seems to be handling this very well, using the Xanax appropriately.  She does have a slight tremor and gluten insensitivity but seems to be handling this well.  She does have x-ray evidence of arthritis but really not having any symptoms at the present time.  She does plan to follow-up with Pap and pelvic from Dr. Garwin Brothers.  Her allergies seem to be under good control.  She has had colon cancer screening done.   Review of Systems  All other systems reviewed and are negative.     Objective:   Physical Exam Alert and in no distress. Tympanic membranes and canals are normal. Pharyngeal area is normal. Neck is supple without adenopathy or thyromegaly. Cardiac exam shows a regular sinus rhythm without murmurs or gallops. Lungs are clear to auscultation.        Assessment & Plan:  Routine general medical examination at a health care facility - Plan: CBC with Differential/Platelet, Comprehensive metabolic panel, Lipid panel  Disturbance in sleep behavior  Anxiety state  Seasonal allergies  Benign essential tremor  Gluten intolerance  Insomnia due to stress  Arthritis  Osteoporosis without current pathological  fracture, unspecified osteoporosis type - DEXA shows osteopenia but has had a stress fracture. - Plan: CBC with Differential/Platelet, Comprehensive metabolic panel, ibandronate (BONIVA) 150 MG tablet  Need for hepatitis C screening test - Plan: Hepatitis C antibody  Screening for colon cancer - Plan: Cologuard She will continue to be followed by Dr. Brett Fairy for her sleep disturbance.  If she runs out of Xanax I have no problem with that.  Did explain that the Xanax is really not to be used with sleep med at night as there can be some issues with interfering with her sleep patterns. She will be placed back on Boniva.  Instructed her on proper use of this.

## 2021-05-23 LAB — LIPID PANEL
Chol/HDL Ratio: 2.4 ratio (ref 0.0–4.4)
Cholesterol, Total: 186 mg/dL (ref 100–199)
HDL: 78 mg/dL (ref >39)
LDL Chol Calc (NIH): 95 mg/dL (ref 0–99)
Triglycerides: 72 mg/dL (ref 0–149)
VLDL Cholesterol Cal: 13 mg/dL (ref 5–40)

## 2021-05-23 LAB — CBC WITH DIFFERENTIAL/PLATELET
Basophils Absolute: 0.1 10*3/uL (ref 0.0–0.2)
Basos: 1 %
EOS (ABSOLUTE): 0.1 10*3/uL (ref 0.0–0.4)
Eos: 2 %
Hematocrit: 45.9 % (ref 34.0–46.6)
Hemoglobin: 15.1 g/dL (ref 11.1–15.9)
Immature Grans (Abs): 0 10*3/uL (ref 0.0–0.1)
Immature Granulocytes: 0 %
Lymphocytes Absolute: 1.8 10*3/uL (ref 0.7–3.1)
Lymphs: 32 %
MCH: 30.6 pg (ref 26.6–33.0)
MCHC: 32.9 g/dL (ref 31.5–35.7)
MCV: 93 fL (ref 79–97)
Monocytes Absolute: 0.5 10*3/uL (ref 0.1–0.9)
Monocytes: 8 %
Neutrophils Absolute: 3.2 10*3/uL (ref 1.4–7.0)
Neutrophils: 57 %
Platelets: 165 10*3/uL (ref 150–450)
RBC: 4.94 x10E6/uL (ref 3.77–5.28)
RDW: 12.3 % (ref 11.7–15.4)
WBC: 5.6 10*3/uL (ref 3.4–10.8)

## 2021-05-23 LAB — COMPREHENSIVE METABOLIC PANEL
ALT: 24 IU/L (ref 0–32)
AST: 21 IU/L (ref 0–40)
Albumin/Globulin Ratio: 1.9 (ref 1.2–2.2)
Albumin: 4.4 g/dL (ref 3.8–4.9)
Alkaline Phosphatase: 75 IU/L (ref 44–121)
BUN/Creatinine Ratio: 17 (ref 9–23)
BUN: 13 mg/dL (ref 6–24)
Bilirubin Total: 0.4 mg/dL (ref 0.0–1.2)
CO2: 24 mmol/L (ref 20–29)
Calcium: 9.4 mg/dL (ref 8.7–10.2)
Chloride: 103 mmol/L (ref 96–106)
Creatinine, Ser: 0.75 mg/dL (ref 0.57–1.00)
Globulin, Total: 2.3 g/dL (ref 1.5–4.5)
Glucose: 83 mg/dL (ref 70–99)
Potassium: 4.8 mmol/L (ref 3.5–5.2)
Sodium: 140 mmol/L (ref 134–144)
Total Protein: 6.7 g/dL (ref 6.0–8.5)
eGFR: 95 mL/min/{1.73_m2} (ref >59)

## 2021-05-23 LAB — HEPATITIS C ANTIBODY: Hep C Virus Ab: <0.1 s/co ratio (ref 0.0–0.9)

## 2021-06-05 ENCOUNTER — Ambulatory Visit (INDEPENDENT_AMBULATORY_CARE_PROVIDER_SITE_OTHER): Payer: PRIVATE HEALTH INSURANCE | Admitting: Podiatry

## 2021-06-05 ENCOUNTER — Other Ambulatory Visit: Payer: Self-pay

## 2021-06-05 DIAGNOSIS — L84 Corns and callosities: Secondary | ICD-10-CM | POA: Diagnosis not present

## 2021-06-05 DIAGNOSIS — M205X9 Other deformities of toe(s) (acquired), unspecified foot: Secondary | ICD-10-CM

## 2021-06-09 NOTE — Progress Notes (Signed)
Subjective:   Patient ID: Susan Fisher, female   DOB: 56 y.o.   MRN: [id number]   HPI 56 year old female presents the office today for concerns of pain on her right fifth toe worse than left side.  She is concerned about 4 months ago and she thinks that she has an " accessory toenail" on the right fifth toe.  Area is tender.  On the left side she gets a corn on the inside aspect of the fifth toe causing discomfort she has seen dermatology previously to treatment.  Certain cause discomfort.  No swelling redness or drainage.  No other concerns.   Review of Systems  All other systems reviewed and are negative.  Past Medical History:  Diagnosis Date   Anxiety    Chronic insomnia    Depression    history of   Gluten intolerance 2011   Dr Marquette Saa   Keloid    Pneumonia    childhood   Seasonal allergic rhinitis    Sleep disorder    Dr Beacher May, chronic and severe insomnia   Torn ACL (anterior cruciate ligament)    left    Past Surgical History:  Procedure Laterality Date   ANTERIOR CRUCIATE LIGAMENT REPAIR Right 08/2013   [name]   DILATATION & CURETTAGE/HYSTEROSCOPY WITH MYOSURE N/A 04/22/2016   Procedure: DILATATION & CURETTAGE/HYSTEROSCOPY WITH  MYOSURE;  Surgeon: Servando Salina, MD;  Location: [facility];  Service: Gynecology;  Laterality: N/A;   DILATATION & CURETTAGE/HYSTEROSCOPY WITH MYOSURE N/A 08/31/2016   Procedure: [place];  Surgeon: Servando Salina, MD;  Location: [facility];  Service: Gynecology;  Laterality: N/A;   KNEE ARTHROSCOPY Left 1995   UPPER GASTROINTESTINAL ENDOSCOPY  2008   neg; Dr Watt Climes   UPPER GASTR[facility]INTESTINAL ENDOSCOPY  2009    [facility]     Current Outpatient Medications:    ALPRAZolam (XANAX) 0.25 MG tablet, Take 1 tablet (0.25 mg total) by mouth 2 (two) times daily as needed for anxiety., Disp: 20 tablet, Rfl: 0   amphetamine-dextroamphetamine (ADDERALL XR) 15 MG 24 hr capsule, Take 1 capsule by mouth daily.,  Disp: 30 capsule, Rfl: 0   betamethasone dipropionate 0.05 % cream, Apply to ears nightly, Disp: 45 g, Rfl: 2   diclofenac (VOLTAREN) 75 MG EC tablet, Take 75 mg by mouth daily., Disp: , Rfl: 0   ibandronate (BONIVA) 150 MG tablet, Take 1 tablet (150 mg total) by mouth every 30 (thirty) days. Take in the morning with a full glass of water, on an empty stomach, and do not take anything else by mouth or lie down for the next 30 min., Disp: 3 tablet, Rfl: 3   latanoprost (XALATAN) 0.005 % ophthalmic solution, Apply to eye., Disp: , Rfl:    Polyethyl Glycol-Propyl Glycol (SYSTANE OP), Place 1 drop into both eyes 2 (two) times daily., Disp: , Rfl:    QUEtiapine (SEROQUEL) 25 MG tablet, 1 tab at bed time and prn 1/2 tab extra. po, Disp: 135 tablet, Rfl: 2   tiZANidine (ZANAFLEX) 4 MG tablet, Take by mouth., Disp: , Rfl:    triamcinolone cream (KENALOG) 0.1 %, Apply 1 application topically daily. (Patient not taking: Reported on 05/22/2021), Disp: 80 g, Rfl: 2   valACYclovir (VALTREX) 1000 MG tablet, Take 1 tablet (1,000 mg total) by mouth daily., Disp: 30 tablet, Rfl: 4   XIIDRA 5 % SOLN, , Disp: , Rfl:   Allergies  Allergen Reactions   Gluten Meal Other (See Comments)    Gluten  intolerant         Objective:  Physical Exam  General: AAO x3, NAD  Dermatological: On the lateral aspect of the right fifth toenail is a hyperkeratotic lesion.  Upon debridement there is no underlying ulceration drainage or signs of infection.  No significant toenail formation is noted and appears to be more of a listers corn.  On the medial aspect of the left fifth toe at the IPJ's hyperkeratotic lesion.  No underlying ulceration drainage or signs of infection.  Vascular: Dorsalis Pedis artery and Posterior Tibial artery pedal pulses are 2/4 bilateral with immedate capillary fill time. There is no pain with calf compression, swelling, warmth, erythema.   Neruologic: Grossly intact via light touch bilateral.    Musculoskeletal: There is a varus with fifth toes.  Muscular strength 5/5 in all groups tested bilateral.  Gait: Unassisted, Nonantalgic.       Assessment:   Hyperkeratotic lesions     Plan:  -Treatment options discussed including all alternatives, risks, and complications -Etiology of symptoms were discussed -I sharply debrided the hyperkeratotic lesions to bilateral fifth toes without any complications or bleeding.  On the right fifth toe in particular is no signs of ingrown toenail but more of a listers corn.  Dispensed offloading pads.  Discussed shoe modifications.  In the future consider surgical intervention if needed to straighten the toe.   Trula Slade DPM

## 2021-06-22 ENCOUNTER — Ambulatory Visit (INDEPENDENT_AMBULATORY_CARE_PROVIDER_SITE_OTHER): Payer: PRIVATE HEALTH INSURANCE | Admitting: Neurology

## 2021-06-22 DIAGNOSIS — F5102 Adjustment insomnia: Secondary | ICD-10-CM

## 2021-06-22 DIAGNOSIS — F40242 Fear of bridges: Secondary | ICD-10-CM

## 2021-06-22 DIAGNOSIS — F5105 Insomnia due to other mental disorder: Secondary | ICD-10-CM

## 2021-06-22 DIAGNOSIS — G250 Essential tremor: Secondary | ICD-10-CM

## 2021-06-22 DIAGNOSIS — G4733 Obstructive sleep apnea (adult) (pediatric): Secondary | ICD-10-CM

## 2021-06-24 ENCOUNTER — Encounter: Payer: Self-pay | Admitting: Neurology

## 2021-06-24 NOTE — Procedures (Signed)
[facility] TEST REPORT ( by Watch PAT)   STUDY DATE:   DOB:   MRN:    ORDERING CLINICIAN:  REFERRING CLINICIAN:    CLINICAL INFORMATION/HISTORY: 04-15-2021:   Rv for Rito Ehrlich. Schuchart, 56 year old caucasian female with chronic insomnia, tremor, and a history of dry eyes, increased eye pressure (glaucoma? 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  Nondominant hand tremor has been increasing, she would be interested in the [place] device device which is a stimulator at the wrist which is a stimulator at the wrist appearing like a watch- I am happy to prescribe.   The form she brought me has no prescription for me to sign.  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 Noted more tremor in the left over right, in a right hand dominant female with history of essential familiar tremors as well as being on SEROquel for insomnia. This medication also can cause tremor. She is only 25 mg a day ( bedtime ) but reports non restorative sleep . She is still using adderall 15 XR.   Epworth sleepiness score: 9/24.   BMI: 25.5 kg/m   Neck Circumference: 14   FINDINGS:   Sleep Summary:   Total Recording Time (hours, min):  7 hours 35 m      Total Sleep Time (hours, min):    6 h and 41 m             Percent REM (%):    26.3%                                    Respiratory Indices:   Calculated pAHI (per hour):    1.1/h                         REM pAHI:     2.4/h                                            NREM pAHI:  0.6/h                            Positional AHI: Supine 1.9/h, left side 24.2/h and right side AHI of 4.5 /h.  Snoring volume mean value was 41 d B. Loudest prone and on the left side. Present for 22% of total sleep time.                                                   Oxygen Saturation Statistics:   O2 Saturation Range (%): O2 nadir 90 %  and maximum saturation at 100% , with a mean saturation at 97%.                                    O2 Saturation (minutes) <89%:   0 minutes         Pulse  Rate Statistics:           Pulse Range:   53 to 96 bpm , mean heart rate was 73 bpm              IMPRESSION:  This HST confirms no significant apnea to be present.    RECOMMENDATION: no intervention needed, no evidence of clinically significant sleep disordered breathing. I still would recommend to avoid the 2 sleep positions associated with loud snoring and moderate apnea- prone and left sided sleep.     INTERPRETING PHYSICIAN:   Larey Seat, MD   Medical Director of [facility].

## 2021-06-24 NOTE — Progress Notes (Signed)
[facility] [place] [facility] TEST REPORT ( by Watch PAT)   STUDY DATE:   DOB:   MRN:    ORDERING CLINICIAN:  REFERRING CLINICIAN:    CLINICAL INFORMATION/HISTORY: 04-15-2021:   Rv for Susan Fisher. Susan Fisher, 56 year old caucasian female with chronic insomnia, tremor, and a history of dry eyes, increased eye pressure (glaucoma? 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  Nondominant hand tremor has been increasing, she would be interested in the [place] device device which is a stimulator at the wrist which is a stimulator at the wrist appearing like a watch- I am happy to prescribe.   The form she brought me has no prescription for me to sign.  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 Noted more tremor in the left over right, in a right hand dominant female with history of essential familiar tremors as well as being on SEROquel for insomnia. This medication also can cause tremor. She is only 25 mg a day ( bedtime ) but reports non restorative sleep . She is still using adderall 15 XR.   Epworth sleepiness score: 9/24.   BMI: 25.5 kg/m   Neck Circumference: 14   FINDINGS:   Sleep Summary:   Total Recording Time (hours, min):  7 hours 35 m      Total Sleep Time (hours, min):    6 h and 41 m             Percent REM (%):    26.3%                                    Respiratory Indices:   Calculated pAHI (per hour):    1.1/h                         REM pAHI:     2.4/h                                            NREM pAHI:  0.6/h                            Positional AHI: Supine 1.9/h, left side 24.2/h and right side AHI of 4.5 /h.  Snoring volume mean value was 41 d B. Loudest prone and on the left side. Present for 22% of total sleep time.                                                   Oxygen Saturation Statistics:   O2 Saturation Range (%): O2  nadir 90 % and maximum saturation at 100% , with a mean saturation at 97%.                                    O2 Saturation (minutes) <89%:   0 minutes  Pulse Rate Statistics:           Pulse Range:   53 to 96 bpm , mean heart rate was 73 bpm              IMPRESSION:  This HST confirms no significant apnea to be present.    RECOMMENDATION: no intervention needed, no evidence of clinically significant sleep disordered breathing. I still would recommend to avoid the 2 sleep positions associated with loud snoring and moderate apnea- prone and left sided sleep.     INTERPRETING PHYSICIAN:   Larey Seat, MD   Medical Director of [facility] at [facility].

## 2021-07-14 ENCOUNTER — Other Ambulatory Visit: Payer: Self-pay | Admitting: Family Medicine

## 2021-07-14 MED ORDER — ALPRAZOLAM 0.25 MG PO TABS: 0.2500 mg | ORAL_TABLET | Freq: Two times a day (BID) | ORAL | 0 refills | Status: DC | PRN

## 2021-07-14 NOTE — Telephone Encounter (Signed)
[facility] is requesting to fill pt xanax . Please advise [facility] 

## 2021-07-17 ENCOUNTER — Encounter: Payer: Self-pay | Admitting: Family Medicine

## 2021-07-23 ENCOUNTER — Telehealth: Payer: Self-pay | Admitting: Physician Assistant

## 2021-07-23 DIAGNOSIS — R21 Rash and other nonspecific skin eruption: Secondary | ICD-10-CM

## 2021-07-23 MED ORDER — DOXYCYCLINE HYCLATE 100 MG PO CAPS: 100.0000 mg | ORAL_CAPSULE | Freq: Two times a day (BID) | ORAL | 0 refills | Status: AC

## 2021-07-23 MED ORDER — TRIAMCINOLONE ACETONIDE 0.1 % EX CREA: 1.0000 "application " | TOPICAL_CREAM | Freq: Two times a day (BID) | CUTANEOUS | 3 refills | Status: DC | PRN

## 2021-07-23 NOTE — Telephone Encounter (Signed)
She called because she has a rash. She sent a picture to my cell phone. I will call her in triamcinalone cream. Follow up in 2 weeks.  ?

## 2021-08-03 ENCOUNTER — Other Ambulatory Visit: Payer: Self-pay | Admitting: Adult Health

## 2021-08-03 DIAGNOSIS — F5102 Adjustment insomnia: Secondary | ICD-10-CM

## 2021-08-03 MED ORDER — AMPHETAMINE-DEXTROAMPHET ER 15 MG PO CP24: 15.0000 mg | ORAL_CAPSULE | Freq: Every day | ORAL | 0 refills | Status: DC

## 2021-08-03 NOTE — Telephone Encounter (Signed)
Last visit: 04/15/21 with Dr Brett Fairy ?Next visit: 10/14/21 with Jinny Blossom NP ?Per [place] registry, last filled on 04/07/2021 #30/30 ?Refill request sent to Dr Brett Fairy ?

## 2021-09-09 ENCOUNTER — Other Ambulatory Visit: Payer: Self-pay | Admitting: Neurology

## 2021-09-09 DIAGNOSIS — F5102 Adjustment insomnia: Secondary | ICD-10-CM

## 2021-09-09 MED ORDER — AMPHETAMINE-DEXTROAMPHET ER 15 MG PO CP24: 15.0000 mg | ORAL_CAPSULE | Freq: Every day | ORAL | 0 refills | Status: DC

## 2021-09-09 NOTE — Telephone Encounter (Signed)
[place] drug registry has been verified. Last refill was 08/03/2021 # 30 for a 30 day supply. ? ?Last visit was 04/15/2021 and next f/u is 10/14/2021.  ?

## 2021-09-09 NOTE — Telephone Encounter (Signed)
Pt is requesting a refill for amphetamine-dextroamphetamine (ADDERALL XR) 15 MG 24 hr capsule. ? ?Pharmacy: [facility] [phone]  ? ?

## 2021-09-15 ENCOUNTER — Ambulatory Visit: Payer: PRIVATE HEALTH INSURANCE | Admitting: Physician Assistant

## 2021-10-12 ENCOUNTER — Telehealth: Payer: Self-pay | Admitting: Neurology

## 2021-10-12 NOTE — Telephone Encounter (Signed)
..   Pt understands that although there may be some limitations with this type of visit, we will take all precautions to reduce any security or privacy concerns.  Pt understands that this will be treated like an in office visit and we will file with pt's insurance, and there may be a patient responsible charge related to this service. ? ?

## 2021-10-14 ENCOUNTER — Telehealth: Payer: Self-pay | Admitting: Adult Health

## 2021-10-14 ENCOUNTER — Telehealth (INDEPENDENT_AMBULATORY_CARE_PROVIDER_SITE_OTHER): Payer: PRIVATE HEALTH INSURANCE | Admitting: Adult Health

## 2021-10-14 DIAGNOSIS — G250 Essential tremor: Secondary | ICD-10-CM | POA: Diagnosis not present

## 2021-10-14 DIAGNOSIS — F5102 Adjustment insomnia: Secondary | ICD-10-CM

## 2021-10-14 DIAGNOSIS — F5105 Insomnia due to other mental disorder: Secondary | ICD-10-CM

## 2021-10-14 DIAGNOSIS — R400 Somnolence: Secondary | ICD-10-CM

## 2021-10-14 MED ORDER — QUETIAPINE FUMARATE 25 MG PO TABS: ORAL_TABLET | ORAL | 2 refills | Status: DC

## 2021-10-14 MED ORDER — AMPHETAMINE-DEXTROAMPHET ER 15 MG PO CP24: 15.0000 mg | ORAL_CAPSULE | Freq: Every day | ORAL | 0 refills | Status: DC

## 2021-10-14 NOTE — Progress Notes (Signed)
PATIENT: Susan Fisher DOB: May 15, 1965  REASON FOR VISIT: follow up HISTORY FROM: patient  Virtual Visit via Video Note  I connected with Susan Fisher on 10/14/21 at  8:30 AM EDT by a video enabled telemedicine application located remotely at [facility] and verified that I am speaking with the correct person using two identifiers who was located at their own home.   I discussed the limitations of evaluation and management by telemedicine and the availability of in person appointments. The patient expressed understanding and agreed to proceed.   PATIENT: Susan Fisher DOB: Sep 16, 1965  REASON FOR VISIT: follow up HISTORY FROM: patient  HISTORY OF PRESENT ILLNESS: Today 10/14/21  Susan Fisher is a 56 year old female with a history of essential tremor, insomnia and daytime sleepiness.  She returns today for follow-up.  She reports that she continues to have trouble sleeping and staying awake during the day.  She is taking Seroquel 1-1/2 tablets at bedtime and continues taking Adderall extended release 15 mg during the day.  She states that she usually gets anxiety when she is trying to go to sleep.  If she does not fall asleep within 15 minutes she begins to have a panic attack and that interrupts her from being able to go to sleep.  She was given a prescription for the [facility] device for tremor however she has not gotten this yet.  She returns today for an evaluation.  REVIEW OF SYSTEMS: Out of a complete 14 system review of symptoms, the patient complains only of the following symptoms, and all other reviewed systems are negative.  ALLERGIES: Allergies  Allergen Reactions   Gluten Meal Other (See Comments)    Gluten intolerant    HOME MEDICATIONS: Outpatient Medications Prior to Visit  Medication Sig Dispense Refill   ALPRAZolam (XANAX) 0.25 MG tablet Take 1 tablet (0.25 mg total) by mouth 2 (two) times daily as needed for anxiety. 20 tablet 0    amphetamine-dextroamphetamine (ADDERALL XR) 15 MG 24 hr capsule Take 1 capsule by mouth daily. 30 capsule 0   betamethasone dipropionate 0.05 % cream Apply to ears nightly 45 g 2   diclofenac (VOLTAREN) 75 MG EC tablet Take 75 mg by mouth daily.  0   ibandronate (BONIVA) 150 MG tablet Take 1 tablet (150 mg total) by mouth every 30 (thirty) days. Take in the morning with a full glass of water, on an empty stomach, and do not take anything else by mouth or lie down for the next 30 min. 3 tablet 3   latanoprost (XALATAN) 0.005 % ophthalmic solution Apply to eye.     Polyethyl Glycol-Propyl Glycol (SYSTANE OP) Place 1 drop into both eyes 2 (two) times daily.     QUEtiapine (SEROQUEL) 25 MG tablet 1 tab at bed time and prn 1/2 tab extra. po 135 tablet 2   tiZANidine (ZANAFLEX) 4 MG tablet Take by mouth.     triamcinolone cream (KENALOG) 0.1 % Apply 1 application topically daily. (Patient not taking: Reported on 05/22/2021) 80 g 2   triamcinolone cream (KENALOG) 0.1 % Apply 1 application. topically 2 (two) times daily as needed. 80 g 3   valACYclovir (VALTREX) 1000 MG tablet Take 1 tablet (1,000 mg total) by mouth daily. 30 tablet 4   XIIDRA 5 % SOLN      No facility-administered medications prior to visit.    PAST MEDICAL HISTORY: Past Medical History:  Diagnosis Date   Anxiety  Chronic insomnia    Depression    history of   Gluten intolerance 2011   Dr Marquette Saa   Keloid    Pneumonia    childhood   Seasonal allergic rhinitis    Sleep disorder    Dr Beacher May, chronic and severe insomnia   Torn ACL (anterior cruciate ligament)    left    PAST SURGICAL HISTORY: Past Surgical History:  Procedure Laterality Date   ANTERIOR CRUCIATE LIGAMENT REPAIR Right 08/2013   [name]   DILATATION & CURETTAGE/HYSTEROSCOPY WITH MYOSURE N/A 04/22/2016   Procedure: DILATATION & CURETTAGE/HYSTEROSCOPY WITH  MYOSURE;  Surgeon: Servando Salina, MD;  Location: [facility];  Service: Gynecology;   Laterality: N/A;   DILATATION & CURETTAGE/HYSTEROSCOPY WITH MYOSURE N/A 08/31/2016   Procedure: [place];  Surgeon: Servando Salina, MD;  Location: [facility];  Service: Gynecology;  Laterality: N/A;   KNEE ARTHROSCOPY Left 1995   UPPER GASTROINTESTINAL ENDOSCOPY  2008   neg; Dr Watt Climes   UPPER GASTROINTESTINAL ENDOSCOPY  2009    [facility]    FAMILY HISTORY: Family History  Problem Relation Age of Onset   Anxiety disorder Mother    Depression Mother    Hypertension Mother    Stroke Mother 69   Heart failure Mother    Heart attack Mother 49   Breast cancer Mother 16   COPD Mother        smoker   Pulmonary embolism Father 23       post rotator cuff surgery   Heart attack Paternal Grandfather 70   Tremor Sister    Tremor Sister     SOCIAL HISTORY: Social History   Socioeconomic History   Marital status: Single    Spouse name: Not on file   Number of children: 0   Years of education: College   Highest education level: Not on file  Occupational History    Employer: [employer]  Tobacco Use   Smoking status: Never   Smokeless tobacco: Never  Vaping Use   Vaping Use: Never used  Substance and Sexual Activity   Alcohol use: Yes    Alcohol/week: 7.0 standard drinks of alcohol    Types: 7 Glasses of wine per week    Comment:  socially   Drug use: No   Sexual activity: Not Currently  Other Topics Concern   Not on file  Social History Narrative   Patient is single and lives alone.     Patient has a college education.   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 -    planning to enroll with PT asst school at [employer] fall 2016   Patient drinks two caffeine drinks daily.   Patient is right-handed.      Social Determinants of Health   Financial Resource Strain: Not on file  Food Insecurity: Not on file  Transportation Needs: Not on file  Physical Activity: Not on file  Stress: Not on file  Social Connections: Not on file   Intimate Partner Violence: Not on file      PHYSICAL EXAM Generalized: Well developed, in no acute distress, patient was driving when she answered the virtual visit advised that she would need to pull over to continue the visit or reschedule.  She was able to pull over  Neurological examination  Mentation: Alert oriented to time, place, history taking. Follows all commands speech and language fluent Cranial nerve II-XII:Extraocular movements were full. Facial symmetry noted. uvula tongue midline. Head turning and shoulder  shrug  were normal and symmetric.   DIAGNOSTIC DATA (LABS, IMAGING, TESTING) - I reviewed patient records, labs, notes, testing and imaging myself where available.  Lab Results  Component Value Date   WBC 5.6 05/22/2021   HGB 15.1 05/22/2021   HCT 45.9 05/22/2021   MCV 93 05/22/2021   PLT 165 05/22/2021      Component Value Date/Time   NA 140 05/22/2021 1450   K 4.8 05/22/2021 1450   CL 103 05/22/2021 1450   CO2 24 05/22/2021 1450   GLUCOSE 83 05/22/2021 1450   GLUCOSE 76 11/22/2016 1408   BUN 13 05/22/2021 1450   CREATININE 0.75 05/22/2021 1450   CREATININE 0.76 11/22/2016 1408   CALCIUM 9.4 05/22/2021 1450   PROT 6.7 05/22/2021 1450   ALBUMIN 4.4 05/22/2021 1450   AST 21 05/22/2021 1450   ALT 24 05/22/2021 1450   ALKPHOS 75 05/22/2021 1450   BILITOT 0.4 05/22/2021 1450   GFRNONAA 97 11/08/2018 1445   GFRAA 111 11/08/2018 1445   Lab Results  Component Value Date   CHOL 186 05/22/2021   HDL 78 05/22/2021   LDLCALC 95 05/22/2021   TRIG 72 05/22/2021   CHOLHDL 2.4 05/22/2021   No results found for: "HGBA1C" No results found for: "VITAMINB12" Lab Results  Component Value Date   TSH 2.70 11/08/2014      ASSESSMENT AND PLAN 56 y.o. year old female  has a past medical history of Anxiety, Chronic insomnia, Depression, Gluten intolerance (2011), Keloid, Pneumonia, Seasonal allergic rhinitis, Sleep disorder, and Torn ACL (anterior cruciate  ligament). here with :  1.  Insomnia 2.  Daytime sleepiness 3.  Essential tremor  Continue Seroquel 25 mg 1-1/2 tablets at bedtime Continue Adderall extended release 50 mg daily Advised her to look into getting the [place] the patient that since she continues to have trouble with insomnia related to anxiety advised that she may benefit from cognitive behavioral therapy.  She is amendable to a referral Follow-up in 6 months or sooner if needed    Ward Givens, MSN, NP-C 10/14/2021, 8:55 AM [facility] [address], [place], [place] [address] [phone]

## 2021-10-14 NOTE — Telephone Encounter (Signed)
Referral for Neuropsychology sent to [facility] [phone]. 

## 2021-10-29 NOTE — Telephone Encounter (Signed)
Multiple attempts were made to contact the patient by phone/voicemail. Patient has not returned or answered [facility]S calls.

## 2021-11-06 ENCOUNTER — Encounter: Payer: Self-pay | Admitting: Family Medicine

## 2021-11-06 ENCOUNTER — Other Ambulatory Visit: Payer: Self-pay | Admitting: Family Medicine

## 2021-11-06 DIAGNOSIS — Z1211 Encounter for screening for malignant neoplasm of colon: Secondary | ICD-10-CM

## 2021-11-06 MED ORDER — ALPRAZOLAM 0.25 MG PO TABS: 0.2500 mg | ORAL_TABLET | Freq: Two times a day (BID) | ORAL | 0 refills | Status: DC | PRN

## 2021-11-24 ENCOUNTER — Other Ambulatory Visit: Payer: Self-pay | Admitting: Physician Assistant

## 2021-11-24 DIAGNOSIS — B001 Herpesviral vesicular dermatitis: Secondary | ICD-10-CM

## 2021-12-14 ENCOUNTER — Other Ambulatory Visit: Payer: Self-pay | Admitting: Adult Health

## 2021-12-14 ENCOUNTER — Other Ambulatory Visit: Payer: Self-pay | Admitting: Family Medicine

## 2021-12-14 DIAGNOSIS — F5102 Adjustment insomnia: Secondary | ICD-10-CM

## 2021-12-14 MED ORDER — ALPRAZOLAM 0.25 MG PO TABS: 0.2500 mg | ORAL_TABLET | Freq: Two times a day (BID) | ORAL | 0 refills | Status: DC | PRN

## 2021-12-14 MED ORDER — AMPHETAMINE-DEXTROAMPHET ER 15 MG PO CP24: 15.0000 mg | ORAL_CAPSULE | Freq: Every day | ORAL | 0 refills | Status: DC

## 2021-12-14 NOTE — Telephone Encounter (Signed)
Refill request for Xanax last apt 05/22/21 next apt 05/28/22.

## 2021-12-14 NOTE — Telephone Encounter (Signed)
Last visit: 10/14/21 Next visit: TBD Per [place] registry, last filled on 10/14/2021 #30/30. Rx refill sent to [facility] NP.

## 2021-12-14 NOTE — Telephone Encounter (Signed)
Pt request refill for amphetamine-dextroamphetamine (ADDERALL XR) 15 MG 24 hr capsule at [facility]

## 2021-12-23 ENCOUNTER — Ambulatory Visit: Payer: PRIVATE HEALTH INSURANCE | Admitting: Physician Assistant

## 2021-12-30 ENCOUNTER — Encounter: Payer: Self-pay | Admitting: Internal Medicine

## 2022-01-20 LAB — COLOGUARD: COLOGUARD: NEGATIVE

## 2022-02-02 ENCOUNTER — Encounter: Payer: Self-pay | Admitting: Internal Medicine

## 2022-02-15 ENCOUNTER — Encounter: Payer: Self-pay | Admitting: Internal Medicine

## 2022-02-15 ENCOUNTER — Other Ambulatory Visit: Payer: Self-pay | Admitting: Family Medicine

## 2022-02-15 MED ORDER — ALPRAZOLAM 0.25 MG PO TABS: 0.2500 mg | ORAL_TABLET | Freq: Two times a day (BID) | ORAL | 0 refills | Status: DC | PRN

## 2022-02-15 MED ORDER — DICLOFENAC SODIUM 75 MG PO TBEC
75.0000 mg | DELAYED_RELEASE_TABLET | Freq: Two times a day (BID) | ORAL | 1 refills | Status: DC | PRN
Start: 2022-02-15 — End: 2022-04-12

## 2022-02-22 ENCOUNTER — Telehealth: Payer: Self-pay | Admitting: Adult Health

## 2022-02-22 MED ORDER — AMPHETAMINE-DEXTROAMPHETAMINE 10 MG PO TABS: 15.0000 mg | ORAL_TABLET | Freq: Every day | ORAL | 0 refills | Status: DC | PRN

## 2022-02-22 NOTE — Addendum Note (Signed)
Addended by: Trudie Buckler on: 02/22/2022 03:16 PM   Modules accepted: Orders

## 2022-02-22 NOTE — Telephone Encounter (Signed)
Can we ask what is going on and why she wants to switch

## 2022-02-22 NOTE — Telephone Encounter (Signed)
Order sent.

## 2022-02-22 NOTE — Telephone Encounter (Signed)
Checked registry. Last filled on 12/14/2021 Dextroamp-Amphet Er 15 Mg Cap #30/30. I called the pt and let her know that [facility] NP approved pt's request to change to IR. I discussed the instructions for new adderall 10 mg IR prescription, take 1.5 tablets PO daily PRN.She was advised to call back if she has any further questions or concerns.   Pt verbalized understanding and appreciation.

## 2022-02-22 NOTE — Telephone Encounter (Signed)
Pt is calling. Stated she needs a refill on medication amphetamine-dextroamphetamine (ADDERALL XR) 15 MG 24 hr capsule. States she would like regular and not the extended release. Refill should be sent to [facility] [phone]

## 2022-02-22 NOTE — Telephone Encounter (Signed)
I spoke with the patient.  She states that some nights she is able to sleep and does not need much the next day and other nights she is up all night.  Some days she would like to be able to take half of a dose but she is unable to do this since it is a capsule and is a extended release.  She also reports that she recently switched jobs.  She still works days shift but at her previous job she was working 19 hours/day and now she works less hours per day. She wakes up at 530 AM.  I told her we would send her request to [facility] NP and call her back as soon as possible, may be tomorrow since [place] NP isn't seeing patients today.

## 2022-03-30 ENCOUNTER — Other Ambulatory Visit: Payer: Self-pay | Admitting: Adult Health

## 2022-03-30 MED ORDER — AMPHETAMINE-DEXTROAMPHETAMINE 10 MG PO TABS: 15.0000 mg | ORAL_TABLET | Freq: Every day | ORAL | 0 refills | Status: DC | PRN

## 2022-03-30 NOTE — Telephone Encounter (Signed)
Pt request refill for amphetamine-dextroamphetamine (ADDERALL) 10 MG tablet at  [facility]

## 2022-03-30 NOTE — Telephone Encounter (Signed)
Last seen 09-2021, no follow up scheduled.  Will send mychart to schedule.

## 2022-04-05 ENCOUNTER — Telehealth: Payer: Self-pay | Admitting: *Deleted

## 2022-04-05 NOTE — Telephone Encounter (Signed)
Received PA request from pharmacy for Adderall 10 mg tablet. I called the pharmacy and was advised the patient used goodrx instead.

## 2022-04-12 ENCOUNTER — Other Ambulatory Visit: Payer: Self-pay | Admitting: Family Medicine

## 2022-04-15 ENCOUNTER — Other Ambulatory Visit: Payer: Self-pay | Admitting: Family Medicine

## 2022-04-15 MED ORDER — ALPRAZOLAM 0.25 MG PO TABS: 0.2500 mg | ORAL_TABLET | Freq: Two times a day (BID) | ORAL | 0 refills | Status: DC | PRN

## 2022-04-15 NOTE — Telephone Encounter (Signed)
Is this okay to refill? 

## 2022-05-28 ENCOUNTER — Encounter: Payer: PRIVATE HEALTH INSURANCE | Admitting: Family Medicine

## 2022-06-01 ENCOUNTER — Other Ambulatory Visit: Payer: Self-pay | Admitting: Family Medicine

## 2022-06-01 ENCOUNTER — Telehealth: Payer: Self-pay | Admitting: Adult Health

## 2022-06-01 ENCOUNTER — Other Ambulatory Visit: Payer: Self-pay | Admitting: *Deleted

## 2022-06-01 MED ORDER — ALPRAZOLAM 0.25 MG PO TABS: 0.2500 mg | ORAL_TABLET | Freq: Two times a day (BID) | ORAL | 0 refills | Status: DC | PRN

## 2022-06-01 MED ORDER — AMPHETAMINE-DEXTROAMPHETAMINE 10 MG PO TABS: 15.0000 mg | ORAL_TABLET | Freq: Every day | ORAL | 0 refills | Status: DC | PRN

## 2022-06-01 NOTE — Telephone Encounter (Signed)
Is it ok to refill medication? Patient has an appointment scheduled for 06/22/2022.

## 2022-06-01 NOTE — Telephone Encounter (Signed)
Pt is calling. Requesting a refill on amphetamine-dextroamphetamine (ADDERALL) 10 MG tablet. Should be sent to [facility] [phone]

## 2022-06-01 NOTE — Telephone Encounter (Signed)
Is this okay to refill? 

## 2022-06-08 ENCOUNTER — Other Ambulatory Visit: Payer: Self-pay | Admitting: Family Medicine

## 2022-06-08 NOTE — Telephone Encounter (Signed)
Refill request last apt 1//27/23 next apt 06/22/22

## 2022-06-22 ENCOUNTER — Ambulatory Visit: Payer: BC Managed Care – PPO | Admitting: Family Medicine

## 2022-06-22 ENCOUNTER — Encounter: Payer: Self-pay | Admitting: Family Medicine

## 2022-06-22 VITALS — BP 110/70 | HR 62 | Temp 98.2°F | Ht <= 58 in | Wt 118.2 lb

## 2022-06-22 DIAGNOSIS — M816 Localized osteoporosis [Lequesne]: Secondary | ICD-10-CM

## 2022-06-22 DIAGNOSIS — G479 Sleep disorder, unspecified: Secondary | ICD-10-CM

## 2022-06-22 DIAGNOSIS — G250 Essential tremor: Secondary | ICD-10-CM

## 2022-06-22 DIAGNOSIS — M1990 Unspecified osteoarthritis, unspecified site: Secondary | ICD-10-CM

## 2022-06-22 DIAGNOSIS — F411 Generalized anxiety disorder: Secondary | ICD-10-CM | POA: Diagnosis not present

## 2022-06-22 DIAGNOSIS — B001 Herpesviral vesicular dermatitis: Secondary | ICD-10-CM

## 2022-06-22 DIAGNOSIS — Z1321 Encounter for screening for nutritional disorder: Secondary | ICD-10-CM

## 2022-06-22 DIAGNOSIS — J302 Other seasonal allergic rhinitis: Secondary | ICD-10-CM

## 2022-06-22 DIAGNOSIS — K9041 Non-celiac gluten sensitivity: Secondary | ICD-10-CM

## 2022-06-22 DIAGNOSIS — Z0000 Encounter for general adult medical examination without abnormal findings: Secondary | ICD-10-CM

## 2022-06-22 DIAGNOSIS — Z8619 Personal history of other infectious and parasitic diseases: Secondary | ICD-10-CM | POA: Insufficient documentation

## 2022-06-22 LAB — LIPID PANEL

## 2022-06-22 MED ORDER — VALACYCLOVIR HCL 1 G PO TABS: 1000.0000 mg | ORAL_TABLET | Freq: Every day | ORAL | 3 refills | Status: DC

## 2022-06-22 NOTE — Progress Notes (Signed)
Complete physical exam  Patient: Susan Fisher   DOB: 1966/03/18   57 y.o. Female  MRN: [id number]  Subjective:    Chief Complaint  Patient presents with   Annual Exam    Fasting. No additional concerns.     Susan Fisher is a 57 y.o. female who presents today for a complete physical exam. She reports consuming a general diet. Gym/ health club routine includes cardio and yoga. She generally feels fairly well. She reports sleeping poorly. She does have additional problems to discuss today (states, her GYN recommends testing for Sjogrens syndrome due to her dry eyes.  She also has a history of arthritis.  She also has a previous history of stress fracture however the DEXA scan showed osteopenia.  I treated her anyway because of the stress fracture however she has not been taking the Boniva on a regular basis.  She was placed on it in 2020 but has been taking it very intermittently.  She does have arthritis and does use Voltaren on an as-needed basis.  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  She needs to take this daily to prevent it from occurring.  She is followed by Dr. Brett Fairy for her sleep disturbance.  She does have underlying anxiety and in the past had been in counseling but is not in any at the present time.  She seems to be doing fairly well according to her thoughts.  Her allergies are okay.  She has gluten intolerance and does do a good job of keeping this under control.  Otherwise her family and social history as well as health maintenance and immunizations was reviewed.  6   Most recent fall risk assessment:    06/22/2022    2:18 PM  [place] in the past year? 0  Number falls in past yr: 0  Injury with Fall? 0  Risk for fall due to : No Fall Risks  Follow up Falls evaluation completed     Most recent depression screenings:    06/22/2022    2:19 PM 11/08/2018    1:50 PM  PHQ 2/9 Scores  PHQ - 2 Score 1 0    Vision:Within last year and  Dental: Receives regular dental care    Patient Care Team: Denita Lung, MD as PCP - General (Family Medicine) Elsie Saas, MD as Consulting Physician (Orthopedic Surgery) Dohmeier, Asencion Partridge, MD (Neurology) Servando Salina, MD (Obstetrics and Gynecology) Starlyn Skeans (Dermatology) Lavonna Monarch, MD (Inactive) as Consulting Physician (Dermatology)   Outpatient Medications Prior to Visit  Medication Sig Note   ALPRAZolam (XANAX) 0.25 MG tablet Take 1 tablet (0.25 mg total) by mouth 2 (two) times daily as needed for anxiety.    amphetamine-dextroamphetamine (ADDERALL) 10 MG tablet Take 1.5 tablets (15 mg total) by mouth daily as needed.    diclofenac (VOLTAREN) 75 MG EC tablet TAKE 1 TABLET(75 MG) BY MOUTH TWICE DAILY AS NEEDED    Polyethyl Glycol-Propyl Glycol (SYSTANE OP) Place 1 drop into both eyes 2 (two) times daily.    QUEtiapine (SEROQUEL) 25 MG tablet 1 tab at bed time and prn 1/2 tab extra. po    [DISCONTINUED] valACYclovir (VALTREX) 1000 MG tablet Take 1 tablet (1,000 mg total) by mouth daily.    betamethasone dipropionate 0.05 % cream Apply to ears nightly (Patient not taking: Reported on 06/22/2022)    ibandronate (BONIVA) 150 MG tablet Take 1 tablet (150 mg total) by  mouth every 30 (thirty) days. Take in the morning with a full glass of water, on an empty stomach, and do not take anything else by mouth or lie down for the next 30 min. (Patient not taking: Reported on 06/22/2022) 06/22/2022: Forgets to take medication   latanoprost (XALATAN) 0.005 % ophthalmic solution Apply to eye. (Patient not taking: Reported on 06/22/2022)    tiZANidine (ZANAFLEX) 4 MG tablet Take by mouth. (Patient not taking: Reported on 06/22/2022) 06/22/2022: Using prn   triamcinolone cream (KENALOG) 0.1 % Apply 1 application topically daily. (Patient not taking: Reported on 05/22/2021)    triamcinolone cream (KENALOG) 0.1 % Apply 1 application. topically 2 (two) times daily as needed. (Patient  not taking: Reported on 06/22/2022)    XIIDRA 5 % SOLN  (Patient not taking: Reported on 06/22/2022)    No facility-administered medications prior to visit.    Review of Systems  All other systems reviewed and are negative.         Objective:     BP 110/70   Pulse 62   Temp 98.2 F (36.8 C) (Oral)   Ht '4\' 9"'$  (1.448 m)   Wt 118 lb 3.2 oz (53.6 kg)   SpO2 98% Comment: room air  BMI 25.58 kg/m    Physical Exam   No results found for any visits on 06/22/22.     Assessment & Plan:    Routine general medical examination at a health care facility - Plan: CBC with Differential/Platelet, Comprehensive metabolic panel, Lipid panel  Benign essential tremor  Anxiety state  Arthritis - Plan: Ambulatory referral to Rheumatology  Gluten intolerance  Disturbance in sleep behavior  Seasonal allergies  H/O herpes labialis  Localized osteoporosis without current pathological fracture - Plan: DG Bone Density  Encounter for vitamin deficiency screening - Plan: VITAMIN D 25 Hydroxy (Vit-D Deficiency, Fractures)  Fever blister - Plan: valACYclovir (VALTREX) 1000 MG tablet  Immunization History  Administered Date(s) Administered   Hepatitis A, Adult 09/10/2014, 03/06/2015   Hepatitis A, Ped/Adol-2 Dose 09/10/2014, 03/06/2015   Hepatitis B, ADULT 11/07/2014, 12/03/2014, 03/06/2015   Hepatitis B, PED/ADOLESCENT 11/07/2014, 12/03/2014, 03/06/2015   Influenza, Seasonal, Injecte, Preservative Fre 02/28/2016   Influenza-Unspecified 01/28/2014, 02/28/2016, 02/11/2021   Moderna Covid-19 Vaccine Bivalent Booster 40yr & up 01/22/2021   Moderna Sars-Covid-2 Vaccination 04/22/2019, 05/22/2019, 02/21/2020, 08/17/2020   PPD Test 11/12/2014, 12/03/2014, 01/10/2017   Td 03/29/2008   Tdap 11/08/2018    Health Maintenance  Topic Date Due   HIV Screening  Never done   Zoster Vaccines- Shingrix (1 of 2) Never done   INFLUENZA VACCINE  11/24/2021   COVID-19 Vaccine (6 - 2023-24 season)  12/25/2021   MAMMOGRAM  08/26/2022   Fecal DNA (Cologuard)  01/14/2025   PAP SMEAR-Modifier  07/03/2025   DTaP/Tdap/Td (3 - Td or Tdap) 11/07/2028   Hepatitis C Screening  Completed   HPV VACCINES  Aged Out    Discussed discussed the fact that since she took the Boniva intermittently will be difficult to determine the adequacy of the DEXA scan.  I will renew her Valtrex.  She will continue to be followed by GYN.  Go ahead and refer to rheumatology to get their input into whether this could possibly be Sjogren's syndrome.  She will continue to be followed by neurology for her sleep disturbance. Problem List Items Addressed This Visit     Anxiety state   Arthritis   Relevant Orders   Ambulatory referral to Rheumatology   Benign essential tremor  Disturbance in sleep behavior   Gluten intolerance   H/O herpes labialis   Seasonal allergies   Other Visit Diagnoses     Routine general medical examination at a health care facility    -  Primary   Relevant Orders   CBC with Differential/Platelet   Comprehensive metabolic panel   Lipid panel   Localized osteoporosis without current pathological fracture       Relevant Orders   DG Bone Density   Encounter for vitamin deficiency screening       Relevant Orders   VITAMIN D 25 Hydroxy (Vit-D Deficiency, Fractures)   Fever blister       Relevant Medications   valACYclovir (VALTREX) 1000 MG tablet      Recheck 1 year   Jill Alexanders, MD

## 2022-06-23 ENCOUNTER — Encounter: Payer: Self-pay | Admitting: Family Medicine

## 2022-06-23 LAB — CBC WITH DIFFERENTIAL/PLATELET
Basophils Absolute: 0.1 10*3/uL (ref 0.0–0.2)
Basos: 1 %
EOS (ABSOLUTE): 0.2 10*3/uL (ref 0.0–0.4)
Eos: 2 %
Hematocrit: 42.1 % (ref 34.0–46.6)
Hemoglobin: 14.1 g/dL (ref 11.1–15.9)
Immature Grans (Abs): 0 10*3/uL (ref 0.0–0.1)
Immature Granulocytes: 0 %
Lymphocytes Absolute: 1.8 10*3/uL (ref 0.7–3.1)
Lymphs: 26 %
MCH: 31.2 pg (ref 26.6–33.0)
MCHC: 33.5 g/dL (ref 31.5–35.7)
MCV: 93 fL (ref 79–97)
Monocytes Absolute: 0.4 10*3/uL (ref 0.1–0.9)
Monocytes: 6 %
Neutrophils Absolute: 4.5 10*3/uL (ref 1.4–7.0)
Neutrophils: 65 %
Platelets: 190 10*3/uL (ref 150–450)
RBC: 4.52 x10E6/uL (ref 3.77–5.28)
RDW: 13.6 % (ref 11.7–15.4)
WBC: 6.9 10*3/uL (ref 3.4–10.8)

## 2022-06-23 LAB — COMPREHENSIVE METABOLIC PANEL
ALT: 18 IU/L (ref 0–32)
AST: 21 IU/L (ref 0–40)
Albumin/Globulin Ratio: 2.2 (ref 1.2–2.2)
Albumin: 4.4 g/dL (ref 3.8–4.9)
Alkaline Phosphatase: 58 IU/L (ref 44–121)
BUN/Creatinine Ratio: 30 — ABNORMAL HIGH (ref 9–23)
BUN: 23 mg/dL (ref 6–24)
Bilirubin Total: 0.4 mg/dL (ref 0.0–1.2)
CO2: 21 mmol/L (ref 20–29)
Calcium: 9.1 mg/dL (ref 8.7–10.2)
Chloride: 103 mmol/L (ref 96–106)
Creatinine, Ser: 0.77 mg/dL (ref 0.57–1.00)
Globulin, Total: 2 g/dL (ref 1.5–4.5)
Glucose: 86 mg/dL (ref 70–99)
Potassium: 4.2 mmol/L (ref 3.5–5.2)
Sodium: 138 mmol/L (ref 134–144)
Total Protein: 6.4 g/dL (ref 6.0–8.5)
eGFR: 91 mL/min/{1.73_m2} (ref >59)

## 2022-06-23 LAB — LIPID PANEL
Chol/HDL Ratio: 2.6 ratio (ref 0.0–4.4)
Cholesterol, Total: 227 mg/dL — ABNORMAL HIGH (ref 100–199)
HDL: 89 mg/dL (ref >39)
LDL Chol Calc (NIH): 130 mg/dL — ABNORMAL HIGH (ref 0–99)
Triglycerides: 48 mg/dL (ref 0–149)
VLDL Cholesterol Cal: 8 mg/dL (ref 5–40)

## 2022-06-23 LAB — VITAMIN D 25 HYDROXY (VIT D DEFICIENCY, FRACTURES): Vit D, 25-Hydroxy: 94.9 ng/mL (ref 30.0–100.0)

## 2022-07-01 ENCOUNTER — Other Ambulatory Visit: Payer: Self-pay | Admitting: Adult Health

## 2022-07-01 MED ORDER — AMPHETAMINE-DEXTROAMPHETAMINE 10 MG PO TABS: 15.0000 mg | ORAL_TABLET | Freq: Every day | ORAL | 0 refills | Status: DC | PRN

## 2022-07-07 NOTE — Progress Notes (Unsigned)
PATIENT: Susan Fisher DOB: [birth year]  REASON FOR VISIT: follow up HISTORY FROM: patient PRIMARY NEUROLOGIST: Dr. Brett Fairy  Chief Complaint  Patient presents with   Follow-up    Pt in 4  Pt here for tremor and insomnia f/u Pt states no changes since last visit Pt states no questions or concerns for this visit      HISTORY OF PRESENT ILLNESS: Today 07/07/22  Susan Fisher is a 57 y.o. female who has been followed in this office for essential tremor, insomnia and daytime sleepiness. Returns today for follow-up. Usually only takes 1 tablet of Seroquel at bedtime and works fairly well for sleep.  She states that there are nights that she does have trouble going to sleep.  Overall though she feels that she is doing well continues to take Adderall extended release 15 mg during the day.  Tremor is relatively stable.  Has good days and bad days.  Returns today for an evaluation  HISTORY  10/14/21   Susan Fisher is a 57 year old female with a history of essential tremor, insomnia and daytime sleepiness.  She returns today for follow-up.  She reports that she continues to have trouble sleeping and staying awake during the day.  She is taking Seroquel 1-1/2 tablets at bedtime and continues taking Adderall extended release 15 mg during the day.  She states that she usually gets anxiety when she is trying to go to sleep.  If she does not fall asleep within 15 minutes she begins to have a panic attack and that interrupts her from being able to go to sleep.  She was given a prescription for the [facility] device for tremor however she has not gotten this yet.  She returns today for an evaluation.  REVIEW OF SYSTEMS: Out of a complete 14 system review of symptoms, the patient complains only of the following symptoms, and all other reviewed systems are negative.  ALLERGIES: Allergies  Allergen Reactions   Gluten Meal Other (See Comments)    Gluten intolerant    HOME MEDICATIONS: Outpatient  Medications Prior to Visit  Medication Sig Dispense Refill   ALPRAZolam (XANAX) 0.25 MG tablet Take 1 tablet (0.25 mg total) by mouth 2 (two) times daily as needed for anxiety. 20 tablet 0   amphetamine-dextroamphetamine (ADDERALL) 10 MG tablet Take 1.5 tablets (15 mg total) by mouth daily as needed. 45 tablet 0   betamethasone dipropionate 0.05 % cream Apply to ears nightly (Patient not taking: Reported on 06/22/2022) 45 g 2   diclofenac (VOLTAREN) 75 MG EC tablet TAKE 1 TABLET(75 MG) BY MOUTH TWICE DAILY AS NEEDED 30 tablet 1   ibandronate (BONIVA) 150 MG tablet Take 1 tablet (150 mg total) by mouth every 30 (thirty) days. Take in the morning with a full glass of water, on an empty stomach, and do not take anything else by mouth or lie down for the next 30 min. (Patient not taking: Reported on 06/22/2022) 3 tablet 3   latanoprost (XALATAN) 0.005 % ophthalmic solution Apply to eye. (Patient not taking: Reported on 06/22/2022)     Polyethyl Glycol-Propyl Glycol (SYSTANE OP) Place 1 drop into both eyes 2 (two) times daily.     QUEtiapine (SEROQUEL) 25 MG tablet 1 tab at bed time and prn 1/2 tab extra. po 135 tablet 2   tiZANidine (ZANAFLEX) 4 MG tablet Take by mouth. (Patient not taking: Reported on 06/22/2022)     triamcinolone cream (KENALOG) 0.1 % Apply 1 application topically daily. (  Patient not taking: Reported on 05/22/2021) 80 g 2   triamcinolone cream (KENALOG) 0.1 % Apply 1 application. topically 2 (two) times daily as needed. (Patient not taking: Reported on 06/22/2022) 80 g 3   valACYclovir (VALTREX) 1000 MG tablet Take 1 tablet (1,000 mg total) by mouth daily. 90 tablet 3   XIIDRA 5 % SOLN  (Patient not taking: Reported on 06/22/2022)     No facility-administered medications prior to visit.    PAST MEDICAL HISTORY: Past Medical History:  Diagnosis Date   Anxiety    Chronic insomnia    Depression    history of   Gluten intolerance 2011   Dr Marquette Saa   Keloid    Pneumonia     childhood   Seasonal allergic rhinitis    Sleep disorder    Dr Beacher May, chronic and severe insomnia   Torn ACL (anterior cruciate ligament)    left    PAST SURGICAL HISTORY: Past Surgical History:  Procedure Laterality Date   ANTERIOR CRUCIATE LIGAMENT REPAIR Right 08/2013   [name]   DILATATION & CURETTAGE/HYSTEROSCOPY WITH MYOSURE N/A 04/22/2016   Procedure: DILATATION & CURETTAGE/HYSTEROSCOPY WITH  MYOSURE;  Surgeon: Servando Salina, MD;  Location: [facility];  Service: Gynecology;  Laterality: N/A;   DILATATION & CURETTAGE/HYSTEROSCOPY WITH MYOSURE N/A 08/31/2016   Procedure: [place];  Surgeon: Servando Salina, MD;  Location: [facility];  Service: Gynecology;  Laterality: N/A;   KNEE ARTHROSCOPY Left 1995   UPPER GASTROINTESTINAL ENDOSCOPY  2008   neg; Dr Watt Climes   UPPER GASTROINTESTINAL ENDOSCOPY  2009    [facility]    FAMILY HISTORY: Family History  Problem Relation Age of Onset   Anxiety disorder Mother    Depression Mother    Hypertension Mother    Stroke Mother 49   Heart failure Mother    Heart attack Mother 71   Breast cancer Mother 66   COPD Mother        smoker   Pulmonary embolism Father 14       post rotator cuff surgery   Heart attack Paternal Grandfather 70   Tremor Sister    Tremor Sister     SOCIAL HISTORY: Social History   Socioeconomic History   Marital status: Single    Spouse name: Not on file   Number of children: 0   Years of education: College   Highest education level: Not on file  Occupational History    Employer: [employer]  Tobacco Use   Smoking status: Never   Smokeless tobacco: Never  Vaping Use   Vaping Use: Never used  Substance and Sexual Activity   Alcohol use: Yes    Alcohol/week: 7.0 standard drinks of alcohol    Types: 7 Glasses of wine per week    Comment:  socially   Drug use: No   Sexual activity: Not Currently  Other Topics Concern   Not on file  Social History Narrative    Patient is single and lives alone.     Patient has a college education.   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   Patient is right-handed.      Social Determinants of Health   Financial Resource Strain: Not on file  Food Insecurity: Not on file  Transportation Needs: Not on file  Physical Activity: Not on file  Stress:  Not on file  Social Connections: Not on file  Intimate Partner Violence: Not on file      PHYSICAL EXAM  There were no vitals filed for this visit. There is no height or weight on file to calculate BMI.  Generalized: Well developed, in no acute distress   Neurological examination  Mentation: Alert oriented to time, place, history taking. Follows all commands speech and language fluent Cranial nerve II-XII: Pupils were equal round reactive to light. Extraocular movements were full, visual field were full on confrontational test. Facial sensation and strength were normal. . Head turning and shoulder shrug  were normal and symmetric. Motor: The motor testing reveals 5 over 5 strength of all 4 extremities. Good symmetric motor tone is noted throughout.  Sensory: Sensory testing is intact to soft touch on all 4 extremities. No evidence of extinction is noted.  Coordination: Cerebellar testing reveals good finger-nose-finger and heel-to-shin bilaterally.  Mild intention tremor noted in the upper extremities Gait and station: Gait is normal. T  DIAGNOSTIC DATA (LABS, IMAGING, TESTING) - I reviewed patient records, labs, notes, testing and imaging myself where available.  Lab Results  Component Value Date   WBC 6.9 06/22/2022   HGB 14.1 06/22/2022   HCT 42.1 06/22/2022   MCV 93 06/22/2022   PLT 190 06/22/2022      Component Value Date/Time   NA 138 06/22/2022 1507   K 4.2 06/22/2022 1507   CL 103 06/22/2022 1507   CO2 21 06/22/2022 1507    GLUCOSE 86 06/22/2022 1507   GLUCOSE 76 11/22/2016 1408   BUN 23 06/22/2022 1507   CREATININE 0.77 06/22/2022 1507   CREATININE 0.76 11/22/2016 1408   CALCIUM 9.1 06/22/2022 1507   PROT 6.4 06/22/2022 1507   ALBUMIN 4.4 06/22/2022 1507   AST 21 06/22/2022 1507   ALT 18 06/22/2022 1507   ALKPHOS 58 06/22/2022 1507   BILITOT 0.4 06/22/2022 1507   GFRNONAA 97 11/08/2018 1445   GFRAA 111 11/08/2018 1445   Lab Results  Component Value Date   CHOL 227 (H) 06/22/2022   HDL 89 06/22/2022   LDLCALC 130 (H) 06/22/2022   TRIG 48 06/22/2022   CHOLHDL 2.6 06/22/2022   No results found for: "HGBA1C" No results found for: "VITAMINB12" Lab Results  Component Value Date   TSH 2.70 11/08/2014      ASSESSMENT AND PLAN 57 y.o. year old female  has a past medical history of Anxiety, Chronic insomnia, Depression, Gluten intolerance (2011), Keloid, Pneumonia, Seasonal allergic rhinitis, Sleep disorder, and Torn ACL (anterior cruciate ligament). here with:  1.  Insomnia 2.  Daytime sleepiness 3.  Essential tremor   Continue Seroquel 25 mg 1 tablet at bedtime Continue Adderall extended release 15 mg daily Tremor stable Follow-up in 6 months or sooner if needed     Ward Givens, MSN, NP-C 07/07/2022, 4:38 PM [facility] [address] [place], [place] [address] [phone]

## 2022-07-08 ENCOUNTER — Encounter: Payer: Self-pay | Admitting: Adult Health

## 2022-07-08 ENCOUNTER — Ambulatory Visit: Payer: BC Managed Care – PPO | Admitting: Adult Health

## 2022-07-08 VITALS — BP 140/84 | HR 73 | Ht <= 58 in | Wt 120.4 lb

## 2022-07-08 DIAGNOSIS — G250 Essential tremor: Secondary | ICD-10-CM

## 2022-07-08 DIAGNOSIS — F5102 Adjustment insomnia: Secondary | ICD-10-CM

## 2022-07-08 DIAGNOSIS — F5105 Insomnia due to other mental disorder: Secondary | ICD-10-CM | POA: Diagnosis not present

## 2022-07-08 MED ORDER — QUETIAPINE FUMARATE 25 MG PO TABS: 25.0000 mg | ORAL_TABLET | Freq: Every evening | ORAL | 3 refills | Status: DC | PRN

## 2022-07-08 NOTE — Patient Instructions (Signed)
Your Plan:  Continue seroquel and adderall  If your symptoms worsen or you develop new symptoms please let us know.   Thank you for coming to see [place] at [facility]. I hope we have been able to provide you high quality care today.  You may receive a patient satisfaction survey over the next few weeks. We would appreciate your feedback and comments so that we may continue to improve ourselves and the health of our patients.

## 2022-07-26 ENCOUNTER — Encounter: Payer: Self-pay | Admitting: Podiatry

## 2022-07-26 ENCOUNTER — Ambulatory Visit: Payer: BC Managed Care – PPO | Admitting: Podiatry

## 2022-07-26 DIAGNOSIS — D169 Benign neoplasm of bone and articular cartilage, unspecified: Secondary | ICD-10-CM | POA: Diagnosis not present

## 2022-07-26 DIAGNOSIS — M2041 Other hammer toe(s) (acquired), right foot: Secondary | ICD-10-CM | POA: Diagnosis not present

## 2022-07-26 NOTE — Progress Notes (Signed)
Subjective:   Patient ID: Susan Fisher, female   DOB: 57 y.o.   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  Courtesy debridement done today reappoint as symptoms indicate and again long-term surgical intervention may be necessary

## 2022-08-04 ENCOUNTER — Other Ambulatory Visit: Payer: Self-pay | Admitting: Adult Health

## 2022-08-04 ENCOUNTER — Other Ambulatory Visit: Payer: Self-pay | Admitting: Family Medicine

## 2022-08-04 MED ORDER — ALPRAZOLAM 0.25 MG PO TABS: 0.2500 mg | ORAL_TABLET | Freq: Two times a day (BID) | ORAL | 0 refills | Status: DC | PRN

## 2022-08-04 MED ORDER — AMPHETAMINE-DEXTROAMPHETAMINE 10 MG PO TABS: 15.0000 mg | ORAL_TABLET | Freq: Every day | ORAL | 0 refills | Status: DC | PRN

## 2022-08-04 NOTE — Telephone Encounter (Signed)
Refill request last apt 06/22/22 next apt 07/05/23

## 2022-08-09 ENCOUNTER — Other Ambulatory Visit: Payer: Self-pay | Admitting: Adult Health

## 2022-08-09 DIAGNOSIS — F5105 Insomnia due to other mental disorder: Secondary | ICD-10-CM

## 2022-08-09 DIAGNOSIS — F5102 Adjustment insomnia: Secondary | ICD-10-CM

## 2022-08-26 ENCOUNTER — Encounter: Payer: Self-pay | Admitting: *Deleted

## 2022-09-06 ENCOUNTER — Other Ambulatory Visit: Payer: Self-pay | Admitting: Family Medicine

## 2022-09-06 ENCOUNTER — Other Ambulatory Visit: Payer: Self-pay | Admitting: Neurology

## 2022-09-06 MED ORDER — AMPHETAMINE-DEXTROAMPHETAMINE 10 MG PO TABS: 15.0000 mg | ORAL_TABLET | Freq: Every day | ORAL | 0 refills | Status: DC | PRN

## 2022-09-06 MED ORDER — ALPRAZOLAM 0.25 MG PO TABS: 0.2500 mg | ORAL_TABLET | Freq: Two times a day (BID) | ORAL | 0 refills | Status: DC | PRN

## 2022-10-04 ENCOUNTER — Encounter: Payer: BC Managed Care – PPO | Admitting: Internal Medicine

## 2022-10-06 ENCOUNTER — Other Ambulatory Visit: Payer: Self-pay | Admitting: Neurology

## 2022-10-06 ENCOUNTER — Other Ambulatory Visit: Payer: Self-pay | Admitting: Family Medicine

## 2022-10-06 MED ORDER — AMPHETAMINE-DEXTROAMPHETAMINE 10 MG PO TABS: 15.0000 mg | ORAL_TABLET | Freq: Every day | ORAL | 0 refills | Status: DC | PRN

## 2022-10-06 MED ORDER — ALPRAZOLAM 0.25 MG PO TABS: 0.2500 mg | ORAL_TABLET | Freq: Two times a day (BID) | ORAL | 0 refills | Status: DC | PRN

## 2022-10-06 NOTE — Telephone Encounter (Signed)
Is it ok to refill? Last refilled on 09/06/2022, qty 20 with 0 refills.

## 2022-11-08 ENCOUNTER — Other Ambulatory Visit: Payer: Self-pay | Admitting: Family Medicine

## 2022-11-08 ENCOUNTER — Other Ambulatory Visit: Payer: Self-pay | Admitting: Neurology

## 2022-11-08 MED ORDER — ALPRAZOLAM 0.25 MG PO TABS: 0.2500 mg | ORAL_TABLET | Freq: Two times a day (BID) | ORAL | 0 refills | Status: DC | PRN

## 2022-11-08 MED ORDER — AMPHETAMINE-DEXTROAMPHETAMINE 10 MG PO TABS: 15.0000 mg | ORAL_TABLET | Freq: Every day | ORAL | 0 refills | Status: DC | PRN

## 2022-11-30 ENCOUNTER — Encounter: Payer: Self-pay | Admitting: Family Medicine

## 2022-11-30 ENCOUNTER — Encounter: Payer: Self-pay | Admitting: Adult Health

## 2022-11-30 DIAGNOSIS — B001 Herpesviral vesicular dermatitis: Secondary | ICD-10-CM

## 2022-11-30 MED ORDER — VALACYCLOVIR HCL 1 G PO TABS: 1000.0000 mg | ORAL_TABLET | Freq: Every day | ORAL | 3 refills | Status: DC

## 2022-12-06 ENCOUNTER — Other Ambulatory Visit: Payer: Self-pay | Admitting: Orthopedic Surgery

## 2022-12-06 DIAGNOSIS — M545 Low back pain, unspecified: Secondary | ICD-10-CM

## 2022-12-10 ENCOUNTER — Ambulatory Visit: Admission: RE | Admit: 2022-12-10 | Payer: BC Managed Care – PPO | Source: Ambulatory Visit

## 2022-12-10 DIAGNOSIS — M8580 Other specified disorders of bone density and structure, unspecified site: Secondary | ICD-10-CM | POA: Insufficient documentation

## 2022-12-14 ENCOUNTER — Other Ambulatory Visit: Payer: Self-pay | Admitting: Family Medicine

## 2022-12-14 ENCOUNTER — Other Ambulatory Visit: Payer: Self-pay | Admitting: Diagnostic Neuroimaging

## 2022-12-15 MED ORDER — ALPRAZOLAM 0.25 MG PO TABS: 0.2500 mg | ORAL_TABLET | Freq: Two times a day (BID) | ORAL | 0 refills | Status: DC | PRN

## 2022-12-15 NOTE — Telephone Encounter (Signed)
Last refilled: 11/08/2022

## 2022-12-16 MED ORDER — AMPHETAMINE-DEXTROAMPHETAMINE 10 MG PO TABS: 15.0000 mg | ORAL_TABLET | Freq: Every day | ORAL | 0 refills | Status: DC | PRN

## 2022-12-17 ENCOUNTER — Ambulatory Visit
Admission: RE | Admit: 2022-12-17 | Discharge: 2022-12-17 | Disposition: A | Discharge status: Home / Self Care | Payer: BC Managed Care – PPO | Source: Ambulatory Visit | Attending: Orthopedic Surgery | Admitting: Orthopedic Surgery

## 2022-12-17 DIAGNOSIS — M545 Low back pain, unspecified: Secondary | ICD-10-CM

## 2022-12-23 ENCOUNTER — Ambulatory Visit: Payer: BC Managed Care – PPO | Admitting: Family Medicine

## 2022-12-23 ENCOUNTER — Encounter: Payer: Self-pay | Admitting: Family Medicine

## 2022-12-23 VITALS — BP 130/84 | HR 74 | Ht 59.0 in | Wt 125.8 lb

## 2022-12-23 DIAGNOSIS — R635 Abnormal weight gain: Secondary | ICD-10-CM | POA: Diagnosis not present

## 2022-12-23 DIAGNOSIS — F411 Generalized anxiety disorder: Secondary | ICD-10-CM

## 2022-12-23 DIAGNOSIS — M1990 Unspecified osteoarthritis, unspecified site: Secondary | ICD-10-CM | POA: Diagnosis not present

## 2022-12-23 NOTE — Patient Instructions (Signed)
You can take 2 extra strength Tylenol 3 times per day as needed for pain and if that controls it no need to go any further if not then you can go with diclofenac and proper back rehab

## 2022-12-23 NOTE — Progress Notes (Signed)
   Subjective:    Patient ID: Susan Fisher, female    DOB: 1965/11/10, 57 y.o.   MRN: [id number]  HPI She is here for consult concerning multiple issues.  She has been using Voltaren for relief of musculoskeletal pain.  She describes x-ray changes in her back and neck area requiring the need of this.  She has been also taking lower doses of Tylenol.  She also recently notes a several pound gain in weight, she does workout regularly and has no major changes in her eating habits.  She also has noted intermittent difficulty with swelling and mentions 3 times over the last 83-month period of time.  She recently switched jobs.   Review of Systems     Objective:    Physical Exam Alert and in no distress.  DTRs are normal.  Tympanic membranes and canals are normal. Pharyngeal area is normal. Neck is supple without adenopathy or thyromegaly. Cardiac exam shows a regular sinus rhythm without murmurs or gallops. Lungs are clear to auscultation.        Assessment & Plan:   Problem List Items Addressed This Visit     Arthritis   Relevant Orders   CBC with Differential/Platelet   Comprehensive metabolic panel   TSH   Other Visit Diagnoses     Weight gain    -  Primary   Relevant Orders   CBC with Differential/Platelet   Comprehensive metabolic panel   TSH     I discussed the risk versus the benefit of using the Voltaren.  Encouraged her to use Tylenol first and then Voltaren is a second way to control her pain.  Discussed the risk for fluid retention, kidney damage with her.  I will do routine blood screening to ensure that there is no metabolic cause for this.  She will also keep track of the edema to see if there is a pattern to it in regards to eating or sedentary lifestyle. I then also discussed Xanax.  She does use this judiciously.  Discussed anxiety in regard to worry and stress. We will keep in touch on all these issues after I get the blood work back.

## 2022-12-24 LAB — COMPREHENSIVE METABOLIC PANEL
ALT: 21 IU/L (ref 0–32)
AST: 22 IU/L (ref 0–40)
Albumin: 4.5 g/dL (ref 3.8–4.9)
Alkaline Phosphatase: 68 IU/L (ref 44–121)
BUN/Creatinine Ratio: 20 (ref 9–23)
BUN: 14 mg/dL (ref 6–24)
Bilirubin Total: 0.3 mg/dL (ref 0.0–1.2)
CO2: 24 mmol/L (ref 20–29)
Calcium: 9.2 mg/dL (ref 8.7–10.2)
Chloride: 104 mmol/L (ref 96–106)
Creatinine, Ser: 0.7 mg/dL (ref 0.57–1.00)
Globulin, Total: 2.1 g/dL (ref 1.5–4.5)
Glucose: 89 mg/dL (ref 70–99)
Potassium: 4.6 mmol/L (ref 3.5–5.2)
Sodium: 141 mmol/L (ref 134–144)
Total Protein: 6.6 g/dL (ref 6.0–8.5)
eGFR: 101 mL/min/{1.73_m2} (ref >59)

## 2022-12-24 LAB — CBC WITH DIFFERENTIAL/PLATELET
Basophils Absolute: 0.1 10*3/uL (ref 0.0–0.2)
Basos: 1 %
EOS (ABSOLUTE): 0.1 10*3/uL (ref 0.0–0.4)
Eos: 1 %
Hematocrit: 44.1 % (ref 34.0–46.6)
Hemoglobin: 14.6 g/dL (ref 11.1–15.9)
Immature Grans (Abs): 0 10*3/uL (ref 0.0–0.1)
Immature Granulocytes: 0 %
Lymphocytes Absolute: 1.7 10*3/uL (ref 0.7–3.1)
Lymphs: 30 %
MCH: 31.3 pg (ref 26.6–33.0)
MCHC: 33.1 g/dL (ref 31.5–35.7)
MCV: 94 fL (ref 79–97)
Monocytes Absolute: 0.4 10*3/uL (ref 0.1–0.9)
Monocytes: 8 %
Neutrophils Absolute: 3.3 10*3/uL (ref 1.4–7.0)
Neutrophils: 60 %
Platelets: 210 10*3/uL (ref 150–450)
RBC: 4.67 x10E6/uL (ref 3.77–5.28)
RDW: 11.8 % (ref 11.7–15.4)
WBC: 5.5 10*3/uL (ref 3.4–10.8)

## 2022-12-24 LAB — TSH: TSH: 1.54 u[IU]/mL (ref 0.450–4.500)

## 2023-01-10 ENCOUNTER — Other Ambulatory Visit: Payer: Self-pay

## 2023-01-10 ENCOUNTER — Encounter: Payer: Self-pay | Admitting: Family Medicine

## 2023-01-10 DIAGNOSIS — M810 Age-related osteoporosis without current pathological fracture: Secondary | ICD-10-CM

## 2023-01-10 MED ORDER — IBANDRONATE SODIUM 150 MG PO TABS
150.0000 mg | ORAL_TABLET | ORAL | 0 refills | Status: DC
Start: 2023-01-10 — End: 2023-03-10

## 2023-01-15 ENCOUNTER — Other Ambulatory Visit: Payer: Self-pay | Admitting: Family Medicine

## 2023-01-15 DIAGNOSIS — M810 Age-related osteoporosis without current pathological fracture: Secondary | ICD-10-CM

## 2023-01-25 ENCOUNTER — Other Ambulatory Visit: Payer: Self-pay | Admitting: Family Medicine

## 2023-01-25 ENCOUNTER — Ambulatory Visit: Payer: BC Managed Care – PPO | Attending: Internal Medicine | Admitting: Internal Medicine

## 2023-01-25 ENCOUNTER — Other Ambulatory Visit: Payer: Self-pay | Admitting: Pharmacist

## 2023-01-25 ENCOUNTER — Other Ambulatory Visit: Payer: Self-pay | Admitting: Adult Health

## 2023-01-25 ENCOUNTER — Telehealth: Payer: Self-pay

## 2023-01-25 ENCOUNTER — Encounter: Payer: Self-pay | Admitting: Internal Medicine

## 2023-01-25 VITALS — BP 119/78 | HR 65 | Resp 14 | Ht <= 58 in | Wt 122.0 lb

## 2023-01-25 DIAGNOSIS — E559 Vitamin D deficiency, unspecified: Secondary | ICD-10-CM

## 2023-01-25 DIAGNOSIS — M1990 Unspecified osteoarthritis, unspecified site: Secondary | ICD-10-CM

## 2023-01-25 DIAGNOSIS — M8588 Other specified disorders of bone density and structure, other site: Secondary | ICD-10-CM

## 2023-01-25 DIAGNOSIS — H04123 Dry eye syndrome of bilateral lacrimal glands: Secondary | ICD-10-CM | POA: Insufficient documentation

## 2023-01-25 DIAGNOSIS — M810 Age-related osteoporosis without current pathological fracture: Secondary | ICD-10-CM | POA: Insufficient documentation

## 2023-01-25 MED ORDER — ALPRAZOLAM 0.25 MG PO TABS: 0.2500 mg | ORAL_TABLET | Freq: Two times a day (BID) | ORAL | 0 refills | Status: DC | PRN

## 2023-01-25 NOTE — Telephone Encounter (Signed)
Auth Submission: NO AUTH NEEDED Site of care: Site of care: [facility] Payer: BCBS commercial Medication & CPT/J Code(s) submitted: Reclast (Zolendronic acid) [id number] Route of submission (phone, fax, portal):  Phone # Fax # Auth type: Buy/Bill PB Units/visits requested: 5mg  x 1 dose Reference number:  Approval from: 01/25/23 to 04/26/23

## 2023-01-25 NOTE — Patient Instructions (Signed)

## 2023-01-25 NOTE — Progress Notes (Signed)
Office Visit Note  Patient: Susan Fisher             Date of Birth: 06-Jun-1965           MRN: [id number]             PCP: Ronnald Nian, MD Referring: Ronnald Nian, MD Visit Date: 01/25/2023 Occupation: [employer], PT Assistant  Subjective:  New Patient (Initial Visit) (Patient states she has joint pain in her spine because it is bone on bone. Patient states sometimes her wrists and knees hurt. )   History of Present Illness: Susan Fisher is a 57 y.o. female here for evaluation of chronic joint pain and dry eyes and mouth.  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  She sees [facility] for management including recent SI joint injection in August.  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  Had previous right knee ACL repair in 2015.  She is not on any prescription pain medication or anti-inflammatory drugs.  Previously was prescribed oral diclofenac with some benefit but not continued long term. She gets partial benefit with OTC ibuprofen and tylenol. Eye problems are chronic she has had issues with strabismus and difficulty focusing when fatigued going back to childhood.  Dry eyes have been a problem for years seeing [facility] for management.  Treatment has included artificial tears, etc., warm compresses, and procedures including lipiflow and optilight. Also has chronic dry mouth.  She has a geographic tongue appearance.  Does not get painful ulcers or sores.  Does not notice swelling along the jaw or neck. Reports sun sensitivity easily.  Usually resolves quickly unless she gets a sunburn.  No persistent rashes. She takes Boniva and vitamin D supplementation for osteopenia in the lumbar spine.  Labs reviewed 05/2022 Vit D 94.9  10/2016 ANA neg SSA, SSB neg RF neg  Activities of Daily Living:  Patient reports morning stiffness for  10-15 minutes.   Patient Reports nocturnal pain.  Difficulty dressing/grooming: Denies Difficulty climbing stairs: Denies Difficulty getting out of chair: Denies Difficulty using hands for taps, buttons, cutlery, and/or writing: Denies  Review of Systems  Constitutional:  Positive for fatigue.  HENT:  Positive for mouth dryness. Negative for mouth sores.   Eyes:  Positive for dryness.  Respiratory:  Negative for shortness of breath.   Cardiovascular:  Negative for chest pain and palpitations.  Gastrointestinal:  Negative for blood in stool, constipation and diarrhea.  Endocrine: Negative for increased urination.  Genitourinary:  Negative for involuntary urination.  Musculoskeletal:  Positive for joint pain, joint pain, myalgias, muscle weakness, morning stiffness and myalgias. Negative for gait problem, joint swelling and muscle tenderness.  Skin:  Positive for sensitivity to sunlight. Negative for color change, rash and hair loss.  Allergic/Immunologic: Positive for susceptible to infections.  Neurological:  Negative for dizziness and headaches.  Hematological:  Negative for swollen glands.  Psychiatric/Behavioral:  Positive for sleep disturbance. Negative for depressed mood. The patient is nervous/anxious.     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   Benign essential tremor 04/15/2021   Arthritis 11/30/2016   Gluten intolerance 11/07/2014   Seasonal allergies 04/04/2009  Anxiety state 10/09/2007   Disturbance in sleep behavior 10/09/2007    Past Medical History:  Diagnosis Date   Anxiety    Chronic insomnia    Depression    history of   Gluten intolerance 2011   Dr Harmon Pier   Herniated lumbar intervertebral disc    Keloid    Pneumonia    childhood   Seasonal allergic rhinitis     Sleep disorder    Dr Marylou Flesher, chronic and severe insomnia   Torn ACL (anterior cruciate ligament)    left    Family History  Problem Relation Age of Onset   Anxiety disorder Mother    Depression Mother    Hypertension Mother    Stroke Mother 25   Heart failure Mother    Heart attack Mother 18   Breast cancer Mother 25   COPD Mother        smoker   Pulmonary embolism Father 16       post rotator cuff surgery   Tremor Sister    Tremor Sister    Heart attack Paternal Grandfather 17   Insomnia Neg Hx    Past Surgical History:  Procedure Laterality Date   ANTERIOR CRUCIATE LIGAMENT REPAIR Right 08/2013   [facility]   DILATATION & CURETTAGE/HYSTEROSCOPY WITH MYOSURE N/A 04/22/2016   Procedure: DILATATION & CURETTAGE/HYSTEROSCOPY WITH  MYOSURE;  Surgeon: Maxie Better, MD;  Location: [facility];  Service: Gynecology;  Laterality: N/A;   DILATATION & CURETTAGE/HYSTEROSCOPY WITH MYOSURE N/A 08/31/2016   Procedure: DILATATION & CURETTAGE/HYSTEROSCOPY WITH MYOSURE;  Surgeon: Maxie Better, MD;  Location: [facility];  Service: Gynecology;  Laterality: N/A;   KNEE ARTHROSCOPY Left 1995   UPPER GASTROINTESTINAL ENDOSCOPY  2008   neg; Dr Ewing Schlein   UPPER GASTR[facility]INTESTINAL ENDOSCOPY  2009    [facility]   Social History   Social History Narrative   Patient is single and lives alone.     Patient has a college education.   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 -    planning to enroll with PT asst school at [employer] fall 2016   Patient drinks two caffeine drinks daily.   Patient is right-handed.      Immunization History  Administered Date(s) Administered   Hepatitis A, Adult 09/10/2014, 03/06/2015   Hepatitis A, Ped/Adol-2 Dose 09/10/2014, 03/06/2015   Hepatitis B, ADULT 11/07/2014, 12/03/2014, 03/06/2015   Hepatitis B, PED/ADOLESCENT 11/07/2014, 12/03/2014, 03/06/2015   Influenza, Seasonal, Injecte, Preservative Fre 02/28/2016   Influenza-Unspecified 01/28/2014, 02/28/2016,  02/11/2021   Moderna Covid-19 Vaccine Bivalent Booster 7yrs & up 01/22/2021   Moderna Sars-Covid-2 Vaccination 04/22/2019, 05/22/2019, 02/21/2020, 08/17/2020   PPD Test 11/12/2014, 12/03/2014, 01/10/2017   Td 03/29/2008   Tdap 11/08/2018     Objective: Vital Signs: BP 119/78 (BP Location: Right Arm, Patient Position: Sitting, Cuff Size: Normal)   Pulse 65   Resp 14   Ht 4' 9.25" (1.454 m)   Wt 122 lb (55.3 kg)   LMP  (LMP Unknown)   BMI 26.17 kg/m    Physical Exam HENT:     Mouth/Throat:     Comments: Geographic tongue Good dentition Eyes:     Comments: Mild conjunctival injection b/l, no edema  Cardiovascular:     Rate and Rhythm: Normal rate and regular rhythm.  Pulmonary:     Effort: Pulmonary effort is normal.     Breath sounds: Normal breath sounds.  Lymphadenopathy:     Cervical: No cervical adenopathy.  Skin:    General:  Skin is warm and dry.     Findings: No rash.  Neurological:     Mental Status: She is alert.  Psychiatric:        Mood and Affect: Mood normal.      Musculoskeletal Exam:  Shoulders full ROM no tenderness or swelling Elbows full ROM no tenderness or swelling Wrists full ROM no tenderness or swelling Fingers full ROM no tenderness or swelling Mild midline low back tenderness at superior border, no radiation Knees full ROM no tenderness or swelling, mild patellofemoral crepitus Ankles full ROM no tenderness or swelling   Investigation: No additional findings.  Imaging: No results found.  Recent Labs: Lab Results  Component Value Date   WBC 5.5 12/23/2022   HGB 14.6 12/23/2022   PLT 210 12/23/2022   NA 141 12/23/2022   K 4.6 12/23/2022   CL 104 12/23/2022   CO2 24 12/23/2022   GLUCOSE 89 12/23/2022   BUN 14 12/23/2022   CREATININE 0.70 12/23/2022   BILITOT 0.3 12/23/2022   ALKPHOS 68 12/23/2022   AST 22 12/23/2022   ALT 21 12/23/2022   PROT 6.6 12/23/2022   ALBUMIN 4.5 12/23/2022   CALCIUM 9.2 12/23/2022   GFRAA 111  11/08/2018    Speciality Comments: No specialty comments available.  Procedures:  No procedures performed Allergies: Gluten meal   Assessment / Plan:     Visit Diagnoses: Bilateral dry eyes - Plan: Sedimentation rate, C3 and C4, ANA, Sjogrens syndrome-A extractable nuclear antibody, Sjogrens syndrome-B extractable nuclear antibody, Rheumatoid factor, C-reactive protein  Chronic eye and mouth dryness has required numerous treatments although does not have any specific history of severe complications.  Had negative antibody screening for Sjogren's a few years ago we will recheck this today.  Also checking serum inflammatory markers rheumatoid factor and complements.  If results are nonspecific but with continuing level of symptoms could consider minor salivary gland biopsy.  Has never tried oral secretory stimulating medications for management.  Arthritis  Joint pain primarily in axial distribution and with some known degenerative arthritis appreciable synovitis on exam today.  If her lab testing is abnormal though can consider trial of hydroxychloroquine but I think she will mostly need just continued management for for osteoarthritis.  Osteopenia of spine Vitamin D deficiency - Plan: VITAMIN D 25 Hydroxy (Vit-D Deficiency, Fractures)  On vitamin D supplementation along with oral bisphosphonate for bone density last measurement reviewed from earlier this year was 94.9 which is borderline high.  Will recheck serum vitamin D level today.  Orders: Orders Placed This Encounter  Procedures   Sedimentation rate   C3 and C4   ANA   Sjogrens syndrome-A extractable nuclear antibody   Sjogrens syndrome-B extractable nuclear antibody   Rheumatoid factor   C-reactive protein   VITAMIN D 25 Hydroxy (Vit-D Deficiency, Fractures)   No orders of the defined types were placed in this encounter.    Follow-Up Instructions: Return in about 3 weeks (around 02/15/2023) for New pt ?pSS/OA/OP f/u  2-4wks.   Fuller Plan, MD  Note - This record has been created using [employer].  Chart creation errors have been sought, but may not always  have been located. Such creation errors do not reflect on  the standard of medical care.

## 2023-01-25 NOTE — Progress Notes (Signed)
Therapy plan placed for Reclast IV [phone]) for [facility] to start benefits investigation  Diagnosis: age-related osteoporosis  Provider: Dr. Sheliah Hatch  Dose: 5mg  IV every 12 months  Last Clinic Visit: 01/25/2023 Next Clinic Visit: 03/01/2023  Pertinent Labs: CMP wnl on 12/23/22 (calcium and renal function normal), Vitamin D normal on 06/22/22 (repeating today)  Chesley Mires, PharmD, MPH, BCPS, CPP Clinical Pharmacist (Rheumatology and Pulmonology)

## 2023-01-25 NOTE — Progress Notes (Signed)
Pharmacy Note  Subjective: Patient presents today to the [facility] for follow up office visit.   Patient seen by pharmacist for counseling on Reclast therapy for osteoporosis. Prior osteoporosis treatment includes: Boniva.  Objective:  T-score:  DualFemur Neck Right 12/10/2022 56.1 -2.2 0.726 g/cm2  DualFemur Neck Right 12/21/2018 52.1 -1.9 0.773 g/cm2  Lab Results  Component Value Date   VD25OH 94.9 06/22/2022   CMP     Component Value Date/Time   NA 141 12/23/2022 1536   K 4.6 12/23/2022 1536   CL 104 12/23/2022 1536   CO2 24 12/23/2022 1536   GLUCOSE 89 12/23/2022 1536   GLUCOSE 76 11/22/2016 1408   BUN 14 12/23/2022 1536   CREATININE 0.70 12/23/2022 1536   CREATININE 0.76 11/22/2016 1408   CALCIUM 9.2 12/23/2022 1536   PROT 6.6 12/23/2022 1536   ALBUMIN 4.5 12/23/2022 1536   AST 22 12/23/2022 1536   ALT 21 12/23/2022 1536   ALKPHOS 68 12/23/2022 1536   BILITOT 0.3 12/23/2022 1536   GFRNONAA 97 11/08/2018 1445   GFRAA 111 11/08/2018 1445    Osteoporosis risk factors include:  Female Post-menopausal   Assessment and Plan:  Counseled patient that Reclast is an IV bisphosphonate that reduces bone turnover by inhibiting osteoclasts that chew up bone.  Counseled patient on purpose, proper use, and adverse effects of Reclast.  Reviewed adverse events of Reclast including risk of nausea & diarrhea, headache, and muscle & bone pain.  Reviewed rare adverse effect of osteonecrosis of the jaw and advised patient to alert her dentist that she is on Reclast prior to any major dental work.  Patient confirms she does not have any major dental work scheduled at this time.    Reviewed importance of taking calcium and vitamin D with bisphosphonate therapy. Recommended daily amount of calcium is 1200mg  and vitamin D [phone] units.  Advised calcium is better obtained through diet vs supplement.  Counseled about risk of excess calcium supplementation such a kidney stones  and increased risk of heart disease.  Assessed dietary intake of calcium and patient currently eating 1 servings of dairy every other day.   Provided patient with medication education material and answered all questions. Patient agrees to trial of Reclast IV infusion 5 mg yearly at this time.  Reclast is covered 80% by Medicare Part B and the remaining 20% can be covered by a supplemental plan.  Unable to provide exact co-pay amount as it is billed through medical not pharmacy benefit.  Patient verbalized understanding.   Sofie Rower, PharmD [employer] PGY1

## 2023-01-27 ENCOUNTER — Other Ambulatory Visit: Payer: Self-pay | Admitting: Adult Health

## 2023-01-27 LAB — C-REACTIVE PROTEIN: CRP: <3 mg/L (ref <8.0)

## 2023-01-27 LAB — C3 AND C4
C3 Complement: 111 mg/dL (ref 83–193)
C4 Complement: 10 mg/dL — ABNORMAL LOW (ref 15–57)

## 2023-01-27 LAB — ANTI-NUCLEAR AB-TITER (ANA TITER): ANA Titer 1: 1:40 {titer} — ABNORMAL HIGH

## 2023-01-27 LAB — RHEUMATOID FACTOR: Rheumatoid fact SerPl-aCnc: <10 [IU]/mL (ref <14)

## 2023-01-27 LAB — VITAMIN D 25 HYDROXY (VIT D DEFICIENCY, FRACTURES): Vit D, 25-Hydroxy: 65 ng/mL (ref 30–100)

## 2023-01-27 LAB — SEDIMENTATION RATE: Sed Rate: 2 mm/h (ref 0–30)

## 2023-01-27 LAB — SJOGRENS SYNDROME-B EXTRACTABLE NUCLEAR ANTIBODY: SSB (La) (ENA) Antibody, IgG: <1 AI

## 2023-01-27 LAB — SJOGRENS SYNDROME-A EXTRACTABLE NUCLEAR ANTIBODY: SSA (Ro) (ENA) Antibody, IgG: <1 AI

## 2023-01-27 LAB — ANA: Anti Nuclear Antibody (ANA): POSITIVE — AB

## 2023-01-27 MED ORDER — AMPHETAMINE-DEXTROAMPHETAMINE 10 MG PO TABS: 15.0000 mg | ORAL_TABLET | Freq: Every day | ORAL | 0 refills | Status: DC | PRN

## 2023-01-27 NOTE — Telephone Encounter (Signed)
Requested Prescriptions   Pending Prescriptions Disp Refills   amphetamine-dextroamphetamine (ADDERALL) 10 MG tablet 45 tablet 0    Sig: Take 1.5 tablets (15 mg total) by mouth daily as needed.   LAST SEEN 07/08/22, NEXT APPT 02/15/23 ROUTING TO PROVIDER  Dispenses  07/08/22 VISIT NOTE MILLIKAN STATED: Continue Adderall extended release 15 mg daily Dispensed Days Supply Quantity Provider Pharmacy  D-AMPHETAMINE SALT COMBO 10MG  TAB 12/16/2022 30 45 each Butch Penny, NP [facility]  D-AMPHETAMINE SALT COMBO 10MG  TAB 11/08/2022 30 45 each Penumalli, Glenford Bayley, MD [facility]  D-AMPHETAMINE SALT COMBO 10MG  TAB 10/07/2022 30 45 each Dohmeier, Porfirio Mylar, MD [facility]  D-AMPHETAMINE SALT COMBO  10MG  TAB 09/06/2022 30 45 each Dohmeier, Porfirio Mylar, MD [facility]  D-AMPHETAMINE SALT COMBO 10MG  TAB 08/04/2022 30 45 each Dohmeier, Porfirio Mylar, MD [facility]  D-AMPHETAMINE SALT COMBO 10MG  TAB 07/01/2022 30 45 each Butch Penny, NP [facility]  D-AMPHETAMINE SALT COMBO 10MG  TAB 06/01/2022 30 45 each Butch Penny, NP [facility]  D-AMPHETAMINE SALT COMBO 10MG  TAB 03/30/2022 30 45 each Butch Penny, NP [facility]  D-AMPHETAMINE SALT COMBO 10MG  TAB 02/22/2022 30 45 each Butch Penny, NP [facility].Marland KitchenMarland Kitchen

## 2023-01-28 NOTE — Progress Notes (Signed)
Reclast labs wnl to proceed with infusion. Infusion is already scheduled for 02/01/2023  Chesley Mires, PharmD, MPH, BCPS, CPP Clinical Pharmacist (Rheumatology and Pulmonology)

## 2023-01-31 ENCOUNTER — Encounter: Payer: Self-pay | Admitting: Internal Medicine

## 2023-02-01 ENCOUNTER — Ambulatory Visit: Payer: BC Managed Care – PPO | Admitting: *Deleted

## 2023-02-01 VITALS — BP 150/87 | HR 63 | Temp 97.9°F | Resp 16 | Ht <= 58 in | Wt 124.8 lb

## 2023-02-01 DIAGNOSIS — M810 Age-related osteoporosis without current pathological fracture: Secondary | ICD-10-CM | POA: Diagnosis not present

## 2023-02-01 MED ORDER — ZOLEDRONIC ACID 5 MG/100ML IV SOLN
5.0000 mg | Freq: Once | INTRAVENOUS | Status: AC
  Administered 2023-02-01: 5 mg via INTRAVENOUS
  Filled 2023-02-01: qty 100

## 2023-02-01 MED ORDER — DIPHENHYDRAMINE HCL 25 MG PO CAPS
25.0000 mg | ORAL_CAPSULE | Freq: Once | ORAL | Status: AC
  Administered 2023-02-01: 25 mg via ORAL
  Filled 2023-02-01: qty 1

## 2023-02-01 MED ORDER — ACETAMINOPHEN 325 MG PO TABS
650.0000 mg | ORAL_TABLET | Freq: Once | ORAL | Status: AC
  Administered 2023-02-01: 650 mg via ORAL
  Filled 2023-02-01: qty 2

## 2023-02-01 NOTE — Progress Notes (Signed)
Diagnosis: Osteoporosis  Provider:  Chilton Greathouse MD  Procedure: IV Infusion  IV Type: Peripheral, IV Location: R Antecubital  Reclast (Zolendronic Acid), Dose: 5 mg  Infusion Start Time: 1443  pm  Infusion Stop Time: 1514 pm  Post Infusion IV Care: Observation period completed and Peripheral IV Discontinued  Discharge: Condition: Good, Destination: Home . AVS Declined  Performed by:  Wyvonne Lenz, RN

## 2023-02-15 ENCOUNTER — Ambulatory Visit: Payer: BC Managed Care – PPO | Admitting: Adult Health

## 2023-02-15 ENCOUNTER — Encounter: Payer: Self-pay | Admitting: Adult Health

## 2023-02-15 VITALS — BP 126/85 | HR 85 | Ht <= 58 in | Wt 123.2 lb

## 2023-02-15 DIAGNOSIS — G250 Essential tremor: Secondary | ICD-10-CM

## 2023-02-15 DIAGNOSIS — R400 Somnolence: Secondary | ICD-10-CM

## 2023-02-15 DIAGNOSIS — F5105 Insomnia due to other mental disorder: Secondary | ICD-10-CM | POA: Diagnosis not present

## 2023-02-15 MED ORDER — AMPHETAMINE-DEXTROAMPHETAMINE 10 MG PO TABS: 15.0000 mg | ORAL_TABLET | Freq: Every day | ORAL | Status: DC | PRN

## 2023-02-15 NOTE — Progress Notes (Signed)
PATIENT: Susan Fisher DOB: 07-24-1965  REASON FOR VISIT: follow up HISTORY FROM: patient PRIMARY NEUROLOGIST: Dr. Vickey Huger  Chief Complaint  Patient presents with   Follow-up    Pt in 4 Pt here for insomnia. Pt states insomnia same Pt wants to discuss medication management      HISTORY OF PRESENT ILLNESS: Today 02/15/23:  Susan Fisher is a 57 y.o. female with a history of essential tremor, insomnia and daytime sleepiness. Returns today for follow-up. Overall she is doing well. Remains on Adderall 15 mg daily. She reports this works well if she gets the [employer] brand. Tremor has remained stable. Remains on seroquel at bedtime and that works well. She returns today for follow-up.    07/08/22: [name] is a 57 y.o. female who has been followed in this office for essential tremor, insomnia and daytime sleepiness. Returns today for follow-up. Usually only takes 1 tablet of Seroquel at bedtime and works fairly well for sleep.  She states that there are nights that she does have trouble going to sleep.  Overall though she feels that she is doing well continues to take Adderall extended release 15 mg during the day.  Tremor is relatively stable.  Has good days and bad days.  Returns today for an evaluation  HISTORY  10/14/21   [name] is a 57 year old female with a history of essential tremor, insomnia and daytime sleepiness.  She returns today for follow-up.  She reports that she continues to have trouble sleeping and staying awake during the day.  She is taking Seroquel 1-1/2 tablets at bedtime and continues taking Adderall extended release 15 mg during the day.  She states that she usually gets anxiety when she is trying to go to sleep.  If she does not fall asleep within 15 minutes she begins to have a panic attack and that interrupts her from being able to go to sleep.  She was given a prescription for the [facility] device for tremor however she has not gotten this yet.  She returns today  for an evaluation.  REVIEW OF SYSTEMS: Out of a complete 14 system review of symptoms, the patient complains only of the following symptoms, and all other reviewed systems are negative.  ALLERGIES: Allergies  Allergen Reactions   Gluten Meal Other (See Comments)    Gluten intolerant    HOME MEDICATIONS: Outpatient Medications Prior to Visit  Medication Sig Dispense Refill   ALPRAZolam (XANAX) 0.25 MG tablet Take 1 tablet (0.25 mg total) by mouth 2 (two) times daily as needed for anxiety. 20 tablet 0   amphetamine-dextroamphetamine (ADDERALL) 10 MG tablet Take 1.5 tablets (15 mg total) by mouth daily as needed. 45 tablet 0   B Complex Vitamins (B COMPLEX PO) Take by mouth.     betamethasone dipropionate 0.05 % cream Apply to ears nightly 45 g 2   CALCIUM PO Take by mouth.     L-ARGININE PO Take by mouth.     Multiple Vitamins-Minerals (MULTIVITAMIN WITH MINERALS) tablet Take 1 tablet by mouth daily.     Omega-3 Fatty Acids (FISH OIL PO) Take by mouth.     Polyethyl Glycol-Propyl Glycol (SYSTANE OP) Place 1 drop into both eyes 2 (two) times daily.     QUEtiapine (SEROQUEL) 25 MG tablet Take 1 tablet (25 mg total) by mouth at bedtime as needed. 90 tablet 3   SELENIUM PO Take by mouth.     valACYclovir (VALTREX) 1000 MG tablet Take  1 tablet (1,000 mg total) by mouth daily. 90 tablet 3   VITAMIN D PO Take by mouth.     ibandronate (BONIVA) 150 MG tablet Take 1 tablet (150 mg total) by mouth every 30 (thirty) days. Take in the morning with a full glass of water, on an empty stomach, and do not take anything else by mouth or lie down for the next 30 min. 3 tablet 0   No facility-administered medications prior to visit.    PAST MEDICAL HISTORY: Past Medical History:  Diagnosis Date   Anxiety    Chronic insomnia    Depression    history of   Gluten intolerance 2011   Dr Harmon Pier   Herniated lumbar intervertebral disc    Keloid    Pneumonia    childhood   Seasonal allergic  rhinitis    Sleep disorder    Dr Marylou Flesher, chronic and severe insomnia   Torn ACL (anterior cruciate ligament)    left    PAST SURGICAL HISTORY: Past Surgical History:  Procedure Laterality Date   ANTERIOR CRUCIATE LIGAMENT REPAIR Right 08/2013   [facility]   DILATATION & CURETTAGE/HYSTEROSCOPY WITH MYOSURE N/A 04/22/2016   Procedure: DILATATION & CURETTAGE/HYSTEROSCOPY WITH  MYOSURE;  Surgeon: Maxie Better, MD;  Location: [facility];  Service: Gynecology;  Laterality: N/A;   DILATATION & CURETTAGE/HYSTEROSCOPY WITH MYOSURE N/A 08/31/2016   Procedure: DILATATION & CURETTAGE/HYSTEROSCOPY WITH MYOSURE;  Surgeon: Maxie Better, MD;  Location: [facility];  Service: Gynecology;  Laterality: N/A;   KNEE ARTHROSCOPY Left 1995   UPPER GASTROINTESTINAL ENDOSCOPY  2008   neg; Dr Ewing Schlein   UPPER GASTROINTESTINAL ENDOSCOPY  2009    [facility]    FAMILY HISTORY: Family History  Problem Relation Age of Onset   Anxiety disorder Mother    Depression Mother    Hypertension Mother    Stroke Mother 73   Heart failure Mother    Heart attack Mother 51   Breast cancer Mother 59   COPD Mother        smoker   Pulmonary embolism Father 79       post rotator cuff surgery   Tremor Sister    Tremor Sister    Heart attack Paternal Grandfather 37   Insomnia Neg Hx     SOCIAL HISTORY: Social History   Socioeconomic History   Marital status: Single    Spouse name: Not on file   Number of children: 0   Years of education: College   Highest education level: Not on file  Occupational History    Employer: [employer]  Tobacco Use   Smoking status: Never    Passive exposure: Past   Smokeless tobacco: Never  Vaping Use   Vaping status: Never Used  Substance and Sexual Activity   Alcohol use: Yes    Comment: occ   Drug use: No   Sexual activity: Not Currently  Other Topics Concern   Not on file  Social History Narrative   Patient is single and lives alone.     Patient has a college  education.   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 -    planning to enroll with PT asst school at [employer] fall 2016   Patient drinks two caffeine drinks daily.   Patient is right-handed.      Social Determinants of Health   Financial Resource Strain: Not on file  Food Insecurity: Not on file  Transportation Needs: Not on file  Physical Activity: Not on  file  Stress: Not on file  Social Connections: Not on file  Intimate Partner Violence: Not on file      PHYSICAL EXAM  Vitals:   02/15/23 1017  BP: 126/85  Pulse: 85  Weight: 123 lb 3.2 oz (55.9 kg)  Height: 4\' 9"  (1.448 m)   Body mass index is 26.66 kg/m.  Generalized: Well developed, in no acute distress   Neurological examination  Mentation: Alert oriented to time, place, history taking. Follows all commands speech and language fluent Cranial nerve II-XII: Pupils were equal round reactive to light. Extraocular movements were full, visual field were full on confrontational test. Facial sensation and strength were normal. . Head turning and shoulder shrug  were normal and symmetric. Motor: The motor testing reveals 5 over 5 strength of all 4 extremities. Good symmetric motor tone is noted throughout.  Sensory: Sensory testing is intact to soft touch on all 4 extremities. No evidence of extinction is noted.  Coordination: Cerebellar testing reveals good finger-nose-finger and heel-to-shin bilaterally.  Mild intention tremor noted in the upper extremities Gait and station: Gait is normal.   DIAGNOSTIC DATA (LABS, IMAGING, TESTING) - I reviewed patient records, labs, notes, testing and imaging myself where available.  Lab Results  Component Value Date   WBC 5.5 12/23/2022   HGB 14.6 12/23/2022   HCT 44.1 12/23/2022   MCV 94 12/23/2022   PLT 210 12/23/2022      Component Value Date/Time   NA 141 12/23/2022 1536   K 4.6 12/23/2022 1536   CL 104 12/23/2022 1536   CO2 24 12/23/2022 1536   GLUCOSE 89  12/23/2022 1536   GLUCOSE 76 11/22/2016 1408   BUN 14 12/23/2022 1536   CREATININE 0.70 12/23/2022 1536   CREATININE 0.76 11/22/2016 1408   CALCIUM 9.2 12/23/2022 1536   PROT 6.6 12/23/2022 1536   ALBUMIN 4.5 12/23/2022 1536   AST 22 12/23/2022 1536   ALT 21 12/23/2022 1536   ALKPHOS 68 12/23/2022 1536   BILITOT 0.3 12/23/2022 1536   GFRNONAA 97 11/08/2018 1445   GFRAA 111 11/08/2018 1445   Lab Results  Component Value Date   CHOL 227 (H) 06/22/2022   HDL 89 06/22/2022   LDLCALC 130 (H) 06/22/2022   TRIG 48 06/22/2022   CHOLHDL 2.6 06/22/2022   No results found for: "HGBA1C" No results found for: "VITAMINB12" Lab Results  Component Value Date   TSH 1.540 12/23/2022      ASSESSMENT AND PLAN 57 y.o. year old female  has a past medical history of Anxiety, Chronic insomnia, Depression, Gluten intolerance (2011), Herniated lumbar intervertebral disc, Keloid, Pneumonia, Seasonal allergic rhinitis, Sleep disorder, and Torn ACL (anterior cruciate ligament). here with:  1.  Insomnia 2.  Daytime sleepiness 3.  Essential tremor   Continue Seroquel 25 mg 1 tablet at bedtime Continue Adderall 15 mg daily Tremor stable Follow-up in 6 months or sooner if needed     Butch Penny, MSN, NP-C 02/15/2023, 10:45 AM [facility] [address] [place], [place] [address] [phone]

## 2023-02-15 NOTE — Patient Instructions (Addendum)
Your Plan:  Continue Seroquel 25 mg at bedtime Continue Adderall ER 15 mg daily  If your symptoms worsen or you develop new symptoms please let us know.       Thank you for coming to see [place] at [facility]. I hope we have been able to provide you high quality care today.  You may receive a patient satisfaction survey over the next few weeks. We would appreciate your feedback and comments so that we may continue to improve ourselves and the health of our patients.

## 2023-02-25 ENCOUNTER — Other Ambulatory Visit: Payer: Self-pay | Admitting: Family Medicine

## 2023-02-25 MED ORDER — ALPRAZOLAM 0.25 MG PO TABS: 0.2500 mg | ORAL_TABLET | Freq: Two times a day (BID) | ORAL | 0 refills | Status: DC | PRN

## 2023-02-25 NOTE — Telephone Encounter (Signed)
Last apt 12/23/22.

## 2023-03-01 ENCOUNTER — Encounter: Payer: Self-pay | Admitting: Internal Medicine

## 2023-03-01 ENCOUNTER — Encounter: Payer: Self-pay | Admitting: Adult Health

## 2023-03-01 ENCOUNTER — Ambulatory Visit: Payer: BC Managed Care – PPO | Attending: Internal Medicine | Admitting: Internal Medicine

## 2023-03-01 VITALS — BP 135/89 | HR 87 | Resp 14 | Ht <= 58 in | Wt 126.0 lb

## 2023-03-01 DIAGNOSIS — M3501 Sicca syndrome with keratoconjunctivitis: Secondary | ICD-10-CM

## 2023-03-01 DIAGNOSIS — H04123 Dry eye syndrome of bilateral lacrimal glands: Secondary | ICD-10-CM | POA: Diagnosis not present

## 2023-03-01 DIAGNOSIS — M3500 Sicca syndrome, unspecified: Secondary | ICD-10-CM | POA: Insufficient documentation

## 2023-03-01 DIAGNOSIS — M810 Age-related osteoporosis without current pathological fracture: Secondary | ICD-10-CM | POA: Diagnosis not present

## 2023-03-01 MED ORDER — HYDROXYCHLOROQUINE SULFATE 200 MG PO TABS
200.0000 mg | ORAL_TABLET | Freq: Every day | ORAL | 2 refills | Status: DC
Start: 2023-03-01 — End: 2023-05-04

## 2023-03-01 NOTE — Progress Notes (Signed)
Office Visit Note  Patient: Susan Fisher             Date of Birth: Oct 06, 1965           MRN: [id number]             PCP: Ronnald Nian, MD Referring: Ronnald Nian, MD Visit Date: 03/01/2023   Subjective:  Follow-up   History of Present Illness: Susan Fisher is a 57 y.o. female here for follow up with persistent chronic dryness and joint pains concerning for Sjogren syndrome.  Lab test showed very slightly low complement C4 and borderline ANA.  Otherwise negative for specific antibody markers.  Joint pain has been pretty much stable but had a lot of bodyaches lasting for about 5 days after starting Reclast infusion also after getting a COVID-vaccine had bodyaches for a shorter period.  Previous HPI 01/25/23 Susan Fisher is a 57 y.o. female here for evaluation of chronic joint pain and dry eyes and mouth.  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  She sees [facility] for management including recent SI joint injection in August.  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  Had previous right knee ACL repair in 2015.  She is not on any prescription pain medication or anti-inflammatory drugs.  Previously was prescribed oral diclofenac with some benefit but not continued long term. She gets partial benefit with OTC ibuprofen and tylenol. Eye problems are chronic she has had issues with strabismus and difficulty focusing when fatigued going back to childhood.  Dry eyes have been a problem for years seeing [facility] for management.  Treatment has included artificial tears, etc., warm compresses, and procedures including lipiflow and optilight. Also has chronic dry mouth.  She has a geographic tongue appearance.  Does not get painful ulcers or sores.  Does not notice swelling along the jaw or neck. Reports sun sensitivity easily.  Usually resolves quickly unless  she gets a sunburn.  No persistent rashes. She takes Boniva and vitamin D supplementation for osteopenia in the lumbar spine.   Labs reviewed 05/2022 Vit D 94.9   10/2016 ANA neg SSA, SSB neg RF neg   Review of Systems  Constitutional:  Positive for fatigue.  HENT:  Negative for mouth sores and mouth dryness.   Eyes:  Positive for dryness.  Respiratory:  Negative for shortness of breath.   Cardiovascular:  Negative for chest pain and palpitations.  Gastrointestinal:  Negative for blood in stool, constipation and diarrhea.  Endocrine: Negative for increased urination.  Genitourinary:  Negative for involuntary urination.  Musculoskeletal:  Positive for joint pain, joint pain, myalgias, morning stiffness, muscle tenderness and myalgias. Negative for gait problem, joint swelling and muscle weakness.  Skin:  Positive for sensitivity to sunlight. Negative for color change, rash and hair loss.  Allergic/Immunologic: Negative for susceptible to infections.  Neurological:  Negative for dizziness and headaches.  Hematological:  Negative for swollen glands.  Psychiatric/Behavioral:  Positive for sleep disturbance. Negative for depressed mood. The patient is nervous/anxious.     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   Benign essential  tremor 04/15/2021   Arthritis 11/30/2016   Gluten intolerance 11/07/2014   Seasonal allergies 04/04/2009   Anxiety state 10/09/2007   Disturbance in sleep behavior 10/09/2007    Past Medical History:  Diagnosis Date   Anxiety    Chronic insomnia    Depression    history of   Gluten intolerance 2011   Dr Harmon Pier   Herniated lumbar intervertebral disc    Keloid    Pneumonia     childhood   Seasonal allergic rhinitis    Sleep disorder    Dr Marylou Flesher, chronic and severe insomnia   Torn ACL (anterior cruciate ligament)    left    Family History  Problem Relation Age of Onset   Anxiety disorder Mother    Depression Mother    Hypertension Mother    Stroke Mother 68   Heart failure Mother    Heart attack Mother 32   Breast cancer Mother 69   COPD Mother        smoker   Pulmonary embolism Father 32       post rotator cuff surgery   Tremor Sister    Tremor Sister    Heart attack Paternal Grandfather 42   Insomnia Neg Hx    Past Surgical History:  Procedure Laterality Date   ANTERIOR CRUCIATE LIGAMENT REPAIR Right 08/2013   [facility]   DILATATION & CURETTAGE/HYSTEROSCOPY WITH MYOSURE N/A 04/22/2016   Procedure: DILATATION & CURETTAGE/HYSTEROSCOPY WITH  MYOSURE;  Surgeon: Maxie Better, MD;  Location: [facility];  Service: Gynecology;  Laterality: N/A;   DILATATION & CURETTAGE/HYSTEROSCOPY WITH MYOSURE N/A 08/31/2016   Procedure: DILATATION & CURETTAGE/HYSTEROSCOPY WITH MYOSURE;  Surgeon: Maxie Better, MD;  Location: [facility];  Service: Gynecology;  Laterality: N/A;   KNEE ARTHROSCOPY Left 1995   UPPER GASTROINTESTINAL ENDOSCOPY  2008   neg; Dr Ewing Schlein   UPPER GASTROINTESTINAL ENDOSCOPY  2009    [facility]   Social History   Social History Narrative   Patient is single and lives alone.     Patient has a college education.   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 -    planning to enroll with PT asst school at [employer] fall 2016   Patient drinks two caffeine drinks daily.   Patient is right-handed.      Immunization History  Administered Date(s) Administered   Hepatitis A, Adult 09/10/2014, 03/06/2015   Hepatitis A, Ped/Adol-2 Dose 09/10/2014, 03/06/2015   Hepatitis B, ADULT 11/07/2014, 12/03/2014, 03/06/2015   Hepatitis B, PED/ADOLESCENT 11/07/2014, 12/03/2014, 03/06/2015   Influenza, Seasonal, Injecte, Preservative Fre 02/28/2016    Influenza-Unspecified 01/28/2014, 02/28/2016, 02/11/2021   Moderna Covid-19 Vaccine Bivalent Booster 62yrs & up 01/22/2021   Moderna Sars-Covid-2 Vaccination 04/22/2019, 05/22/2019, 02/21/2020, 08/17/2020   PPD Test 11/12/2014, 12/03/2014, 01/10/2017   Td 03/29/2008   Tdap 11/08/2018     Objective: Vital Signs: BP 135/89 (BP Location: Right Arm, Patient Position: Sitting, Cuff Size: Normal)   Pulse 87   Resp 14   Ht 4' 9.5" (1.461 m)   Wt 126 lb (57.2 kg)   LMP  (LMP Unknown)   BMI 26.79 kg/m    Physical Exam HENT:     Mouth/Throat:     Pharynx: Oropharynx is clear.     Comments: Geographic tongue Good dentition Eyes:     Comments: Mild conjunctival injection  Cardiovascular:     Rate and Rhythm: Normal rate and regular rhythm.  Pulmonary:     Effort: Pulmonary effort is normal.  Breath sounds: Normal breath sounds.  Lymphadenopathy:     Cervical: No cervical adenopathy.  Skin:    General: Skin is warm and dry.  Neurological:     Mental Status: She is alert.  Psychiatric:        Mood and Affect: Mood normal.     Musculoskeletal Exam:  Shoulders full ROM no tenderness or swelling Wrists full ROM no tenderness or swelling Fingers full ROM no tenderness or swelling Mild midline low back tenderness at superior border, no radiation Knees full ROM no tenderness or swelling, mild patellofemoral crepitus Ankles full ROM no tenderness or swelling  Investigation: No additional findings.  Imaging: No results found.  Recent Labs: Lab Results  Component Value Date   WBC 5.5 12/23/2022   HGB 14.6 12/23/2022   PLT 210 12/23/2022   NA 141 12/23/2022   K 4.6 12/23/2022   CL 104 12/23/2022   CO2 24 12/23/2022   GLUCOSE 89 12/23/2022   BUN 14 12/23/2022   CREATININE 0.70 12/23/2022   BILITOT 0.3 12/23/2022   ALKPHOS 68 12/23/2022   AST 22 12/23/2022   ALT 21 12/23/2022   PROT 6.6 12/23/2022   ALBUMIN 4.5 12/23/2022   CALCIUM 9.2 12/23/2022   GFRAA 111  11/08/2018    Speciality Comments: No specialty comments available.  Procedures:  No procedures performed Allergies: Gluten meal   Assessment / Plan:     Visit Diagnoses: Sjogren's syndrome with keratoconjunctivitis sicca (HCC) - Plan: hydroxychloroquine (PLAQUENIL) 200 MG tablet  Discussed current findings are not completely definitive but are suggestive for Sjogren's especially with mild hypocomplementemia.  I do not recommend minor salivary gland biopsy with minimal oral involvement I think this might be low sensitivity.  Will start hydroxychloroquine 200 mg daily.  Discussed risk of medication including QT prolongation, drug intolerance, need for retinal toxicity screening.  No personal history of contraindications.  Bilateral dry eyes  Continue supportive treatment lubricating eyedrops as needed.  If not getting relief can try additional prescription treatment beyond Systane.  Starting hydroxychloroquine for suspected Sjogren's syndrome as above.  Post-menopausal osteoporosis  Now switched to Reclast infusion had transient body ache side effects otherwise doing well.  Recommend to continue with annual infusion can recheck bone density for response to treatment after 1 to 2 years.  Continue vitamin D supplementation and weightbearing exercise.  Orders: No orders of the defined types were placed in this encounter.  Meds ordered this encounter  Medications   hydroxychloroquine (PLAQUENIL) 200 MG tablet    Sig: Take 1 tablet (200 mg total) by mouth daily.    Dispense:  30 tablet    Refill:  2     Follow-Up Instructions: Return in about 2 months (around 05/01/2023) for pSS/OP HCQ start f/u 2mos.   Fuller Plan, MD  Note - This record has been created using [employer].  Chart creation errors have been sought, but may not always  have been located. Such creation errors do not reflect on  the standard of medical care.

## 2023-03-01 NOTE — Patient Instructions (Signed)
 Hydroxychloroquine Tablets What is this medication? HYDROXYCHLOROQUINE (hye drox ee KLOR oh kwin) treats autoimmune conditions, such as rheumatoid arthritis and lupus. It works by slowing down an overactive immune system. It may also be used to prevent and treat malaria. It works by killing the parasite that causes malaria. It belongs to a group of medications called DMARDs. This medicine may be used for other purposes; ask your health care provider or pharmacist if you have questions. COMMON BRAND NAME(S): Plaquenil, Quineprox, SOVUNA What should I tell my care team before I take this medication? They need to know if you have any of these conditions: Diabetes Eye disease, vision problems Frequently drink alcohol G6PD deficiency Heart disease Irregular heartbeat or rhythm Kidney disease Liver disease Porphyria Psoriasis An unusual or allergic reaction to hydroxychloroquine, other medications, foods, dyes, or preservatives Pregnant or trying to get pregnant Breastfeeding How should I use this medication? Take this medication by mouth with water. Take it as directed on the prescription label. Do not cut, crush, or chew this medication. Swallow the tablets whole. Take it with food. Do not take it more than directed. Take all of this medication unless your care team tells you to stop it early. Keep taking it even if you think you are better. Take products with antacids in them at a different time of day than this medication. Take this medication 4 hours before or 4 hours after antacids. Talk to your care team if you have questions. Talk to your care team about the use of this medication in children. While this medication may be prescribed for selected conditions, precautions do apply. Overdosage: If you think you have taken too much of this medicine contact a poison control center or emergency room at once. NOTE: This medicine is only for you. Do not share this medicine with others. What if I  miss a dose? If you miss a dose, take it as soon as you can. If it is almost time for your next dose, take only that dose. Do not take double or extra doses. What may interact with this medication? Do not take this medication with any of the following: Cisapride Dronedarone Pimozide Thioridazine This medication may also interact with the following: Ampicillin Antacids Cimetidine Cyclosporine Digoxin Kaolin Medications for diabetes, such as insulin, glipizide, glyburide Medications for seizures, such as carbamazepine, phenobarbital, phenytoin Mefloquine Methotrexate Other medications that cause heart rhythm changes Praziquantel This list may not describe all possible interactions. Give your health care provider a list of all the medicines, herbs, non-prescription drugs, or dietary supplements you use. Also tell them if you smoke, drink alcohol, or use illegal drugs. Some items may interact with your medicine. What should I watch for while using this medication? Visit your care team for regular checks on your progress. Tell your care team if your symptoms do not start to get better or if they get worse. You may need blood work done while you are taking this medication. If you take other medications that can affect heart rhythm, you may need more testing. Talk to your care team if you have questions. Your vision may be tested before and during use of this medication. Tell your care team right away if you have any change in your eyesight. This medication may cause serious skin reactions. They can happen weeks to months after starting the medication. Contact your care team right away if you notice fevers or flu-like symptoms with a rash. The rash may be red or purple and then  turn into blisters or peeling of the skin. Or, you might notice a red rash with swelling of the face, lips or lymph nodes in your neck or under your arms. If you or your family notice any changes in your behavior, such as  new or worsening depression, thoughts of harming yourself, anxiety, or other unusual or disturbing thoughts, or memory loss, call your care team right away. What side effects may I notice from receiving this medication? Side effects that you should report to your care team as soon as possible: Allergic reactions--skin rash, itching, hives, swelling of the face, lips, tongue, or throat Aplastic anemia--unusual weakness or fatigue, dizziness, headache, trouble breathing, increased bleeding or bruising Change in vision Heart rhythm changes--fast or irregular heartbeat, dizziness, feeling faint or lightheaded, chest pain, trouble breathing Infection--fever, chills, cough, or sore throat Low blood sugar (hypoglycemia)--tremors or shaking, anxiety, sweating, cold or clammy skin, confusion, dizziness, rapid heartbeat Muscle injury--unusual weakness or fatigue, muscle pain, dark yellow or brown urine, decrease in amount of urine Pain, tingling, or numbness in the hands or feet Rash, fever, and swollen lymph nodes Redness, blistering, peeling, or loosening of the skin, including inside the mouth Thoughts of suicide or self-harm, worsening mood, or feelings of depression Unusual bruising or bleeding Side effects that usually do not require medical attention (report to your care team if they continue or are bothersome): Diarrhea Headache Nausea Stomach pain Vomiting This list may not describe all possible side effects. Call your doctor for medical advice about side effects. You may report side effects to FDA at 1-800-FDA-1088. Where should I keep my medication? Keep out of the reach of children and pets. Store at room temperature up to 30 degrees C (86 degrees F). Protect from light. Get rid of any unused medication after the expiration date. To get rid of medications that are no longer needed or have expired: Take the medication to a medication take-back program. Check with your pharmacy or law  enforcement to find a location. If you cannot return the medication, check the label or package insert to see if the medication should be thrown out in the garbage or flushed down the toilet. If you are not sure, ask your care team. If it is safe to put it in the trash, empty the medication out of the container. Mix the medication with cat litter, dirt, coffee grounds, or other unwanted substance. Seal the mixture in a bag or container. Put it in the trash. NOTE: This sheet is a summary. It may not cover all possible information. If you have questions about this medicine, talk to your doctor, pharmacist, or health care provider.  2024 Elsevier/Gold Standard (2021-10-19 00:00:00)

## 2023-03-02 MED ORDER — AMPHETAMINE-DEXTROAMPHETAMINE 10 MG PO TABS: 15.0000 mg | ORAL_TABLET | Freq: Every day | ORAL | Status: DC | PRN

## 2023-03-07 MED ORDER — AMPHETAMINE-DEXTROAMPHETAMINE 10 MG PO TABS: 15.0000 mg | ORAL_TABLET | Freq: Every day | ORAL | 0 refills | Status: DC | PRN

## 2023-03-07 NOTE — Addendum Note (Signed)
Addended by: Bertram Savin on: 03/07/2023 07:46 AM   Modules accepted: Orders

## 2023-03-09 NOTE — Progress Notes (Unsigned)
Start time: End time:  Virtual Visit via Video Note  I connected with Susan Fisher on 03/09/23 by a video enabled telemedicine application and verified that I am speaking with the correct person using two identifiers.  Location: Patient: *** Provider: office   I discussed the limitations of evaluation and management by telemedicine and the availability of in person appointments. The patient expressed understanding and agreed to proceed.  History of Present Illness:  No chief complaint on file.     Observations/Objective:  LMP  (LMP Unknown)    Assessment and Plan:   Follow Up Instructions:    I discussed the assessment and treatment plan with the patient. The patient was provided an opportunity to ask questions and all were answered. The patient agreed with the plan and demonstrated an understanding of the instructions.   The patient was advised to call back or seek an in-person evaluation if the symptoms worsen or if the condition fails to improve as anticipated.  I spent *** minutes dedicated to the care of this patient, including pre-visit review of records, face to face time, post-visit ordering of testing and documentation.    Lavonda Jumbo, MD

## 2023-03-10 ENCOUNTER — Encounter: Payer: Self-pay | Admitting: Family Medicine

## 2023-03-10 ENCOUNTER — Telehealth: Payer: BC Managed Care – PPO | Admitting: Family Medicine

## 2023-03-10 VITALS — Temp 97.9°F | Ht <= 58 in | Wt 119.0 lb

## 2023-03-10 DIAGNOSIS — U071 COVID-19: Secondary | ICD-10-CM | POA: Diagnosis not present

## 2023-03-10 MED ORDER — NIRMATRELVIR/RITONAVIR (PAXLOVID)TABLET
3.0000 | ORAL_TABLET | Freq: Two times a day (BID) | ORAL | 0 refills | Status: AC
Start: 2023-03-10 — End: 2023-03-15

## 2023-03-10 NOTE — Patient Instructions (Addendum)
Stay in isolation until you are fever-free for 24 hours without the use of medications to lower your temperature (tylenol, ibuprofen, etc) and significantly improved respiratory symptoms.  Wear a mask for another 5 days once you leave isolation.  Stay well hydrated.  You can continue the theraflu if needed (recognize that it does contain acetaminophen). I don't believe that this contains guaifenesin or dextromethorphan.  Verify this, and if true, you can also take a Mucinex DM 12 hour tablet twice daily. This contains an expectorant and a cough suppressant.  Don't take the Adderall while taking the Theraflu (due to the decongestant).  You may take ibuprofen or aleve in addition, only if needed for fever or body aches.  Take the Paxlovid as directed. There is an interaction with the seroquel--it increases the dose of the seroquel. You can either skip it or cut the dose in half.  Please seek re-evaluation if you have persistent fever, worsening cough, shortness of breath, pain with breathing, chest pain, or other new concerns.

## 2023-04-05 ENCOUNTER — Encounter: Payer: Self-pay | Admitting: Internal Medicine

## 2023-04-11 ENCOUNTER — Other Ambulatory Visit: Payer: Self-pay | Admitting: Family Medicine

## 2023-04-11 ENCOUNTER — Other Ambulatory Visit: Payer: Self-pay | Admitting: Adult Health

## 2023-04-11 MED ORDER — ALPRAZOLAM 0.25 MG PO TABS: 0.2500 mg | ORAL_TABLET | Freq: Two times a day (BID) | ORAL | 0 refills | Status: DC | PRN

## 2023-04-11 MED ORDER — AMPHETAMINE-DEXTROAMPHETAMINE 10 MG PO TABS: 15.0000 mg | ORAL_TABLET | Freq: Every day | ORAL | 0 refills | Status: DC | PRN

## 2023-04-11 NOTE — Telephone Encounter (Addendum)
Pt is requesting a refill for amphetamine-dextroamphetamine (ADDERALL) 10 MG tablet .(Pt requesting brand Teva)  Pharmacy: [facility] [phone]

## 2023-04-12 ENCOUNTER — Encounter: Payer: Self-pay | Admitting: Family Medicine

## 2023-04-21 NOTE — Progress Notes (Signed)
 Office Visit Note  Patient: Susan Fisher             Date of Birth: Jan 01, 1966           MRN: [id number]             PCP: Joyce Norleen BROCKS, MD Referring: Joyce Norleen BROCKS, MD Visit Date: 05/04/2023   Subjective:  Follow-up (Patient states she took the hydroxychloroquine  for five days and then got COVID. Patient states she has not taken the medication since. Patient states she has not felt the same since. )    Discussed the use of AI scribe software for clinical note transcription with the patient, who gave verbal consent to proceed.  History of Present Illness   Susan Fisher is a 57 y.o. female here for follow up for suspected sjogren syndrome with positive ANA, chronic dry eye and mouth, and arthralgias. She also presents with ongoing symptoms following a recent COVID-19 infection. They report having taken hydroxychloroquine  for five days prior to contracting the virus and have since discontinued the medication due to concerns about its potential impact on their immune system. Since their COVID-19 infection, the patient has experienced fluctuating symptoms, feeling relatively well one day and akin to having the flu the next. These flu-like symptoms include a sensation of fever, burning eyes, nasal congestion, and general malaise. The patient denies experiencing dizziness or lightheadedness.  They report no significant changes in their mouth symptoms, which have been a longstanding issue. The patient also mentions having a herniated disc, which has been causing them to move slowly and awkwardly. They deny any significant issues with cold weather, other than a general dislike for it.  The patient's symptoms have not improved significantly since their COVID-19 infection, and they continue to experience fluctuating symptoms.    Previous HPI 03/01/2023 Susan Fisher is a 57 y.o. female here for follow up with persistent chronic dryness and joint pains concerning for Sjogren syndrome.  Lab test  showed very slightly low complement C4 and borderline ANA.  Otherwise negative for specific antibody markers.  Joint pain has been pretty much stable but had a lot of bodyaches lasting for about 5 days after starting Reclast  infusion also after getting a COVID-vaccine had bodyaches for a shorter period.   01/25/23 Susan Fisher is a 57 y.o. female here for evaluation of chronic joint pain and dry eyes and mouth.  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  She sees [facility] for management including recent SI joint injection in August.  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  Had previous right knee ACL repair in 2015.  She is not on any prescription pain medication or anti-inflammatory drugs.  Previously was prescribed oral diclofenac  with some benefit but not continued long term. She gets partial benefit with OTC ibuprofen  and tylenol . Eye problems are chronic she has had issues with strabismus and difficulty focusing when fatigued going back to childhood.  Dry eyes have been a problem for years seeing [facility] for management.  Treatment has included artificial tears, etc., warm compresses, and procedures including lipiflow and optilight. Also has chronic dry mouth.  She has a geographic tongue appearance.  Does not get painful ulcers or sores.  Does not notice swelling along the jaw or neck. Reports sun sensitivity easily.  Usually resolves quickly unless she gets a sunburn.  No  persistent rashes. She takes Boniva  and vitamin D  supplementation for osteopenia in the lumbar spine.   Labs reviewed 05/2022 Vit D 94.9   10/2016 ANA neg SSA, SSB neg RF neg   Review of Systems  Constitutional:  Positive for fatigue.  HENT:  Negative for mouth sores and mouth dryness.   Eyes:  Negative for dryness.  Respiratory:  Negative for shortness of breath.   Cardiovascular:   Negative for chest pain and palpitations.  Gastrointestinal:  Negative for blood in stool, constipation and diarrhea.  Endocrine: Negative for increased urination.  Genitourinary:  Negative for involuntary urination.  Musculoskeletal:  Positive for joint pain, joint pain, muscle weakness and morning stiffness. Negative for gait problem, joint swelling, myalgias, muscle tenderness and myalgias.  Skin:  Positive for sensitivity to sunlight. Negative for color change, rash and hair loss.  Allergic/Immunologic: Negative for susceptible to infections.  Neurological:  Negative for dizziness and headaches.  Hematological:  Negative for swollen glands.  Psychiatric/Behavioral:  Positive for sleep disturbance. Negative for depressed mood. The patient is nervous/anxious.     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   Benign essential tremor 04/15/2021   Arthritis 11/30/2016   Gluten intolerance 11/07/2014   Seasonal allergies 04/04/2009   Anxiety state 10/09/2007   Disturbance in sleep behavior 10/09/2007    Past Medical History:  Diagnosis Date   Anxiety    Chronic insomnia    Depression    history of   Gluten intolerance 2011   Dr Richardine Hurst   Herniated lumbar intervertebral disc    Keloid    Pneumonia    childhood   Seasonal allergic rhinitis    Sleep disorder    Dr Albin, chronic and severe insomnia   Torn ACL (anterior cruciate ligament)    left    Family History  Problem Relation Age of Onset   Anxiety disorder Mother    Depression Mother    Hypertension Mother    Stroke Mother 74   Heart failure Mother    Heart attack Mother 67   Breast cancer Mother 69   COPD Mother         smoker   Pulmonary embolism Father 54       post rotator cuff surgery   Tremor Sister    Tremor Sister    Heart attack Paternal Grandfather 2   Insomnia Neg Hx    Past Surgical History:  Procedure Laterality Date   ANTERIOR CRUCIATE LIGAMENT REPAIR Right 08/2013   [facility]   DILATATION & CURETTAGE/HYSTEROSCOPY WITH MYOSURE N/A 04/22/2016   Procedure: DILATATION & CURETTAGE/HYSTEROSCOPY WITH  MYOSURE;  Surgeon: Dickie Carder, MD;  Location: [facility];  Service: Gynecology;  Laterality: N/A;   DILATATION & CURETTAGE/HYSTEROSCOPY WITH MYOSURE N/A 08/31/2016   Procedure: DILATATION & CURETTAGE/HYSTEROSCOPY WITH MYOSURE;  Surgeon: Carder Dickie, MD;  Location: [facility];  Service: Gynecology;  Laterality: N/A;   KNEE ARTHROSCOPY Left 1995   UPPER GASTROINTESTINAL ENDOSCOPY  2008   neg; Dr Rosalie   UPPER GASTROINTESTINAL ENDOSCOPY  2009    [facility]   Social History   Social History Narrative   Patient is single and lives alone.     Patient has a college  education.   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 -    planning to enroll with PT asst school at [employer] fall 2016   Patient drinks two caffeine drinks daily.   Patient is right-handed.      Immunization History  Administered Date(s) Administered   Hepatitis A, Adult 09/10/2014, 03/06/2015   Hepatitis A, Ped/Adol-2 Dose 09/10/2014, 03/06/2015   Hepatitis B, ADULT 11/07/2014, 12/03/2014, 03/06/2015   Hepatitis B, PED/ADOLESCENT 11/07/2014, 12/03/2014, 03/06/2015   Influenza, Seasonal, Injecte, Preservative Fre 02/28/2016   Influenza-Unspecified 01/28/2014, 02/28/2016, 02/11/2021   Moderna Covid-19 Vaccine Bivalent Booster 56yrs & up 01/22/2021   Moderna Sars-Covid-2 Vaccination 04/22/2019, 05/22/2019, 02/21/2020, 08/17/2020   PPD Test 11/12/2014, 12/03/2014, 01/10/2017   Td 03/29/2008   Tdap 11/08/2018     Objective: Vital Signs: BP 124/83 (BP Location: Left Arm, Patient Position: Sitting, Cuff Size: Normal)    Pulse 79   Resp 12   Ht 4' 10.5 (1.486 m)   Wt 126 lb (57.2 kg)   LMP  (LMP Unknown)   BMI 25.89 kg/m    Physical Exam HENT:     Mouth/Throat:     Mouth: Mucous membranes are dry.     Pharynx: Oropharynx is clear.     Comments: Geographic tongue Eyes:     Conjunctiva/sclera: Conjunctivae normal.  Cardiovascular:     Rate and Rhythm: Normal rate and regular rhythm.  Pulmonary:     Effort: Pulmonary effort is normal.     Breath sounds: Normal breath sounds.  Musculoskeletal:     Right lower leg: No edema.     Left lower leg: No edema.  Lymphadenopathy:     Cervical: No cervical adenopathy.  Skin:    General: Skin is warm and dry.     Findings: No rash.  Neurological:     Mental Status: She is alert.  Psychiatric:        Mood and Affect: Mood normal.      Musculoskeletal Exam:  Neck full ROM no tenderness Shoulders full ROM no tenderness or swelling Elbows full ROM no tenderness or swelling Wrists full ROM no tenderness or swelling Fingers full ROM no tenderness or swelling Knees full ROM no tenderness or swelling   Investigation: No additional findings.  Imaging: No results found.  Recent Labs: Lab Results  Component Value Date   WBC 5.5 12/23/2022   HGB 14.6 12/23/2022   PLT 210 12/23/2022   NA 141 12/23/2022   K 4.6 12/23/2022   CL 104 12/23/2022   CO2 24 12/23/2022   GLUCOSE 89 12/23/2022   BUN 14 12/23/2022   CREATININE 0.70 12/23/2022   BILITOT 0.3 12/23/2022   ALKPHOS 68 12/23/2022   AST 22 12/23/2022   ALT 21 12/23/2022   PROT 6.6 12/23/2022   ALBUMIN 4.5 12/23/2022   CALCIUM 9.2 12/23/2022   GFRAA 111 11/08/2018    Speciality Comments: No specialty comments available.  Procedures:  No procedures performed Allergies: Gluten meal   Assessment / Plan:     Visit Diagnoses: Sjogren's syndrome with keratoconjunctivitis sicca (HCC) - Plan: Sedimentation rate, C3 and C4, ANA, RNP Antibody Patient has a positive ANA and low complement  C4, suggestive of an autoimmune process. Patient reports symptoms of dry eyes and nasal mucosa, which could be related to Sjogren's syndrome. However, specific antibodies for Sjogren's (Ro/SSA) were negative. - Recheck ANA, sedimentation rate, and complement levels to assess for ongoing inflammatory process. - Consider biopsy if specific symptoms or signs develop.  Bilateral  dry eyes Continuing symptom treatment with systane eyedrops.  Post-menopausal osteoporosis  Osteopenia of spine Stays very physically active, currently decreased 2/2 post COVID fatigue symptoms First reclast  infusion was on 02/01/23 switched from ibandronate . On daily vitamin D  supplement.  Post-Acute Sequelae of SARS-CoV-2 infection (PASC) Patient recovered from recent infection, treated with Paxlovid . Some residual constitutional type symptoms and malaise, burning eyes, nasal congestion, and sneezing since recovering from COVID-19. No serious respiratory complaint.    Orders: Orders Placed This Encounter  Procedures   Sedimentation rate   C3 and C4   ANA   RNP Antibody   No orders of the defined types were placed in this encounter.    Follow-Up Instructions: No follow-ups on file.   Lonni LELON Ester, MD  Note - This record has been created using [employer].  Chart creation errors have been sought, but may not always  have been located. Such creation errors do not reflect on  the standard of medical care.

## 2023-04-25 ENCOUNTER — Encounter: Payer: Self-pay | Admitting: Internal Medicine

## 2023-05-03 ENCOUNTER — Encounter: Payer: Self-pay | Admitting: Internal Medicine

## 2023-05-04 ENCOUNTER — Ambulatory Visit: Payer: 59 | Attending: Internal Medicine | Admitting: Internal Medicine

## 2023-05-04 ENCOUNTER — Encounter: Payer: Self-pay | Admitting: Internal Medicine

## 2023-05-04 VITALS — BP 124/83 | HR 79 | Resp 12 | Ht 58.5 in | Wt 126.0 lb

## 2023-05-04 DIAGNOSIS — M3501 Sicca syndrome with keratoconjunctivitis: Secondary | ICD-10-CM

## 2023-05-04 DIAGNOSIS — Z79899 Other long term (current) drug therapy: Secondary | ICD-10-CM

## 2023-05-04 DIAGNOSIS — M810 Age-related osteoporosis without current pathological fracture: Secondary | ICD-10-CM | POA: Diagnosis not present

## 2023-05-04 DIAGNOSIS — U099 Post covid-19 condition, unspecified: Secondary | ICD-10-CM

## 2023-05-04 DIAGNOSIS — M8588 Other specified disorders of bone density and structure, other site: Secondary | ICD-10-CM

## 2023-05-04 DIAGNOSIS — H04123 Dry eye syndrome of bilateral lacrimal glands: Secondary | ICD-10-CM | POA: Diagnosis not present

## 2023-05-07 LAB — RNP ANTIBODY: Ribonucleic Protein(ENA) Antibody, IgG: <1 AI

## 2023-05-07 LAB — SEDIMENTATION RATE: Sed Rate: 6 mm/h (ref 0–30)

## 2023-05-07 LAB — ANTI-NUCLEAR AB-TITER (ANA TITER): ANA Titer 1: 1:40 {titer} — ABNORMAL HIGH

## 2023-05-07 LAB — C3 AND C4
C3 Complement: 116 mg/dL (ref 83–193)
C4 Complement: 11 mg/dL — ABNORMAL LOW (ref 15–57)

## 2023-05-07 LAB — ANA: Anti Nuclear Antibody (ANA): POSITIVE — AB

## 2023-05-13 ENCOUNTER — Other Ambulatory Visit: Payer: Self-pay | Admitting: Adult Health

## 2023-05-13 MED ORDER — AMPHETAMINE-DEXTROAMPHETAMINE 10 MG PO TABS: 15.0000 mg | ORAL_TABLET | Freq: Every day | ORAL | 0 refills | Status: DC | PRN

## 2023-05-13 NOTE — Telephone Encounter (Signed)
 I have routed this request to Butch Penny, NP for review. The pt is due for the medication and [place] registry was verified.

## 2023-05-13 NOTE — Telephone Encounter (Signed)
Pt called stating that she is needing a refill on her amphetamine-dextroamphetamine (ADDERALL) 10 MG tablet and she is wanting to make sure that the manufacture is Teva Please advise.

## 2023-05-17 ENCOUNTER — Telehealth: Payer: Self-pay | Admitting: Adult Health

## 2023-05-17 MED ORDER — AMPHETAMINE-DEXTROAMPHETAMINE 10 MG PO TABS: 15.0000 mg | ORAL_TABLET | Freq: Every day | ORAL | 0 refills | Status: DC | PRN

## 2023-05-17 NOTE — Telephone Encounter (Signed)
Last seen 02/15/2023 next appt 09/15/2023.  Last fill 04-11-2024 #45.

## 2023-05-17 NOTE — Telephone Encounter (Signed)
Pt requesting that the refill for amphetamine-dextroamphetamine (ADDERALL) 10 MG tablet to be resent to [facility] #[id number]   Pt asking for refill to be resent due to [facility] are the place can get the Teva Bran for Adderall.

## 2023-05-25 ENCOUNTER — Other Ambulatory Visit: Payer: Self-pay | Admitting: Family Medicine

## 2023-05-25 NOTE — Telephone Encounter (Signed)
Last apt 12/23/22

## 2023-06-07 ENCOUNTER — Ambulatory Visit: Payer: 59 | Admitting: Podiatry

## 2023-06-07 ENCOUNTER — Encounter: Payer: Self-pay | Admitting: Podiatry

## 2023-06-07 DIAGNOSIS — D169 Benign neoplasm of bone and articular cartilage, unspecified: Secondary | ICD-10-CM

## 2023-06-07 NOTE — Progress Notes (Signed)
  Subjective:  Patient ID: Susan Fisher, female    DOB: January 14, 1966,   MRN: [id number]  Chief Complaint  Patient presents with   Ingrown Toenail    Pt presents for  Ingrown toe nail 5th toe on the right.    58 y.o. female presents for concern of pain in the right fifth toe. She has been seen before for this problem and had the area trimmed. Had discussed previously with Dr. Charlsie Merles potential surgery. Relates she has been going a year since it bothered her. Relates mostly a problem in the winter months when wearing boots . Denies any other pedal complaints. Denies n/v/f/c.   Past Medical History:  Diagnosis Date   Anxiety    Chronic insomnia    Depression    history of   Gluten intolerance 2011   Dr Harmon Pier   Herniated lumbar intervertebral disc    Keloid    Pneumonia    childhood   Seasonal allergic rhinitis    Sleep disorder    Dr Marylou Flesher, chronic and severe insomnia   Torn ACL (anterior cruciate ligament)    left    Objective:  Physical Exam: Vascular: DP/PT pulses 2/4 bilateral. CFT <3 seconds. Normal hair growth on digits. No edema.  Skin. No lacerations or abrasions bilateral feet.  Musculoskeletal: MMT 5/5 bilateral lower extremities in DF, PF, Inversion and Eversion. Deceased ROM in DF of ankle joint. Adductovarus of the fifth digit with hyperkeratotic cored lesion noted to distal lateral nail bed area. Neurological: Sensation intact to light touch.   Assessment:   1. Osteocartilaginous exostoses      Plan:  Patient was evaluated and treated and all questions answered. -Educated on corns, exostosis and hammertoes and treatment options  -Discussed padding including toe caps and crest pads. -Debridement of hyperkeratotic tissue as courtesy without incident.   -Discussed need for potential surgery if pain does not improved.  -Patient to follow-up as needed. Discussed calling if any changes or increased pain.    Louann Sjogren, DPM

## 2023-06-20 ENCOUNTER — Other Ambulatory Visit: Payer: Self-pay | Admitting: Adult Health

## 2023-06-20 NOTE — Telephone Encounter (Signed)
 Last seen 02-15-2023.  Next appt 09-15-2023. Last fill 05/17/2023 #45.

## 2023-06-20 NOTE — Telephone Encounter (Signed)
 Pt is asking that her amphetamine-dextroamphetamine (ADDERALL) 10 MG tablet (brand Teva) be called into [facility] [phone]

## 2023-06-21 ENCOUNTER — Encounter: Payer: Self-pay | Admitting: Internal Medicine

## 2023-06-21 ENCOUNTER — Other Ambulatory Visit: Payer: Self-pay | Admitting: Adult Health

## 2023-06-21 MED ORDER — AMPHETAMINE-DEXTROAMPHETAMINE 10 MG PO TABS: 15.0000 mg | ORAL_TABLET | Freq: Every day | ORAL | 0 refills | Status: DC | PRN

## 2023-07-05 ENCOUNTER — Ambulatory Visit: Payer: BC Managed Care – PPO | Admitting: Family Medicine

## 2023-07-05 ENCOUNTER — Encounter: Payer: Self-pay | Admitting: Family Medicine

## 2023-07-05 VITALS — BP 132/82 | HR 55 | Ht <= 58 in | Wt 124.0 lb

## 2023-07-05 DIAGNOSIS — M8580 Other specified disorders of bone density and structure, unspecified site: Secondary | ICD-10-CM

## 2023-07-05 DIAGNOSIS — Z0000 Encounter for general adult medical examination without abnormal findings: Secondary | ICD-10-CM | POA: Diagnosis not present

## 2023-07-05 DIAGNOSIS — Z8619 Personal history of other infectious and parasitic diseases: Secondary | ICD-10-CM

## 2023-07-05 DIAGNOSIS — M1990 Unspecified osteoarthritis, unspecified site: Secondary | ICD-10-CM

## 2023-07-05 DIAGNOSIS — G250 Essential tremor: Secondary | ICD-10-CM

## 2023-07-05 DIAGNOSIS — Z13220 Encounter for screening for lipoid disorders: Secondary | ICD-10-CM

## 2023-07-05 DIAGNOSIS — J302 Other seasonal allergic rhinitis: Secondary | ICD-10-CM

## 2023-07-05 DIAGNOSIS — K9041 Non-celiac gluten sensitivity: Secondary | ICD-10-CM

## 2023-07-05 DIAGNOSIS — G479 Sleep disorder, unspecified: Secondary | ICD-10-CM

## 2023-07-05 DIAGNOSIS — M3500 Sicca syndrome, unspecified: Secondary | ICD-10-CM

## 2023-07-05 DIAGNOSIS — F411 Generalized anxiety disorder: Secondary | ICD-10-CM | POA: Diagnosis not present

## 2023-07-05 NOTE — Progress Notes (Signed)
 Complete physical exam  Patient: Susan Fisher   DOB: July 05, 1965   58 y.o. Female  MRN: [id number]  Subjective:    Chief Complaint  Patient presents with   Annual Exam    Fasting annual exam, no concerns.     Susan Fisher is a 58 y.o. female who presents today for a complete physical exam.  She reports consuming a  regular  diet.  Exercises daily.  She generally feels well. She reports sleeping not so well.  She is followed by Dr. Vickey Huger for this and being treated with Seroquel as well as Adderall.  She is being followed by rheumatology for Sjogren's syndrome although recent lab data does indicate the possibility of lupus.  She is confused over this.  She did use hydroxychloroquine but thinks that that caused her to be susceptible to COVID which she did catch.  She still having difficulty with post-COVID symptoms of fatigue and malaise.  She is now being cared for at [facility] for her low back pain.  Apparently this is being further evaluated with an MRI in the near future.  She also was taking allergy shots to see if this will help with her overall health.  She does admit to being quite anxious and does use Xanax on an as-needed basis.  She has been involved in counseling in the past and at this point is not interested in pursuing inner further counseling to help with her anxiety.  She does see her gynecologist regularly.  Most recent fall risk assessment:    07/05/2023    9:20 AM  Fall Risk   Falls in the past year? 0  Number falls in past yr: 0  Injury with Fall? 0  Risk for fall due to : No Fall Risks  Follow up Falls evaluation completed     Most recent depression screenings:    07/05/2023    9:20 AM 06/22/2022    2:19 PM  PHQ 2/9 Scores  PHQ - 2 Score 0 1        Immunization History  Administered Date(s) Administered   Hepatitis A, Adult 09/10/2014, 03/06/2015   Hepatitis A, Ped/Adol-2 Dose 09/10/2014, 03/06/2015   Hepatitis B, ADULT 11/07/2014, 12/03/2014, 03/06/2015    Hepatitis B, PED/ADOLESCENT 11/07/2014, 12/03/2014, 03/06/2015   Influenza, Seasonal, Injecte, Preservative Fre 02/28/2016   Influenza-Unspecified 01/28/2014, 02/28/2016, 02/11/2021   Moderna Covid-19 Vaccine Bivalent Booster 55yrs & up 01/22/2021   Moderna Sars-Covid-2 Vaccination 04/22/2019, 05/22/2019, 02/21/2020, 08/17/2020   PPD Test 11/12/2014, 12/03/2014, 01/10/2017   Pfizer(Comirnaty)Fall Seasonal Vaccine 12 years and older 01/25/2023   Td 03/29/2008   Tdap 11/08/2018    Health Maintenance  Topic Date Due   HIV Screening  Never done   MAMMOGRAM  08/26/2022   COVID-19 Vaccine (7 - 2024-25 season) 07/21/2023 (Originally 03/22/2023)   INFLUENZA VACCINE  07/25/2023 (Originally 11/25/2022)   Zoster Vaccines- Shingrix (1 of 2) 10/05/2023 (Originally 10/11/1985)   Fecal DNA (Cologuard)  01/14/2025   Cervical Cancer Screening (HPV/Pap Cotest)  07/03/2025   DTaP/Tdap/Td (3 - Td or Tdap) 11/07/2028   Hepatitis C Screening  Completed   HPV VACCINES  Aged Out    Patient Care Team: Ronnald Nian, MD as PCP - General (Family Medicine) Salvatore Marvel, MD as Consulting Physician (Orthopedic Surgery) Dohmeier, Porfirio Mylar, MD (Neurology) Maxie Better, MD (Obstetrics and Gynecology) Suzi Roots (Dermatology) Janalyn Harder, MD (Inactive) as Consulting Physician (Dermatology)  Rheum:Dr. Dimple Casey  Outpatient Medications Prior to Visit  Medication Sig Note  acetaminophen (TYLENOL) 650 MG CR tablet Take 1,300 mg by mouth every 8 (eight) hours as needed for pain. 07/05/2023: Took 2 this am   amphetamine-dextroamphetamine (ADDERALL) 10 MG tablet Take 1.5 tablets (15 mg total) by mouth daily as needed.    B Complex Vitamins (B COMPLEX PO) Take by mouth.    CALCIUM PO Take by mouth. 07/05/2023: Once weekly   L-ARGININE PO Take by mouth.    Multiple Vitamins-Minerals (MULTIVITAMIN WITH MINERALS) tablet Take 1 tablet by mouth daily.    Omega-3 Fatty Acids (FISH OIL PO) Take by mouth.     Polyethyl Glycol-Propyl Glycol (SYSTANE OP) Place 1 drop into both eyes 2 (two) times daily.    QUEtiapine (SEROQUEL) 25 MG tablet Take 1 tablet (25 mg total) by mouth at bedtime as needed.    SELENIUM PO Take by mouth.    valACYclovir (VALTREX) 1000 MG tablet Take 1 tablet (1,000 mg total) by mouth daily.    VITAMIN D PO Take by mouth.    ALPRAZolam (XANAX) 0.25 MG tablet TAKE 1 TABLET(0.25 MG) BY MOUTH TWICE DAILY AS NEEDED FOR ANXIETY (Patient not taking: Reported on 07/05/2023) 07/05/2023: As needed   betamethasone dipropionate 0.05 % cream Apply to ears nightly (Patient not taking: Reported on 07/05/2023) 07/05/2023: As needed   [DISCONTINUED] Chlorphen-Pseudoephed-APAP (THERAFLU FLU/COLD PO) Take 1 Package by mouth as needed. (Patient not taking: Reported on 05/04/2023)    No facility-administered medications prior to visit.    Review of Systems  All other systems reviewed and are negative.   Family and social history as well as health maintenance and immunizations was reviewed.     Objective:    BP 132/82   Pulse (!) 55   Ht 4\' 10"  (1.473 m)   Wt 124 lb (56.2 kg)   LMP  (LMP Unknown)   BMI 25.92 kg/m    Physical Exam  Alert and in no distress. Tympanic membranes and canals are normal. Pharyngeal area is normal. Neck is supple without adenopathy or thyromegaly. Cardiac exam shows a regular sinus rhythm without murmurs or gallops. Lungs are clear to auscultation.      Assessment & Plan:     Routine general medical examination at a health care facility  Anxiety state  Arthritis  Benign essential tremor  Disturbance in sleep behavior  H/O herpes labialis  Osteopenia, unspecified location  Seasonal allergies  Sjogren's syndrome, with unspecified organ involvement (HCC)  Screening for lipid disorders - Plan: Lipid panel  And Shingrix and pneumonia shot although she deferred this.  She will continue on present medication regimen.  I will continue to give her  Xanax.  She will follow-up with rheumatology concerning her concerns over exactly what diagnosis she has and potential therapies.  Had a long discussion with her concerning Sjogren syndrome versus lupus and the fact that sometimes we treat based on clinical experience and not necessarily lab data. Return in about 1 year (around 07/04/2024).      Sharlot Gowda, MD

## 2023-07-06 ENCOUNTER — Encounter: Payer: Self-pay | Admitting: Family Medicine

## 2023-07-06 LAB — LIPID PANEL
Chol/HDL Ratio: 2.2 ratio (ref 0.0–4.4)
Cholesterol, Total: 192 mg/dL (ref 100–199)
HDL: 86 mg/dL (ref >39)
LDL Chol Calc (NIH): 93 mg/dL (ref 0–99)
Triglycerides: 72 mg/dL (ref 0–149)
VLDL Cholesterol Cal: 13 mg/dL (ref 5–40)

## 2023-07-25 ENCOUNTER — Other Ambulatory Visit: Payer: Self-pay | Admitting: Adult Health

## 2023-07-27 MED ORDER — AMPHETAMINE-DEXTROAMPHETAMINE 10 MG PO TABS: 15.0000 mg | ORAL_TABLET | Freq: Every day | ORAL | 0 refills | Status: DC | PRN

## 2023-07-27 NOTE — Telephone Encounter (Signed)
 Pt last seen 02/15/23 by MM,NP and next f/u 09/15/23 with MM,NP.   Last refilled 06/22/23 #45.

## 2023-07-27 NOTE — Telephone Encounter (Signed)
 Pt called wanting to know when this will be filled for her. She also stated that she is needing the one that goes by the name of Teva.

## 2023-07-27 NOTE — Telephone Encounter (Signed)
 Called pt and informed her rx refill sent in as requested. She verbalized understanding.

## 2023-07-28 ENCOUNTER — Encounter: Payer: Self-pay | Admitting: Adult Health

## 2023-08-02 ENCOUNTER — Other Ambulatory Visit: Payer: Self-pay | Admitting: Family Medicine

## 2023-08-23 ENCOUNTER — Telehealth: Payer: Self-pay | Admitting: Adult Health

## 2023-08-23 DIAGNOSIS — F5102 Adjustment insomnia: Secondary | ICD-10-CM

## 2023-08-23 DIAGNOSIS — F5105 Insomnia due to other mental disorder: Secondary | ICD-10-CM

## 2023-08-24 NOTE — Telephone Encounter (Signed)
Pt called wanting to know when this will be called in for her. Please advise.  

## 2023-08-25 NOTE — Telephone Encounter (Signed)
 Pt called wanting to know when this will be called in. Pt is completely out.

## 2023-09-05 ENCOUNTER — Other Ambulatory Visit: Payer: Self-pay | Admitting: Adult Health

## 2023-09-05 MED ORDER — AMPHETAMINE-DEXTROAMPHETAMINE 10 MG PO TABS
15.0000 mg | ORAL_TABLET | Freq: Every day | ORAL | 0 refills | Status: DC | PRN
Start: 2023-09-05 — End: 2023-10-05

## 2023-09-05 NOTE — Telephone Encounter (Signed)
 Last visit: 02/15/23 Next visit: 09/15/23 Last fills:    From last visit:

## 2023-09-05 NOTE — Telephone Encounter (Signed)
 Pt is requesting a refill for amphetamine -dextroamphetamine  (ADDERALL) 10 MG tablet.  Pharmacy: [facility] [phone]

## 2023-09-15 ENCOUNTER — Telehealth: Payer: BC Managed Care – PPO | Admitting: Adult Health

## 2023-09-15 DIAGNOSIS — F5105 Insomnia due to other mental disorder: Secondary | ICD-10-CM | POA: Diagnosis not present

## 2023-09-15 DIAGNOSIS — R400 Somnolence: Secondary | ICD-10-CM

## 2023-09-15 DIAGNOSIS — G250 Essential tremor: Secondary | ICD-10-CM

## 2023-09-15 NOTE — Progress Notes (Signed)
 PATIENT: Susan Fisher DOB: 02-17-1966  REASON FOR VISIT: follow up HISTORY FROM: patient  Virtual Visit via Video Note  I connected with Fidencio Hue on 09/15/23 at  8:15 AM EDT by a video enabled telemedicine application located remotely at [facility] and verified that I am speaking with the correct person using two identifiers who was located at their own car located in [place].   I discussed the limitations of evaluation and management by telemedicine and the availability of in person appointments. The patient expressed understanding and agreed to proceed.   PATIENT: Susan Fisher DOB: 12-15-1965  REASON FOR VISIT: follow up HISTORY FROM: patient  HISTORY OF PRESENT ILLNESS: Today 09/15/23 Susan Fisher is a 58 y.o. female with a history of essential tremor, insomnia and daytime sleepiness. Returns today for follow-up.  She reports that Adderall 50 mg daily is working well as long as she gets TEVA brand.  Reports that tremor has remained stable.  She continues to take Seroquel  at bedtime but does note that she is still having some trouble with insomnia.  States that if she is unable to fall asleep within 10 to 15 minutes she starts having anxiety that can end in a panic attack.  Reports that she has seen therapist in the past.  But has never seen psychiatry.  Has never done any cognitive behavioral therapy.  Reports that the only new medical history is an abnormal Pap smear.  She has been followed by [facility].  She returns today for an evaluation.   02/15/23: Susan Fisher is a 58 y.o. female with a history of essential tremor, insomnia and daytime sleepiness. Returns today for follow-up. Overall she is doing well. Remains on Adderall 15 mg daily. She reports this works well if she gets the TEVA brand. Tremor has remained stable. Remains on seroquel  at bedtime and that works well. She returns today for follow-up.    07/08/22: Susan Fisher is a 58 y.o. female who has been  followed in this office for essential tremor, insomnia and daytime sleepiness. Returns today for follow-up. Usually only takes 1 tablet of Seroquel  at bedtime and works fairly well for sleep.  She states that there are nights that she does have trouble going to sleep.  Overall though she feels that she is doing well continues to take Adderall extended release 15 mg during the day.  Tremor is relatively stable.  Has good days and bad days.  Returns today for an evaluation  HISTORY  10/14/21   Susan Fisher is a 58 year old female with a history of essential tremor, insomnia and daytime sleepiness.  She returns today for follow-up.  She reports that she continues to have trouble sleeping and staying awake during the day.  She is taking Seroquel  1-1/2 tablets at bedtime and continues taking Adderall extended release 15 mg during the day.  She states that she usually gets anxiety when she is trying to go to sleep.  If she does not fall asleep within 15 minutes she begins to have a panic attack and that interrupts her from being able to go to sleep.  She was given a prescription for the [employer] device for tremor however she has not gotten this yet.  She returns today for an evaluation.   HISTORY   REVIEW OF SYSTEMS: Out of a complete 14 system review of symptoms, the patient complains only of the following symptoms, and all other reviewed systems are negative.  ALLERGIES: Allergies  Allergen Reactions   Gluten Meal Other (See Comments)    Gluten intolerant    HOME MEDICATIONS: Outpatient Medications Prior to Visit  Medication Sig Dispense Refill   acetaminophen  (TYLENOL ) 650 MG CR tablet Take 1,300 mg by mouth every 8 (eight) hours as needed for pain.     ALPRAZolam  (XANAX ) 0.25 MG tablet TAKE 1 TABLET(0.25 MG) BY MOUTH TWICE DAILY AS NEEDED FOR ANXIETY 30 tablet 1   amphetamine -dextroamphetamine  (ADDERALL) 10 MG tablet Take 1.5 tablets (15 mg total) by mouth daily as needed. 45 tablet 0   B Complex  Vitamins (B COMPLEX PO) Take by mouth.     betamethasone  dipropionate 0.05 % cream Apply to ears nightly (Patient not taking: Reported on 07/05/2023) 45 g 2   CALCIUM PO Take by mouth.     L-ARGININE PO Take by mouth.     Multiple Vitamins-Minerals (MULTIVITAMIN WITH MINERALS) tablet Take 1 tablet by mouth daily.     Omega-3 Fatty Acids (FISH OIL PO) Take by mouth.     Polyethyl Glycol-Propyl Glycol (SYSTANE OP) Place 1 drop into both eyes 2 (two) times daily.     QUEtiapine  (SEROQUEL ) 25 MG tablet TAKE 1 TABLET(25 MG) BY MOUTH AT BEDTIME AS NEEDED 90 tablet 3   SELENIUM PO Take by mouth.     valACYclovir  (VALTREX ) 1000 MG tablet Take 1 tablet (1,000 mg total) by mouth daily. 90 tablet 3   VITAMIN D  PO Take by mouth.     No facility-administered medications prior to visit.    PAST MEDICAL HISTORY: Past Medical History:  Diagnosis Date   Anxiety    Chronic insomnia    Depression    history of   Gluten intolerance 2011   Dr Nira Basset   Herniated lumbar intervertebral disc    Keloid    Pneumonia    childhood   Seasonal allergic rhinitis    Sleep disorder    Dr Dia Forget, chronic and severe insomnia   Torn ACL (anterior cruciate ligament)    left    PAST SURGICAL HISTORY: Past Surgical History:  Procedure Laterality Date   ANTERIOR CRUCIATE LIGAMENT REPAIR Right 08/2013   [facility]   DILATATION & CURETTAGE/HYSTEROSCOPY WITH MYOSURE N/A 04/22/2016   Procedure: DILATATION & CURETTAGE/HYSTEROSCOPY WITH  MYOSURE;  Surgeon: Ivery Marking, MD;  Location: [facility];  Service: Gynecology;  Laterality: N/A;   DILATATION & CURETTAGE/HYSTEROSCOPY WITH MYOSURE N/A 08/31/2016   Procedure: DILATATION & CURETTAGE/HYSTEROSCOPY WITH MYOSURE;  Surgeon: Ivery Marking, MD;  Location: [facility];  Service: Gynecology;  Laterality: N/A;   KNEE ARTHROSCOPY Left 1995   UPPER GASTROINTESTINAL ENDOSCOPY  2008   neg; Dr Lavaughn [place]   UPPER GASTROINTESTINAL ENDOSCOPY  2009    [facility]    FAMILY  HISTORY: Family History  Problem Relation Age of Onset   Anxiety disorder Mother    Depression Mother    Hypertension Mother    Stroke Mother 61   Heart failure Mother    Heart attack Mother 75   Breast cancer Mother 77   COPD Mother        smoker   Pulmonary embolism Father 50       post rotator cuff surgery   Tremor Sister    Tremor Sister    Heart attack Paternal Grandfather 61   Insomnia Neg Hx     SOCIAL HISTORY: Social History   Socioeconomic History   Marital status: Single    Spouse name: Not on file   Number  of children: 0   Years of education: College   Highest education level: Not on file  Occupational History    Employer: [employer]  Tobacco Use   Smoking status: Never    Passive exposure: Past   Smokeless tobacco: Never  Vaping Use   Vaping status: Never Used  Substance and Sexual Activity   Alcohol use: Yes    Comment: occ   Drug use: No   Sexual activity: Not Currently  Other Topics Concern   Not on file  Social History Narrative   Patient is single and lives alone.     Patient has a college education.   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 -    planning to enroll with PT asst school at [employer] fall 2016   Patient drinks two caffeine drinks daily.   Patient is right-handed.      Social Drivers of Corporate investment banker Strain: Low Risk  (09/12/2023)   Received from [facility]   Overall Financial Resource Strain (CARDIA)    Difficulty of Paying Living Expenses: Not hard at all  Food Insecurity: No Food Insecurity (09/12/2023)   Received from [facility]   Hunger Vital Sign    Worried About Running Out of Food in the Last Year: Never true    Ran Out of Food in the Last Year: Never true  Transportation Needs: No Transportation Needs (09/12/2023)   Received from [facility] - Transportation    In the past 12 months, has lack of transportation kept you from  medical appointments or from getting medications?: No    Lack of Transportation (Non-Medical): No  Physical Activity: Not on file  Stress: Not on file  Social Connections: Not on file  Intimate Partner Violence: Not on file      PHYSICAL EXAM Generalized: Well developed, in no acute distress   Neurological examination  Mentation: Alert oriented to time, place, history taking. Follows all commands speech and language fluent Cranial nerve II-XII:Extraocular movements were full. Facial symmetry noted. uvula tongue midline. Head turning and shoulder shrug  were normal and symmetric. Motor: Good strength throughout subjectively per patient Sensory: Sensory testing is intact to soft touch on all 4 extremities subjectively per patient Coordination: Cerebellar testing reveals good finger-nose-finger  Gait and station: Patient is able to stand from a seated position. gait is normal.  Reflexes: UTA  DIAGNOSTIC DATA (LABS, IMAGING, TESTING) - I reviewed patient records, labs, notes, testing and imaging myself where available.  Lab Results  Component Value Date   WBC 5.5 12/23/2022   HGB 14.6 12/23/2022   HCT 44.1 12/23/2022   MCV 94 12/23/2022   PLT 210 12/23/2022      Component Value Date/Time   NA 141 12/23/2022 1536   K 4.6 12/23/2022 1536   CL 104 12/23/2022 1536   CO2 24 12/23/2022 1536   GLUCOSE 89 12/23/2022 1536   GLUCOSE 76 11/22/2016 1408   BUN 14 12/23/2022 1536   CREATININE 0.70 12/23/2022 1536   CREATININE 0.76 11/22/2016 1408   CALCIUM 9.2 12/23/2022 1536   PROT 6.6 12/23/2022 1536   ALBUMIN 4.5 12/23/2022 1536   AST 22 12/23/2022 1536   ALT 21 12/23/2022 1536   ALKPHOS 68 12/23/2022 1536   BILITOT 0.3 12/23/2022 1536   GFRNONAA 97 11/08/2018 1445   GFRAA 111 11/08/2018 1445   Lab Results  Component Value Date   CHOL 192 07/05/2023  HDL 86 07/05/2023   LDLCALC 93 07/05/2023   TRIG 72 07/05/2023   CHOLHDL 2.2 07/05/2023   No results found for:  "HGBA1C" No results found for: "VITAMINB12" Lab Results  Component Value Date   TSH 1.540 12/23/2022      ASSESSMENT AND PLAN 58 y.o. year old female  has a past medical history of Anxiety, Chronic insomnia, Depression, Gluten intolerance (2011), Herniated lumbar intervertebral disc, Keloid, Pneumonia, Seasonal allergic rhinitis, Sleep disorder, and Torn ACL (anterior cruciate ligament). here with:   1.  Insomnia 2.  Daytime sleepiness 3.  Essential tremor   Continue Seroquel  25 mg 1 tablet at bedtime Continue Adderall 15 mg daily Patient is interested in referral to psychiatry for potential cognitive behavioral therapy for insomnia Tremor stable Follow-up in 6 months or sooner if needed     Clem Currier, MSN, NP-C 09/15/2023, 10:20 AM [facility] [address] [place], [place] [address] [phone]

## 2023-09-15 NOTE — Patient Instructions (Signed)
 Your Plan:  Continue Seroquel  25 mg 1 tablet at bedtime Continue Adderall 15 mg daily  referral to psychiatry for potential cognitive behavioral therapy for insomnia   Thank you for coming to see us  at [facility]. I hope we have been able to provide you high quality care today.  You may receive a patient satisfaction survey over the next few weeks. We would appreciate your feedback and comments so that we may continue to improve ourselves and the health of our patients.

## 2023-09-20 ENCOUNTER — Telehealth: Payer: Self-pay | Admitting: Adult Health

## 2023-09-20 ENCOUNTER — Encounter: Payer: Self-pay | Admitting: Adult Health

## 2023-09-20 DIAGNOSIS — F5105 Insomnia due to other mental disorder: Secondary | ICD-10-CM

## 2023-09-20 DIAGNOSIS — F5102 Adjustment insomnia: Secondary | ICD-10-CM

## 2023-09-20 NOTE — Telephone Encounter (Signed)
 Referral to Psychiatry faxed to [facility] for cognitive behavioral therapy for insomnia  Phone # [phone] Fax # [phone]

## 2023-10-05 ENCOUNTER — Other Ambulatory Visit: Payer: Self-pay | Admitting: Adult Health

## 2023-10-05 MED ORDER — AMPHETAMINE-DEXTROAMPHETAMINE 10 MG PO TABS: 15.0000 mg | ORAL_TABLET | Freq: Every day | ORAL | 0 refills | Status: DC | PRN

## 2023-10-05 NOTE — Telephone Encounter (Signed)
 Last visit: 09/15/23 Next visit: 05/09/24 Last fills:   Per last note:

## 2023-10-05 NOTE — Telephone Encounter (Signed)
 Pt called to request Medication refill amphetamine -dextroamphetamine  (ADDERALL) 10 MG tablet  Pt would like medication to be sent to :   [id number] - [place], [place] - [address] (Ph: [phone])

## 2023-10-17 ENCOUNTER — Other Ambulatory Visit: Payer: Self-pay | Admitting: Family Medicine

## 2023-10-17 NOTE — Telephone Encounter (Signed)
 Last apt 07/05/23.
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
## 2023-11-04 HISTORY — PX: DILATION AND CURETTAGE OF UTERUS: SHX78

## 2023-11-04 NOTE — Progress Notes (Signed)
Discharge instructions reviewed with patient and responsible party. All questions and concerns answered at this time. VSS. Pt discharged to home with responsible party and all personal belongings secured with patient.

## 2023-11-04 NOTE — Interval H&P Note (Signed)
 The H&P has been reviewed and the patient has been examined. There is no change in the overall  assessment and no contraindication for surgery.

## 2023-11-04 NOTE — Procedures (Signed)
 Operative Note   SURGERY DATE: 11/04/2023  PRE-OP DIAGNOSIS: Thickened endometrium [R93.89]    POST-OP DIAGNOSIS: Post-Op Diagnosis Codes:    * Thickened endometrium [R93.89]   Procedure(s): Diagnostic Hysteroscopy SURGEON: Surgeons and Role:    * Mliss Tita Schanz, MD - Primary  ASSISTANT(S): N/A  STAFF: Circulator: Samaco, Dorothyann Lister, RN Scrub Person: Loftin, Johnette  ANESTHESIA: Monitored Anesthesia Care   INDICATION(S): PMB  OPERATIVE FINDINGS: atrophic vaginal changes , closed endocervical canal with cervix flush with vagina.   Uterine perforation at the fundus left side,   OPERATIVE REPORT:    After informed consent was obtained, the patient was taken to the operating room where anesthesia was obtained without difficulty. The patient was positioned in the dorsal lithotomy position in [place] stirrups.  Time-out was performed. The patient was examined under anesthesia, and the above findings were noted. She was prepped and draped in normal sterile fashion.  The patient's bladder was catheterized with an in and out catheter.  A bivalved speculum was placed inside the patient's vagina. The anterior lip of the cervix was visualized and grasped with a single-toothed tenaculum. Cervical os noted to be closed and scarred down. Using an 11 french size dilator , I attempted to open the cervix, with steady pressure the endocervical canal open and a pop was felt. The cervix was then progressively dilated up to a size 21-French Pratt dilator, but at this time suspicious for possible perforation. The diagnostic hysteroscope was introduced into the uterine cavity the cavity was noted to be atrophic and scarred down. A uterine perforation noted at the fundus towards the left. Procedure terminated.  Excellent hemostasis was noted. The speculum and all other instruments were removed from the patient's vagina. The patient tolerated the procedure well.  Sponge, lap and needle counts were  correct x2.  The patient was taken to recovery room in excellent condition.   ESTIMATED BLOOD LOSS: minimal   * No values recorded between 11/04/2023 11:31 AM and 11/04/2023 11:38 AM *      TOTAL IV FLUIDS: Crystalloid 300 ml  SPECIMENS:  * No specimens in log *   IMPLANTS:  * No implants in log *   COMPLICATIONS: Uterine perforation, procedure aborted.   DISPOSITION: PACU - hemodynamically stable.  ATTESTATION:  TP- Surgery (W/O Resident) - I performed the entire procedure w/o resident involvement (TP).  ALEJANDRO JOSE LANDA, MD

## 2023-11-05 ENCOUNTER — Emergency Department (HOSPITAL_COMMUNITY)

## 2023-11-05 ENCOUNTER — Encounter (HOSPITAL_COMMUNITY): Payer: Self-pay | Admitting: *Deleted

## 2023-11-05 ENCOUNTER — Other Ambulatory Visit: Payer: Self-pay

## 2023-11-05 ENCOUNTER — Emergency Department (HOSPITAL_COMMUNITY)
Admission: EM | Admit: 2023-11-05 | Discharge: 2023-11-05 | Disposition: A | Discharge status: Home / Self Care | Attending: Emergency Medicine | Admitting: Emergency Medicine

## 2023-11-05 DIAGNOSIS — S3769XA Other injury of uterus, initial encounter: Secondary | ICD-10-CM

## 2023-11-05 DIAGNOSIS — R1084 Generalized abdominal pain: Secondary | ICD-10-CM | POA: Insufficient documentation

## 2023-11-05 DIAGNOSIS — N9971 Accidental puncture and laceration of a genitourinary system organ or structure during a genitourinary system procedure: Secondary | ICD-10-CM | POA: Diagnosis present

## 2023-11-05 DIAGNOSIS — R140 Abdominal distension (gaseous): Secondary | ICD-10-CM | POA: Diagnosis not present

## 2023-11-05 LAB — COMPREHENSIVE METABOLIC PANEL WITH GFR
ALT: 14 U/L (ref 0–44)
AST: 22 U/L (ref 15–41)
Albumin: 3.7 g/dL (ref 3.5–5.0)
Alkaline Phosphatase: 40 U/L (ref 38–126)
Anion gap: 8 (ref 5–15)
BUN: 11 mg/dL (ref 6–20)
CO2: 22 mmol/L (ref 22–32)
Calcium: 8.8 mg/dL — ABNORMAL LOW (ref 8.9–10.3)
Chloride: 111 mmol/L (ref 98–111)
Creatinine, Ser: 0.73 mg/dL (ref 0.44–1.00)
GFR, Estimated: >60 mL/min (ref >60)
Glucose, Bld: 115 mg/dL — ABNORMAL HIGH (ref 70–99)
Potassium: 4.7 mmol/L (ref 3.5–5.1)
Sodium: 141 mmol/L (ref 135–145)
Total Bilirubin: 0.9 mg/dL (ref 0.0–1.2)
Total Protein: 6.2 g/dL — ABNORMAL LOW (ref 6.5–8.1)

## 2023-11-05 LAB — CBC
HCT: 41 % (ref 36.0–46.0)
Hemoglobin: 13.5 g/dL (ref 12.0–15.0)
MCH: 31.1 pg (ref 26.0–34.0)
MCHC: 32.9 g/dL (ref 30.0–36.0)
MCV: 94.5 fL (ref 80.0–100.0)
Platelets: 248 K/uL (ref 150–400)
RBC: 4.34 MIL/uL (ref 3.87–5.11)
RDW: 12.9 % (ref 11.5–15.5)
WBC: 13.1 K/uL — ABNORMAL HIGH (ref 4.0–10.5)
nRBC: 0 % (ref 0.0–0.2)

## 2023-11-05 LAB — URINALYSIS, ROUTINE W REFLEX MICROSCOPIC
Bilirubin Urine: NEGATIVE
Glucose, UA: NEGATIVE mg/dL
Ketones, ur: NEGATIVE mg/dL
Nitrite: NEGATIVE
Protein, ur: 30 mg/dL — AB
Specific Gravity, Urine: 1.034 — ABNORMAL HIGH (ref 1.005–1.030)
pH: 5 (ref 5.0–8.0)

## 2023-11-05 LAB — LIPASE, BLOOD: Lipase: 43 U/L (ref 11–51)

## 2023-11-05 MED ORDER — HYDROMORPHONE HCL 1 MG/ML IJ SOLN
0.5000 mg | INTRAMUSCULAR | Status: DC | PRN
  Administered 2023-11-05 (×2): 0.5 mg via INTRAVENOUS
  Filled 2023-11-05 (×3): qty 1

## 2023-11-05 MED ORDER — SODIUM CHLORIDE 0.9 % IV SOLN: Freq: Once | INTRAVENOUS | Status: AC

## 2023-11-05 MED ORDER — IOHEXOL 350 MG/ML SOLN
75.0000 mL | Freq: Once | INTRAVENOUS | Status: AC | PRN
  Administered 2023-11-05: 75 mL via INTRAVENOUS

## 2023-11-05 MED ORDER — OXYCODONE-ACETAMINOPHEN 5-325 MG PO TABS
1.0000 | ORAL_TABLET | Freq: Once | ORAL | Status: AC
  Administered 2023-11-05: 1 via ORAL
  Filled 2023-11-05: qty 1

## 2023-11-05 NOTE — ED Notes (Signed)
 Patient returned from CT

## 2023-11-05 NOTE — ED Triage Notes (Signed)
 The pt had a surgical procedure  at [facility] today  the pt  reports that her uterus was perforated  she has had vaginal spotting

## 2023-11-05 NOTE — ED Triage Notes (Signed)
 The pt was sent to [facility] for her back pain  initially

## 2023-11-05 NOTE — ED Provider Notes (Signed)
 Pt was accepted by Dr. Mliss with gynecology.  He wants pt sent by POV.  Discussed this with the pt and her driver.  They are comfortable with this plan.  Pt has Hb of 13 and Has been HD stable for 6 hours with normal BP and HR.  She will report to the [facility] ED and landa will see her.   Doretha Folks, MD 11/05/23 [id number]

## 2023-11-05 NOTE — ED Provider Notes (Signed)
 [place] EMERGENCY DEPARTMENT AT [facility] Provider Note  CSN: [id number] Arrival date & time: 11/05/23 0248  Chief Complaint(s) No chief complaint on file.  HPI Susan Fisher is a 58 y.o. female with a past medical history listed below here for abdominal pain.  Patient is postop day 1 following a hysteroscopy for polypectomy/endometrial biopsy that was complicated by a uterine perforation.  Patient reports that after getting home, she gradually had worsening abdominal pain and distention.  Worse with movement and palpation.  No nausea or vomiting.   reports that the vaginal bleeding that occurred following the procedure is slowing down.  The history is provided by the patient and medical records.    Past Medical History Past Medical History:  Diagnosis Date   Anxiety    Chronic insomnia    Depression    history of   Gluten intolerance 2011   Dr Richardine Hurst   Herniated lumbar intervertebral disc    Keloid    Pneumonia    childhood   Seasonal allergic rhinitis    Sleep disorder    Dr Albin, chronic and severe insomnia   Torn ACL (anterior cruciate ligament)    left   Patient Active Problem List   Diagnosis Date Noted   Post-COVID syndrome 05/04/2023   Sjogren syndrome (HCC) 03/01/2023   Bilateral dry eyes 01/25/2023   Post-menopausal osteoporosis 01/25/2023   H/O herpes labialis 06/22/2022   Insomnia due to stress 04/15/2021   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 Authorizing Provider  acetaminophen  (TYLENOL ) 650 MG CR tablet Take 1,300 mg by mouth every 8 (eight) hours as needed for pain.    [provider]  ALPRAZolam  (XANAX ) 0.25 MG tablet TAKE 1 TABLET(0.25 MG)  BY MOUTH TWICE DAILY AS NEEDED FOR ANXIETY 10/18/23   Joyce Norleen BROCKS, MD  amphetamine -dextroamphetamine  (ADDERALL) 10 MG tablet Take 1.5 tablets (15 mg total) by mouth daily as needed. 10/05/23   Dohmeier, Dedra, MD  B Complex Vitamins (B COMPLEX PO) Take by mouth.    [provider]  betamethasone  dipropionate 0.05 % cream Apply to ears nightly Patient not taking: Reported on 07/05/2023 05/14/21   Sheffield, Kelli R, PA-C  CALCIUM PO Take by mouth.    [provider]  L-ARGININE PO Take by mouth.    [provider]  Multiple Vitamins-Minerals (MULTIVITAMIN WITH MINERALS) tablet Take 1 tablet by mouth daily.    [provider]  Omega-3 Fatty Acids (FISH OIL PO) Take by mouth.    [provider]  Polyethyl Glycol-Propyl Glycol (SYSTANE OP) Place 1 drop into both eyes 2 (two) times daily.    [provider]  QUEtiapine  (SEROQUEL ) 25 MG tablet TAKE 1 TABLET(25 MG) BY MOUTH AT BEDTIME AS NEEDED 08/25/23   Sherryl Bouchard, NP  SELENIUM PO Take by mouth.    [provider]  valACYclovir  (VALTREX ) 1000 MG tablet Take 1 tablet (1,000 mg total) by mouth daily. 11/30/22   Joyce Norleen BROCKS, MD  VITAMIN D  PO Take by mouth.    [provider]  Allergies Gluten meal  Review of Systems Review of Systems As noted in HPI  Physical Exam Vital Signs  I have reviewed the triage vital signs BP (!) 143/85   Pulse 86   Temp 97.6 F (36.4 C) (Oral)   Resp 18   Ht 4' 10 (1.473 m)   Wt 56.2 kg   LMP  (LMP Unknown)   SpO2 100%   BMI 25.89 kg/m   Physical Exam Vitals reviewed.  Constitutional:      General: She is not in acute distress.    Appearance: She is well-developed. She is not diaphoretic.  HENT:     Head: Normocephalic and atraumatic.     Right Ear: External ear normal.     Left Ear: External ear  normal.     Nose: Nose normal.  Eyes:     General: No scleral icterus.    Conjunctiva/sclera: Conjunctivae normal.  Neck:     Trachea: Phonation normal.  Cardiovascular:     Rate and Rhythm: Normal rate and regular rhythm.  Pulmonary:     Effort: Pulmonary effort is normal. No respiratory distress.     Breath sounds: No stridor.  Abdominal:     General: There is distension.     Tenderness: There is generalized abdominal tenderness. There is guarding and rebound.  Musculoskeletal:        General: Normal range of motion.     Cervical back: Normal range of motion.  Neurological:     Mental Status: She is alert and oriented to person, place, and time.  Psychiatric:        Behavior: Behavior normal.     ED Results and Treatments Labs (all labs ordered are listed, but only abnormal results are displayed) Labs Reviewed  COMPREHENSIVE METABOLIC PANEL WITH GFR - Abnormal; Notable for the following components:      Result Value   Glucose, Bld 115 (*)    Calcium 8.8 (*)    Total Protein 6.2 (*)    All other components within normal limits  CBC - Abnormal; Notable for the following components:   WBC 13.1 (*)    All other components within normal limits  URINALYSIS, ROUTINE W REFLEX MICROSCOPIC - Abnormal; Notable for the following components:   Specific Gravity, Urine 1.034 (*)    Hgb urine dipstick SMALL (*)    Protein, ur 30 (*)    Leukocytes,Ua TRACE (*)    Bacteria, UA RARE (*)    All other components within normal limits  LIPASE, BLOOD                                                                                                                         EKG  EKG Interpretation Date/Time:    Ventricular Rate:    PR Interval:    QRS Duration:    QT Interval:    QTC Calculation:   R Axis:      Text Interpretation:  Radiology CT ABDOMEN PELVIS W CONTRAST Addendum Date: 11/05/2023 ADDENDUM REPORT: 11/05/2023 06:55 ADDENDUM: Critical Value/emergent results  were called by telephone at the time of interpretation on 11/05/2023 at 6:55 am to provider [facility] , who verbally acknowledged these results. Electronically Signed   By: Camellia Candle M.D.   On: 11/05/2023 06:55   Result Date: 11/05/2023 CLINICAL DATA:  1 day postop from diagnostic hysteroscopy with potential urine perforation. EXAM: CT ABDOMEN AND PELVIS WITH CONTRAST TECHNIQUE: Multidetector CT imaging of the abdomen and pelvis was performed using the standard protocol following bolus administration of intravenous contrast. RADIATION DOSE REDUCTION: This exam was performed according to the departmental dose-optimization program which includes automated exposure control, adjustment of the mA and/or kV according to patient size and/or use of iterative reconstruction technique. CONTRAST:  75mL OMNIPAQUE  IOHEXOL  350 MG/ML SOLN COMPARISON:  12/14/2012 FINDINGS: Lower chest: No acute findings. Hepatobiliary: No suspicious focal abnormality within the liver parenchyma. There is no evidence for gallstones, gallbladder wall thickening, or pericholecystic fluid. No intrahepatic or extrahepatic biliary dilation. Pancreas: No focal mass lesion. No dilatation of the main duct. No intraparenchymal cyst. No peripancreatic edema. Spleen: No splenomegaly. No suspicious focal mass lesion. Adrenals/Urinary Tract: No adrenal nodule or mass. Kidneys unremarkable. No evidence for hydroureter. The urinary bladder appears normal for the degree of distention. Stomach/Bowel: Stomach is unremarkable. No gastric wall thickening. No evidence of outlet obstruction. Duodenum is normally positioned as is the ligament of Treitz. No small bowel wall thickening. No small bowel dilatation. The terminal ileum is normal. The appendix is normal. Moderate right colonic distention with gas and stool. Vascular/Lymphatic: No abdominal aortic aneurysm. No abdominal aortic atherosclerotic calcification. There is no gastrohepatic or hepatoduodenal  ligament lymphadenopathy. No retroperitoneal or mesenteric lymphadenopathy. No pelvic sidewall lymphadenopathy. Reproductive: Endometrium appears hyperenhancing in the uterus. Uterus measures on the order of 5.1 x 2.5 x 3.0 cm. A large periuterine hematoma is identified measuring on the order of 6.5 x 4.9 x 7.1 cm. Complex free fluid is seen in the cul-de-sac. There is a linear band of contrast that appears to course through the cul-de-sac fluid on the left (see axial 62/4) with accumulation of contrast in the posterior left cul-de-sac (sagittal images 81-83 of series 8). Imaging features are highly suspicious for active contrast extravasation/bleeding. Neither ovary is discretely visualized. Other: There is a small amount of free fluid around the liver and spleen. Fluid is seen in the low paracolic gutters bilaterally, anterior and central pelvis, and cul-de-sac. Scattered areas of mesenteric fluid are evident. Musculoskeletal: No worrisome lytic or sclerotic osseous abnormality. Advanced degenerative disc disease noted at L2-3. IMPRESSION: 1. Large periuterine hematoma measuring on the order of 6.5 x 4.9 x 7.1 cm with hemoperitoneum. There is a linear band of contrast that appears to course through the cul-de-sac fluid on the left with accumulation of contrast in the dependent left cul-de-sac. Imaging features are highly suspicious for active contrast extravasation/bleeding. 2. Small amount of free fluid/blood products around the liver and spleen as well as low paracolic gutters bilaterally, anterior and central pelvis, and cul-de-sac. Electronically Signed: By: Camellia Candle M.D. On: 11/05/2023 06:51    Medications Ordered in ED Medications  HYDROmorphone  (DILAUDID ) injection 0.5 mg (0.5 mg Intravenous Given 11/05/23 0738)  0.9 %  sodium chloride  infusion (has no administration in time range)  oxyCODONE -acetaminophen  (PERCOCET/ROXICET) 5-325 MG per tablet 1 tablet (1 tablet Oral Given 11/05/23 0309)   iohexol  (OMNIPAQUE ) 350 MG/ML injection 75 mL (75 mLs Intravenous Contrast  Given 11/05/23 0626)   Procedures .Critical Care  Performed by: Trine Raynell Moder, MD Authorized by: Trine Raynell Moder, MD   Critical care provider statement:    Critical care time (minutes):  45   Critical care time was exclusive of:  Separately billable procedures and treating other patients   Critical care was necessary to treat or prevent imminent or life-threatening deterioration of the following conditions: uterine perforation with active bleeding.   Critical care was time spent personally by me on the following activities:  Development of treatment plan with patient or surrogate, discussions with consultants, evaluation of patient's response to treatment, examination of patient, obtaining history from patient or surrogate, review of old charts, re-evaluation of patient's condition, pulse oximetry, ordering and review of radiographic studies, ordering and review of laboratory studies and ordering and performing treatments and interventions   (including critical care time) Medical Decision Making / ED Course   Medical Decision Making Amount and/or Complexity of Data Reviewed Labs: ordered. Decision-making details documented in ED Course. Radiology: ordered and independent interpretation performed. Decision-making details documented in ED Course.  Risk Prescription drug management. Parenteral controlled substances.    Abdominal pain, postop day 1 Known uterine perforation  Differential diagnosis considered  Workup notable for hemoperitoneum with evidence of active bleeding.  Hemoglobin stable.  Patient is hemodynamically stable as well.  Provided with IV fluids and pain medicine.  She would like to stay here for any intervention. Will consult Ob/Gyn here. I consulted and spoke with the patient's local OB/GYN, Dr. Rutherford who deferred any treatment and management to primary [facility]  team.  Patient made aware.  She is agreeable to being treated at [facility].  Paging on-call provider for Dr. Mliss.  Patient care turned over to oncoming provider. Patient case and results discussed in detail; please see their note for further ED managment.       Final Clinical Impression(s) / ED Diagnoses Final diagnoses:  Uterine perforation, initial encounter    This chart was dictated using voice recognition software.  Despite best efforts to proofread,  errors can occur which can change the documentation meaning.    Trine Raynell Moder, MD 11/05/23 [phone]

## 2023-11-05 NOTE — Telephone Encounter (Signed)
 Reason for Disposition . Patient sounds very sick or weak to the triager  Protocols used: Post-Op Symptoms and Questions-A-AH  Pt had PR HYSTEROSCOPY W BX ENDOMETRIUM  HYSTEROSCOPY, SURGICAL; WITH SAMPLING (BIOPSY) OF ENDOMETRIUM AND/OR POLYPECTOMY, WITH D & C x yesterday, and pt stated that she was informed that she had perforated uterus while undergoing the procedure. Pt now reports severe constant abd pain, abdominal swelling, and mild vaginal bleeding. Pt is afebrile, denies dizziness or feeling faint, took Ibuprofen  800mg , and Tylenol  650mg  w/o any relief. Referred to ED for further evaluation.

## 2023-11-05 NOTE — ED Notes (Signed)
 Arranged with radiology for images to be pushed to [facility]

## 2023-11-22 ENCOUNTER — Other Ambulatory Visit: Payer: Self-pay | Admitting: Neurology

## 2023-11-22 NOTE — Telephone Encounter (Signed)
 Pt called to request refill on medication amphetamine -dextroamphetamine  (ADDERALL) 10 MG tablet /TEVA Version  Pt would like medication to be sent to    [id number] - [place], [place] - [address] (Ph: [phone])

## 2023-11-23 MED ORDER — AMPHETAMINE-DEXTROAMPHETAMINE 10 MG PO TABS
15.0000 mg | ORAL_TABLET | Freq: Every day | ORAL | 0 refills | Status: DC | PRN
Start: 2023-11-23 — End: 2023-12-27

## 2023-11-23 NOTE — Telephone Encounter (Signed)
 Requested Prescriptions   Pending Prescriptions Disp Refills   amphetamine -dextroamphetamine  (ADDERALL) 10 MG tablet 45 tablet 0    Sig: Take 1.5 tablets (15 mg total) by mouth daily as needed.   Last seen 09/15/23 Next appt 05/09/24 Dispenses   Dispensed Days Supply Quantity Provider Pharmacy  D-AMPHETAMINE  SALT COMBO 10MG  TAB 10/06/2023 30 45 each Dohmeier, Dedra, MD [facility] #...  D-AMPHETAMINE  SALT COMBO 10MG  TAB 09/07/2023 30 45 each Sherryl Bouchard, NP [facility] #...  D-AMPHETAMINE  SALT COMBO 10MG  TAB 07/28/2023 30 45 each Sherryl Bouchard, NP [facility] #...  D-AMPHETAMINE  SALT COMBO 10MG  TAB 06/22/2023 30 45 each Sherryl Bouchard, NP [facility] #...  D-AMPHETAMINE  SALT COMBO 10MG  TAB 05/17/2023 30 45 each Sherryl Bouchard, NP [facility] #...  D-AMPHETAMINE  SALT COMBO 10MG  TAB 04/12/2023 30 45 each Sherryl Bouchard, NP [facility] #...  D-AMPHETAMINE  SALT COMBO 10MG  TAB 03/07/2023 30 45 each Sherryl Bouchard, NP [facility]  D-AMPHETAMINE  SALT COMBO 10MG  TAB 01/27/2023 30 45 each Sherryl Bouchard, NP [facility]  D-AMPHETAMINE  SALT COMBO 10MG  TAB 12/16/2022 30 45 each Sherryl Bouchard, NP [email or web address]

## 2023-12-27 ENCOUNTER — Other Ambulatory Visit: Payer: Self-pay | Admitting: Adult Health

## 2023-12-27 MED ORDER — AMPHETAMINE-DEXTROAMPHETAMINE 10 MG PO TABS: 15.0000 mg | ORAL_TABLET | Freq: Every day | ORAL | 0 refills | Status: DC | PRN

## 2023-12-27 NOTE — Telephone Encounter (Signed)
 Pt called to request medication refill  amphetamine -dextroamphetamine  (ADDERALL) 10 MG tablet   Pt medication to should be  sent to    [id number] - [place], [place] - [address] (Ph: [phone])

## 2023-12-27 NOTE — Telephone Encounter (Signed)
 Last visit: 09/15/23  Next visit: 05/09/24  Per  registry, last fill:

## 2024-01-13 ENCOUNTER — Telehealth: Payer: Self-pay | Admitting: Pharmacist

## 2024-01-13 NOTE — Telephone Encounter (Signed)
 Patient due for Reclast  on 02/01/2024. Due for updated MP and Vitamin D  at OV with Dr. Jeannetta on 02/01/2024. Will place referral to infusion center after labs result  Sherry Pennant, PharmD, MPH, BCPS, CPP Clinical Pharmacist [facility])

## 2024-01-18 NOTE — Progress Notes (Signed)
 Office Visit Note  Patient: Susan Fisher             Date of Birth: April 20, 1966           MRN: [id number]             PCP: Joyce Norleen BROCKS, MD Referring: Joyce Norleen BROCKS, MD Visit Date: 02/01/2024   Subjective:  Medical Management of Chronic Issues (Reclast  IV )   Discussed the use of AI scribe software for clinical note transcription with the patient, who gave verbal consent to proceed.  History of Present Illness   Susan Fisher is a 58 y.o. female here for follow up  for suspected sjogren syndrome with positive ANA, chronic dry eye and mouth, and arthralgias.  She is here today regarding reclast  infusion management for her osteoporosis.  She underwent a uterine biopsy in July, which resulted in a perforation and subsequent hemorrhage, leading to a three-night hospital stay. This complication caused anemia and a hematoma in her abdominal cavity, which she refers to as her 'blood baby'. Initially, the hematoma caused severe pain, described as 'fifteen out of ten', but the pain has since decreased. She inquires about checking her hemoglobin levels due to previous anemia. The hematoma initially caused significant swelling and pain. The swelling has decreased, but she experiences leg swelling if standing for prolonged periods.  She has osteoporosis and was advised to limit physical activity post-surgery. She broke her toe a week after returning home, resulting in an avulsion fracture. She delayed seeking an x-ray for five weeks. The fracture is expected to take twelve weeks to heal. She engages in recumbent biking and weightlifting but cannot perform strenuous activities. She also uses a wind trainer at home with a boot on her foot.  She experiences dryness in her left eye, which has been treated with laser procedures every four months. The last treatment, about a month ago, was less effective, resulting in more abrasions. She uses eye drops and performs lid therapy daily. No mouth dryness, but  persistent dryness in her left eye.  She has allergies to mold and dust and receives allergy shots, typically once a week, but has been receiving them every other week since her surgery due to feeling unwell. She sometimes feels unwell, possibly due to allergies.       Previous HPI 05/04/2023 NIYANNA Fisher is a 58 y.o. female here for follow up for suspected sjogren syndrome with positive ANA, chronic dry eye and mouth, and arthralgias. She also presents with ongoing symptoms following a recent COVID-19 infection. They report having taken hydroxychloroquine  for five days prior to contracting the virus and have since discontinued the medication due to concerns about its potential impact on their immune system. Since their COVID-19 infection, the patient has experienced fluctuating symptoms, feeling relatively well one day and akin to having the flu the next. These flu-like symptoms include a sensation of fever, burning eyes, nasal congestion, and general malaise. The patient denies experiencing dizziness or lightheadedness.   They report no significant changes in their mouth symptoms, which have been a longstanding issue. The patient also mentions having a herniated disc, which has been causing them to move slowly and awkwardly. They deny any significant issues with cold weather, other than a general dislike for it.   The patient's symptoms have not improved significantly since their COVID-19 infection, and they continue to experience fluctuating symptoms.      Previous HPI 03/01/2023 Susan Fisher is a 58  y.o. female here for follow up with persistent chronic dryness and joint pains concerning for Sjogren syndrome.  Lab test showed very slightly low complement C4 and borderline ANA.  Otherwise negative for specific antibody markers.  Joint pain has been pretty much stable but had a lot of bodyaches lasting for about 5 days after starting Reclast  infusion also after getting a COVID-vaccine had bodyaches  for a shorter period.   01/25/23 Susan Fisher is a 58 y.o. female here for evaluation of chronic joint pain and dry eyes and mouth.  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  She sees [facility] for management including recent SI joint injection in August.  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  Had previous right knee ACL repair in 2015.  She is not on any prescription pain medication or anti-inflammatory drugs.  Previously was prescribed oral diclofenac  with some benefit but not continued long term. She gets partial benefit with OTC ibuprofen  and tylenol . Eye problems are chronic she has had issues with strabismus and difficulty focusing when fatigued going back to childhood.  Dry eyes have been a problem for years seeing [facility] for management.  Treatment has included artificial tears, etc., warm compresses, and procedures including lipiflow and optilight. Also has chronic dry mouth.  She has a geographic tongue appearance.  Does not get painful ulcers or sores.  Does not notice swelling along the jaw or neck. Reports sun sensitivity easily.  Usually resolves quickly unless she gets a sunburn.  No persistent rashes. She takes Boniva  and vitamin D  supplementation for osteopenia in the lumbar spine.   Labs reviewed 05/2022 Vit D 94.9   10/2016 ANA neg SSA, SSB neg RF neg     Review of Systems  Constitutional:  Positive for fatigue.  HENT:  Negative for mouth sores and mouth dryness.   Eyes:  Positive for dryness.  Respiratory:  Negative for shortness of breath.   Cardiovascular:  Negative for chest pain and palpitations.  Gastrointestinal:  Negative for blood in stool, constipation and diarrhea.  Endocrine: Negative for increased urination.  Genitourinary:  Negative for involuntary urination.  Musculoskeletal:  Positive for joint pain, joint pain,  muscle weakness and morning stiffness. Negative for gait problem, joint swelling, myalgias, muscle tenderness and myalgias.  Skin:  Negative for color change, rash, hair loss and sensitivity to sunlight.  Allergic/Immunologic: Positive for susceptible to infections.  Neurological:  Negative for dizziness and headaches.  Hematological:  Negative for swollen glands.  Psychiatric/Behavioral:  Positive for sleep disturbance. Negative for depressed mood. The patient is nervous/anxious.     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   Benign essential tremor 04/15/2021   Arthritis 11/30/2016   Gluten intolerance 11/07/2014   Seasonal allergies 04/04/2009   Anxiety state 10/09/2007   Disturbance in sleep behavior 10/09/2007    Past Medical History:  Diagnosis Date   Allergy Life long   Anxiety    Arthritis    Chronic insomnia    Depression    history of   GERD (gastroesophageal reflux disease)    Glaucoma    Gluten intolerance 2011  Dr Richardine Hurst   Herniated lumbar intervertebral disc    Keloid    Pneumonia    childhood   Seasonal allergic rhinitis    Sleep disorder    Dr Albin, chronic and severe insomnia   Torn ACL (anterior cruciate ligament)    left    Family History  Problem Relation Age of Onset   Anxiety disorder Mother    Depression Mother    Hypertension Mother    Stroke Mother 41   Heart failure Mother    Heart attack Mother 93   Breast cancer Mother 24   COPD Mother        smoker   Pulmonary embolism Father 15       post rotator cuff surgery   Tremor Sister    Tremor Sister    Heart attack Paternal Grandfather 23   Insomnia Neg Hx    Past Surgical History:  Procedure Laterality Date    ANTERIOR CRUCIATE LIGAMENT REPAIR Right 08/24/2013   [facility]   DILATATION & CURETTAGE/HYSTEROSCOPY WITH MYOSURE N/A 04/22/2016   Procedure: DILATATION & CURETTAGE/HYSTEROSCOPY WITH  MYOSURE;  Surgeon: Dickie Carder, MD;  Location: [facility];  Service: Gynecology;  Laterality: N/A;   DILATATION & CURETTAGE/HYSTEROSCOPY WITH MYOSURE N/A 08/31/2016   Procedure: DILATATION & CURETTAGE/HYSTEROSCOPY WITH MYOSURE;  Surgeon: Carder Dickie, MD;  Location: [facility];  Service: Gynecology;  Laterality: N/A;   DILATION AND CURETTAGE OF UTERUS  11/04/2023   EYE SURGERY     Lazer   KNEE ARTHROSCOPY Left 04/26/1993   UPPER GASTROINTESTINAL ENDOSCOPY  04/26/2006   neg; Dr Rosalie   UPPER GASTROINTESTINAL ENDOSCOPY  04/27/2007    [facility]   Social History   Social History Narrative   Patient is single and lives alone.     Patient has a college education.   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 -    planning to enroll with PT asst school at [employer] fall 2016   Patient drinks two caffeine drinks daily.   Patient is right-handed.      Immunization History  Administered Date(s) Administered   Hepatitis A, Adult 09/10/2014, 03/06/2015   Hepatitis A, Ped/Adol-2 Dose 09/10/2014, 03/06/2015   Hepatitis B, ADULT 11/07/2014, 12/03/2014, 03/06/2015   Hepatitis B, PED/ADOLESCENT 11/07/2014, 12/03/2014, 03/06/2015   Influenza, Seasonal, Injecte, Preservative Fre 02/28/2016   Influenza-Unspecified 01/28/2014, 02/28/2016, 02/11/2021   Moderna Covid-19 Vaccine Bivalent Booster 57yrs & up 01/22/2021   Moderna Sars-Covid-2 Vaccination 04/22/2019, 05/22/2019, 02/21/2020, 08/17/2020   PPD Test 11/12/2014, 12/03/2014, 01/10/2017   Pfizer(Comirnaty)Fall Seasonal Vaccine 12 years and older 01/25/2023   Td 03/29/2008   Tdap 11/08/2018     Objective: Vital Signs: BP 130/79   Pulse 71   Temp 98.1 F (36.7 C)   Resp 16   Ht 4' 10 (1.473 m)   Wt 128 lb 6.4 oz (58.2 kg)   LMP  (LMP Unknown)   BMI  26.84 kg/m    Physical Exam HENT:     Mouth/Throat:     Comments: Geographic/discolored tongue surface Eyes:     Conjunctiva/sclera: Conjunctivae normal.  Cardiovascular:     Rate and Rhythm: Normal rate and regular rhythm.  Pulmonary:     Effort: Pulmonary effort is normal.     Breath sounds: Normal breath sounds.  Musculoskeletal:     Right lower leg: No edema.     Left lower leg: No edema.  Lymphadenopathy:     Cervical: No cervical adenopathy.  Skin:    General: Skin  is warm and dry.  Neurological:     Mental Status: She is alert.  Psychiatric:        Mood and Affect: Mood normal.      Musculoskeletal Exam:  Neck full ROM no tenderness Shoulders full ROM no tenderness or swelling Elbows full ROM no tenderness or swelling Wrists full ROM no tenderness or swelling Fingers full ROM no tenderness or swelling Knees full ROM no tenderness or swelling   Investigation: No additional findings.  Imaging: No results found.  Recent Labs: Lab Results  Component Value Date   WBC 5.5 02/01/2024   HGB 15.0 02/01/2024   PLT 238 02/01/2024   NA 138 02/01/2024   K 4.7 02/01/2024   CL 101 02/01/2024   CO2 29 02/01/2024   GLUCOSE 91 02/01/2024   BUN 23 02/01/2024   CREATININE 0.71 02/01/2024   BILITOT 0.4 02/01/2024   ALKPHOS 40 11/05/2023   AST 20 02/01/2024   ALT 17 02/01/2024   PROT 7.0 02/01/2024   ALBUMIN 3.7 11/05/2023   CALCIUM 10.0 02/01/2024   GFRAA 111 11/08/2018    Speciality Comments: No specialty comments available.  Procedures:  No procedures performed Allergies: Gluten meal   Assessment / Plan:     Visit Diagnoses: Post-menopausal osteoporosis - Plan: Comprehensive metabolic panel with GFR, VITAMIN D  25 Hydroxy (Vit-D Deficiency, Fractures) Ongoing management with limitations in physical activity due to recent surgery and a toe fracture. Recumbent biking is not optimal for osteoporosis as it does not load the skeleton effectively. - Encourage  weightlifting, especially closed chain exercises, as tolerated, recumbent biking as an interim exercise. - Rechecking vitamin D  and CMP for Reclast  medication monitoring - Plan for Reclast  infusion pending lab results.  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 Recent laser treatment was less effective, resulting in more abrasions. - Continue current eye drop regimen. - Continue lid therapy.  Avulsion fracture of toe, healing Avulsion fracture of the toe. Healing is expected to take 12 weeks, with full recovery anticipated by early November. - Advise on limited weight-bearing activities until healing is complete. - Encourage use of recumbent bike and other non-weight-bearing exercises.  Abdominal hematoma, post-surgical Post-surgical abdominal hematoma causing abdominal swelling and discomfort. Hematoma is expected to be reabsorbed over time. - Reassure that hematoma will eventually be reabsorbed.  Anemia, post-surgical Anemia following recent surgery with significant blood loss during hospitalization. - Order hemoglobin test to assess current anemia status.  Allergic rhinitis Symptoms exacerbated by mold and dust exposure. Currently receiving allergy shots every other week due to post-surgical recovery. - Continue allergy shots as scheduled. - Adjust frequency of shots as needed based on symptoms.        Orders: Orders Placed This Encounter  Procedures   Comprehensive metabolic panel with GFR   VITAMIN D  25 Hydroxy (Vit-D Deficiency, Fractures)   CBC with Differential/Platelet   No orders of the defined types were placed in this encounter.    Follow-Up Instructions: Return in about 1 year (around 01/31/2025) for OP/SS on reclast  f/u 80yr.   Lonni LELON Ester,  MD  Note - This record has been created using [employer].  Chart creation errors have been sought, but may not always  have been located. Such creation errors do not reflect on  the standard of medical care.

## 2024-01-24 ENCOUNTER — Other Ambulatory Visit: Payer: Self-pay | Admitting: Family Medicine

## 2024-01-26 ENCOUNTER — Other Ambulatory Visit: Payer: Self-pay | Admitting: Adult Health

## 2024-01-26 MED ORDER — AMPHETAMINE-DEXTROAMPHETAMINE 10 MG PO TABS: 15.0000 mg | ORAL_TABLET | Freq: Every day | ORAL | 0 refills | Status: DC | PRN

## 2024-01-26 NOTE — Telephone Encounter (Signed)
 Pt is requesting a refill for amphetamine -dextroamphetamine  (ADDERALL) 10 MG tablet (Teva Brand).  Pharmacy: [facility] [phone]

## 2024-01-27 NOTE — Telephone Encounter (Signed)
 Returned call to pt who stated that she received a different brand of adderall and it was from a different manufacturer from the one she is used to and doesn't work as well. Pt stated that if it would've been sent to the [facility] in [place] she would've gotten the correct manufacturer. I told the pt that I would send this to megan millikan np and let her know that the [facility] use different manufacturers and pt only likes the one at [place]. This is a controlled substance so provider has to review and pt voiced understanding.

## 2024-01-27 NOTE — Telephone Encounter (Signed)
 Pt called stating that her medication was sent to the wrong Location  and it was the wrong Medication  Pt states medication was to be sent to [facility] in [facility]  Not [place],  Also Pt notice as she got home that she had the wrong medication . PT states that she not sure if she should take this medication and would like to speak to nurse about Getting correct Medication   amphetamine -dextroamphetamine  (ADDERALL) 10 MG tablet  TEVA brans  [id number] - [place], [place] - [address] Phone: [phone]  Fax: [phone]

## 2024-01-29 NOTE — Telephone Encounter (Signed)
 Prescsription said TEVA brand  Can you reach out to pharmacy and see why they didn't give her that. ALso let patient know that I was on the script. I sent it to the pharmacy that was sent to me for the med request.

## 2024-01-30 NOTE — Telephone Encounter (Signed)
 Attempted to call pharmacy, closed at this time.

## 2024-02-01 ENCOUNTER — Encounter: Payer: Self-pay | Admitting: Internal Medicine

## 2024-02-01 ENCOUNTER — Ambulatory Visit: Attending: Internal Medicine | Admitting: Internal Medicine

## 2024-02-01 VITALS — BP 130/79 | HR 71 | Temp 98.1°F | Resp 16 | Ht <= 58 in | Wt 128.4 lb

## 2024-02-01 DIAGNOSIS — M810 Age-related osteoporosis without current pathological fracture: Secondary | ICD-10-CM

## 2024-02-01 DIAGNOSIS — D500 Iron deficiency anemia secondary to blood loss (chronic): Secondary | ICD-10-CM

## 2024-02-01 DIAGNOSIS — H04123 Dry eye syndrome of bilateral lacrimal glands: Secondary | ICD-10-CM

## 2024-02-01 DIAGNOSIS — M3501 Sicca syndrome with keratoconjunctivitis: Secondary | ICD-10-CM

## 2024-02-01 DIAGNOSIS — E559 Vitamin D deficiency, unspecified: Secondary | ICD-10-CM | POA: Diagnosis not present

## 2024-02-01 LAB — COMPREHENSIVE METABOLIC PANEL WITH GFR
AG Ratio: 2.2 (calc) (ref 1.0–2.5)
ALT: 17 U/L (ref 6–29)
AST: 20 U/L (ref 10–35)
Albumin: 4.8 g/dL (ref 3.6–5.1)
Alkaline phosphatase (APISO): 45 U/L (ref 37–153)
BUN: 23 mg/dL (ref 7–25)
CO2: 29 mmol/L (ref 20–32)
Calcium: 10 mg/dL (ref 8.6–10.4)
Chloride: 101 mmol/L (ref 98–110)
Creat: 0.71 mg/dL (ref 0.50–1.03)
Globulin: 2.2 g/dL (ref 1.9–3.7)
Glucose, Bld: 91 mg/dL (ref 65–99)
Potassium: 4.7 mmol/L (ref 3.5–5.3)
Sodium: 138 mmol/L (ref 135–146)
Total Bilirubin: 0.4 mg/dL (ref 0.2–1.2)
Total Protein: 7 g/dL (ref 6.1–8.1)
eGFR: 99 mL/min/1.73m2 (ref >=60)

## 2024-02-01 LAB — VITAMIN D 25 HYDROXY (VIT D DEFICIENCY, FRACTURES): Vit D, 25-Hydroxy: 53 ng/mL (ref 30–100)

## 2024-02-01 LAB — CBC WITH DIFFERENTIAL/PLATELET
Absolute Lymphocytes: 1865 {cells}/uL (ref 850–3900)
Absolute Monocytes: 391 {cells}/uL (ref 200–950)
Basophils Absolute: 50 {cells}/uL (ref 0–200)
Basophils Relative: 0.9 %
Eosinophils Absolute: 110 {cells}/uL (ref 15–500)
Eosinophils Relative: 2 %
HCT: 46.5 % — ABNORMAL HIGH (ref 35.0–45.0)
Hemoglobin: 15 g/dL (ref 11.7–15.5)
MCH: 30.1 pg (ref 27.0–33.0)
MCHC: 32.3 g/dL (ref 32.0–36.0)
MCV: 93.2 fL (ref 80.0–100.0)
MPV: 10.8 fL (ref 7.5–12.5)
Monocytes Relative: 7.1 %
Neutro Abs: 3086 {cells}/uL (ref 1500–7800)
Neutrophils Relative %: 56.1 %
Platelets: 238 Thousand/uL (ref 140–400)
RBC: 4.99 Million/uL (ref 3.80–5.10)
RDW: 12.7 % (ref 11.0–15.0)
Total Lymphocyte: 33.9 %
WBC: 5.5 Thousand/uL (ref 3.8–10.8)

## 2024-02-02 ENCOUNTER — Telehealth: Payer: Self-pay

## 2024-02-02 ENCOUNTER — Other Ambulatory Visit: Payer: Self-pay | Admitting: Pharmacist

## 2024-02-02 ENCOUNTER — Ambulatory Visit: Payer: Self-pay | Admitting: Pharmacist

## 2024-02-02 NOTE — Progress Notes (Signed)
 Referral placed for Reclast  IV [phone]) at [facility] [phone]) [place] to start benefits investigation.  Diagnosis: age-related osteoporosis  Provider: Dr. Lonni Ester  Dose: 5mg  IV every 12 months Premedications: acetaminophen  650mg  p.o. and diphenhydramine  25mg  p.o.  Last dose/infusion date: 02/01/2023 Last Clinic Visit: 02/01/2024 Next Clinic Visit: 01/30/2025  Pertinent baseline labs: CMP and Vitamin D  on 02/01/24 wnl  Once benefits are processed, infusion center will contact patient to schedule.   Sherry Pennant, PharmD, MPH, BCPS, CPP Clinical Pharmacist [facility])

## 2024-02-02 NOTE — Telephone Encounter (Signed)
 Dr. Jeannetta, patient will be scheduled as soon as possible.  Auth Submission: NO AUTH NEEDED Site of care: Site of care: [facility] Payer: [employer] state health plan Medication & CPT/J Code(s) submitted: Reclast  (Zolendronic acid) [id number] Diagnosis Code:  Route of submission (phone, fax, portal): portal Phone # Fax # Auth type: Buy/Bill PB Units/visits requested: 5mg  x 1 dose Reference number:  Approval from: 02/02/24 to 04/25/24

## 2024-03-05 ENCOUNTER — Ambulatory Visit: Admitting: Family Medicine

## 2024-03-05 ENCOUNTER — Ambulatory Visit: Payer: Self-pay

## 2024-03-05 ENCOUNTER — Encounter: Payer: Self-pay | Admitting: Family Medicine

## 2024-03-05 VITALS — BP 134/84 | HR 92 | Temp 99.0°F | Ht <= 58 in | Wt 130.0 lb

## 2024-03-05 DIAGNOSIS — J302 Other seasonal allergic rhinitis: Secondary | ICD-10-CM

## 2024-03-05 DIAGNOSIS — R509 Fever, unspecified: Secondary | ICD-10-CM

## 2024-03-05 DIAGNOSIS — R058 Other specified cough: Secondary | ICD-10-CM

## 2024-03-05 DIAGNOSIS — J069 Acute upper respiratory infection, unspecified: Secondary | ICD-10-CM

## 2024-03-05 LAB — POCT INFLUENZA A/B
Influenza A, POC: NEGATIVE
Influenza B, POC: NEGATIVE

## 2024-03-05 LAB — POC COVID19 BINAXNOW: SARS Coronavirus 2 Ag: NEGATIVE

## 2024-03-05 NOTE — Telephone Encounter (Signed)
 FYI Only or Action Required?: FYI only for provider: appointment scheduled on 11/10.  Patient was last seen in primary care on 07/05/2023 by Joyce Norleen BROCKS, MD.  Called Nurse Triage reporting Cough and Shortness of Breath.  Symptoms began several days ago.  Interventions attempted: OTC medications: Tylenol , Advil  Cold and Sinus.  Symptoms are: gradually worsening.  Triage Disposition: See HCP Within 4 Hours (Or PCP Triage)  Patient/caregiver understands and will follow disposition?: Yes           Copied from CRM #[id number]. Topic: Clinical - Red Word Triage >> Mar 05, 2024 12:32 PM Joesph NOVAK wrote: Red Word that prompted transfer to Nurse Triage:  Patient has been feeling sick a couple days, Sinus headache, congestion, feels like its going into her lungs. Coughing up phelgm Reason for Disposition  [1] MILD difficulty breathing (e.g., minimal/no SOB at rest, SOB with walking, pulse < 100) AND [2] still present when not coughing  Answer Assessment - Initial Assessment Questions 1. ONSET: When did the cough begin?      Last Wednesday   2. SEVERITY: How bad is the cough today?      Moderate   3. SPUTUM: Describe the color of your sputum (e.g., none, dry cough; clear, white, yellow, green)     Clear   4. HEMOPTYSIS: Are you coughing up any blood? If Yes, ask: How much? (e.g., flecks, streaks, tablespoons, etc.)     No   5. DIFFICULTY BREATHING: Are you having difficulty breathing? If Yes, ask: How bad is it? (e.g., mild, moderate, severe)       SOB, when breathing in from chest congestion    6. FEVER: Do you have a fever? If Yes, ask: What is your temperature, how was it measured, and when did it start?     No   7. CARDIAC HISTORY: Do you have any history of heart disease? (e.g., heart attack, congestive heart failure)      No   8. LUNG HISTORY: Do you have any history of lung disease?  (e.g., pulmonary embolus, asthma, emphysema)  No   10.  OTHER SYMPTOMS: Do you have any other symptoms? (e.g., runny nose, wheezing, chest pain)  Nasal congestion   Sinus headache, congestion, feels like its going into her lungs. Coughing up clear phlegm. Covid test was negative.  Appointment scheduled for evaluation. Patient agrees with plan of care, and will call back if anything changes, or if symptoms worsen.  Protocols used: Cough - Acute Productive-A-AH

## 2024-03-05 NOTE — Patient Instructions (Signed)
 Stay well hydrated. Mucinex (guaifenesin) is an expectorant that will help loosen up mucus (in the sinuses and chest). You can either get the Mucinex DM combination, or get a separate Delsym syrup (the same dextromethorphan ingredient as the DM) to use when needed for coughing (it is a cough suppressant).  Consider doing sinus rinses with either a Neti-pot or sinus rinse kit.  Be sure to use distilled or boiled water (not tap water). You can use this once or twice daily, as needed.  Contact us  in a few days if congestion and shortness of breath haven't started to improve--we discussed using a course of steroids to treat the allergy component and any wheezing (none present today).  If you have persistent fevers, worsening cough, shortness of breath or discolored mucus or phlegm please follow up.

## 2024-03-05 NOTE — Progress Notes (Signed)
 Chief Complaint  Patient presents with   Cough    Started feeling last Wed, took covid test Fri that was neg. Having some SOB, feels like the beginning of bronchits. Mucus is clear. Did not take supplement today except for MVI.   She started feeling bad on Wed 11/5. She started with sinus headache, congestion, and it is now moving down into her chest.  She is coughing more. Phlegm is clear. Nasal drainage is clear.  Sinus headaches are at the forehead. When she takes a deep breath, she feels tightness in the chest. Overall feels fatigued.  She thinks she would be feel out of breath if she walked up a flight of stairs right now, but hasn't actually felt out of breath yet.  Today she hasn't been able to expectorate any phlegm. She took a home COVID test 11/7 and it was negative.  She takes 2 tylenol  for her back, and advil  cold and sinus, so she isn't sure if she has had fevers. Last week she took a cold remedy from work (tylenol  and decongestant).  Allergies --she started back on immunotherapy last January. Goes weekly, on Wednesdays. She uses flonase, zyrtec regularly.   Planning to go to [place] Friday morning for vacation.   PMH, PSH, SH reviewed  Seasonal allergies ADD Insomnia  Outpatient Encounter Medications as of 03/05/2024  Medication Sig Note   acetaminophen  (TYLENOL ) 650 MG CR tablet Take 1,300 mg by mouth every 8 (eight) hours as needed for pain. 03/05/2024: Took 2 this am   ALPRAZolam  (XANAX ) 0.25 MG tablet TAKE 1 TABLET(0.25 MG) BY MOUTH TWICE DAILY AS NEEDED FOR ANXIETY 03/05/2024: Took one last night   amphetamine -dextroamphetamine  (ADDERALL) 10 MG tablet Take 1.5 tablets (15 mg total) by mouth daily as needed.    azelastine (OPTIVAR) 0.05 % ophthalmic solution as needed.    B Complex Vitamins (B COMPLEX PO) Take by mouth.    betamethasone  dipropionate 0.05 % cream Apply to ears nightly 03/05/2024: As needed   CALCIUM PO Take by mouth. 07/05/2023: Once weekly    cetirizine (ZYRTEC) 10 MG tablet Take 10 mg by mouth daily.    fluticasone (FLONASE) 50 MCG/ACT nasal spray SMARTSIG:1-2 Spray(s) Both Nares Daily    Multiple Vitamins-Minerals (MULTIVITAMIN WITH MINERALS) tablet Take 1 tablet by mouth daily.    Polyethyl Glycol-Propyl Glycol (SYSTANE OP) Place 1 drop into both eyes 2 (two) times daily.    Pseudoephedrine-Ibuprofen  30-200 MG TABS Take 1 tablet by mouth as needed. 03/05/2024: Took one at 7am.    QUEtiapine  (SEROQUEL ) 25 MG tablet TAKE 1 TABLET(25 MG) BY MOUTH AT BEDTIME AS NEEDED    valACYclovir  (VALTREX ) 1000 MG tablet Take 1 tablet (1,000 mg total) by mouth daily.    Zoledronic  Acid (RECLAST  IV) Inject 5 mg into the vein.    EPINEPHrine 0.3 mg/0.3 mL IJ SOAJ injection  (Patient not taking: Reported on 03/05/2024)    L-ARGININE PO Take by mouth. (Patient not taking: Reported on 03/05/2024)    Omega-3 Fatty Acids (FISH OIL PO) Take by mouth. (Patient not taking: Reported on 03/05/2024)    SELENIUM PO Take by mouth. (Patient not taking: Reported on 03/05/2024)    VITAMIN D  PO Take by mouth. (Patient not taking: Reported on 03/05/2024)    No facility-administered encounter medications on file as of 03/05/2024.   Allergies  Allergen Reactions   Gluten Meal Other (See Comments)    Gluten intolerant    ROS: no known fever, chills. No n/v/d No rashes No  known sick contacts No CP.  Chest congestion and slight SOB per HPI. URI symptoms per HPI.     PHYSICAL EXAM:  BP 134/84   Pulse 92   Temp 99 F (37.2 C) (Tympanic)   Ht 4' 10 (1.473 m)   Wt 130 lb (59 kg)   LMP  (LMP Unknown)   SpO2 98%   BMI 27.17 kg/m   Pleasant, well-appearing female, in no distress.  Rare dry cough. HEENT: conjunctiva and sclera are clear, EOMI.  Some cerumen on the right, clear on the left. TM's normal. Nasal mucosa--mild-mod edema, pale, no erythema or purulence. Sinuses nontender OP with some cobblestoning and clear-white mucus posteriorly Neck: no  lymphadenopathy or mass Heart: regular rate and rhythm Lungs: clear bilaterally, no wheezes, rales, ronchi  COVID and flu tests were negative   ASSESSMENT/PLAN:  Viral upper respiratory infection - supportive measures reviewed, s/sx bacterial infection reviewed. Add mucinex +/-DM, sinus rinses. Cont decongestant and allergy meds  Seasonal allergies - continue flonase, zyrtec, immunotherapy.If symptoms persistent/worsening without infection, consider steroid course  Other cough - Plan: POC COVID-19 BinaxNow, Influenza A/B  Fever, unspecified fever cause - Plan: POC COVID-19 BinaxNow, Influenza A/B  Discussed contacting us  for prednisone if allergies/congestion worse, w/o e/o infection, vs ABX if discolored mucus. To contact us  in a few days (prior to leaving for trip) if not better. Discussed using decongestant (vs afrin spray) prior to flying Supportive measures reviewed in detail   I spent 31 minutes dedicated to the care of this patient, including pre-visit review of records, face to face time, post-visit ordering of testing and documentation.  Stay well hydrated. Mucinex (guaifenesin) is an expectorant that will help loosen up mucus (in the sinuses and chest). You can either get the Mucinex DM combination, or get a separate Delsym syrup (the same dextromethorphan ingredient as the DM) to use when needed for coughing (it is a cough suppressant).  Consider doing sinus rinses with either a Neti-pot or sinus rinse kit.  Be sure to use distilled or boiled water (not tap water). You can use this once or twice daily, as needed.  Contact us  in a few days if congestion and shortness of breath haven't started to improve--we discussed using a course of steroids to treat the allergy component and any wheezing (none present today).  If you have persistent fevers, worsening cough, shortness of breath or discolored mucus or phlegm please follow up.

## 2024-03-14 ENCOUNTER — Other Ambulatory Visit: Payer: Self-pay | Admitting: Adult Health

## 2024-03-14 MED ORDER — AMPHETAMINE-DEXTROAMPHETAMINE 10 MG PO TABS: 15.0000 mg | ORAL_TABLET | Freq: Every day | ORAL | 0 refills | Status: DC | PRN

## 2024-03-14 NOTE — Telephone Encounter (Signed)
 Pt called to request medication refill  Pt stated that is should be  Teva Brand amphetamine -dextroamphetamine  (ADDERALL) 10 MG tablet  Pt medication is to be sent to  [facility] #[id number] - [place], [place] - [address] AT [facility] [address] (Ph: [phone])

## 2024-03-19 ENCOUNTER — Encounter: Payer: Self-pay | Admitting: Adult Health

## 2024-04-04 ENCOUNTER — Ambulatory Visit

## 2024-04-04 VITALS — BP 139/86 | HR 66 | Temp 98.0°F | Resp 16 | Ht 58.5 in | Wt 130.4 lb

## 2024-04-04 DIAGNOSIS — M810 Age-related osteoporosis without current pathological fracture: Secondary | ICD-10-CM | POA: Diagnosis not present

## 2024-04-04 MED ORDER — ACETAMINOPHEN 325 MG PO TABS
650.0000 mg | ORAL_TABLET | Freq: Once | ORAL | Status: AC
  Administered 2024-04-04: 650 mg via ORAL
  Filled 2024-04-04: qty 2

## 2024-04-04 MED ORDER — DIPHENHYDRAMINE HCL 25 MG PO CAPS
25.0000 mg | ORAL_CAPSULE | Freq: Once | ORAL | Status: AC
  Administered 2024-04-04: 25 mg via ORAL
  Filled 2024-04-04: qty 1

## 2024-04-04 MED ORDER — ZOLEDRONIC ACID 5 MG/100ML IV SOLN
5.0000 mg | Freq: Once | INTRAVENOUS | Status: AC
  Administered 2024-04-04: 5 mg via INTRAVENOUS
  Filled 2024-04-04: qty 100

## 2024-04-04 NOTE — Progress Notes (Signed)
 Diagnosis: , Osteoporosis  Provider:  Lonna Coder MD  Procedure: IV Infusion  IV Type: Peripheral, IV Location: R Antecubital  , Reclast  (Zolendronic Acid), Dose: 5 mg  Infusion Start Time: 1512  Infusion Stop Time: 1542  Post Infusion IV Care: Patient declined observation and Peripheral IV Discontinued  Discharge: Condition: Good, Destination: Home . AVS Declined  Performed by:  Donny Childes, RN

## 2024-04-10 ENCOUNTER — Other Ambulatory Visit: Payer: Self-pay | Admitting: Family Medicine

## 2024-04-10 DIAGNOSIS — B001 Herpesviral vesicular dermatitis: Secondary | ICD-10-CM

## 2024-05-08 NOTE — Progress Notes (Unsigned)
 "   PATIENT: Susan Fisher DOB: 1965-09-02  REASON FOR VISIT: follow up HISTORY FROM: patient  HISTORY OF PRESENT ILLNESS: Today 05/08/2024:  Susan Fisher is a 59 y.o. female with a history of ***. Returns today for follow-up.     Susan Fisher is a 59 y.o. female with a history of essential tremor, insomnia and daytime sleepiness. Returns today for follow-up.  She reports that Adderall 50 mg daily is working well as long as she gets TEVA brand.  Reports that tremor has remained stable.  She continues to take Seroquel  at bedtime but does note that she is still having some trouble with insomnia.  States that if she is unable to fall asleep within 10 to 15 minutes she starts having anxiety that can end in a panic attack.  Reports that she has seen therapist in the past.  But has never seen psychiatry.  Has never done any cognitive behavioral therapy.  Reports that the only new medical history is an abnormal Pap smear.  She has been followed by [facility].  She returns today for an evaluation.   02/15/23: Susan Fisher is a 59 y.o. female with a history of essential tremor, insomnia and daytime sleepiness. Returns today for follow-up. Overall she is doing well. Remains on Adderall 15 mg daily. She reports this works well if she gets the TEVA brand. Tremor has remained stable. Remains on seroquel  at bedtime and that works well. She returns today for follow-up.    07/08/22: Susan Fisher is a 59 y.o. female who has been followed in this office for essential tremor, insomnia and daytime sleepiness. Returns today for follow-up. Usually only takes 1 tablet of Seroquel  at bedtime and works fairly well for sleep.  She states that there are nights that she does have trouble going to sleep.  Overall though she feels that she is doing well continues to take Adderall extended release 15 mg during the day.  Tremor is relatively stable.  Has good days and bad days.  Returns today for an evaluation  HISTORY  10/14/21    Ms. Widmann is a 59 year old female with a history of essential tremor, insomnia and daytime sleepiness.  She returns today for follow-up.  She reports that she continues to have trouble sleeping and staying awake during the day.  She is taking Seroquel  1-1/2 tablets at bedtime and continues taking Adderall extended release 15 mg during the day.  She states that she usually gets anxiety when she is trying to go to sleep.  If she does not fall asleep within 15 minutes she begins to have a panic attack and that interrupts her from being able to go to sleep.  She was given a prescription for the [facility] device for tremor however she has not gotten this yet.  She returns today for an evaluation.   HISTORY   REVIEW OF SYSTEMS: Out of a complete 14 system review of symptoms, the patient complains only of the following symptoms, and all other reviewed systems are negative.  ALLERGIES: Allergies  Allergen Reactions   Gluten Meal Other (See Comments)    Gluten intolerant    HOME MEDICATIONS: Outpatient Medications Prior to Visit  Medication Sig Dispense Refill   acetaminophen  (TYLENOL ) 650 MG CR tablet Take 1,300 mg by mouth every 8 (eight) hours as needed for pain.     ALPRAZolam  (XANAX ) 0.25 MG tablet TAKE 1 TABLET(0.25 MG) BY MOUTH TWICE DAILY AS NEEDED FOR ANXIETY 30 tablet  1   amphetamine -dextroamphetamine  (ADDERALL) 10 MG tablet Take 1.5 tablets (15 mg total) by mouth daily as needed. 45 tablet 0   azelastine (OPTIVAR) 0.05 % ophthalmic solution as needed.     B Complex Vitamins (B COMPLEX PO) Take by mouth.     betamethasone  dipropionate 0.05 % cream Apply to ears nightly 45 g 2   CALCIUM PO Take by mouth.     cetirizine (ZYRTEC) 10 MG tablet Take 10 mg by mouth daily.     EPINEPHrine 0.3 mg/0.3 mL IJ SOAJ injection  (Patient not taking: Reported on 03/05/2024)     fluticasone (FLONASE) 50 MCG/ACT nasal spray SMARTSIG:1-2 Spray(s) Both Nares Daily     L-ARGININE PO Take by mouth. (Patient  not taking: Reported on 03/05/2024)     Multiple Vitamins-Minerals (MULTIVITAMIN WITH MINERALS) tablet Take 1 tablet by mouth daily.     Omega-3 Fatty Acids (FISH OIL PO) Take by mouth. (Patient not taking: Reported on 03/05/2024)     Polyethyl Glycol-Propyl Glycol (SYSTANE OP) Place 1 drop into both eyes 2 (two) times daily.     Pseudoephedrine-Ibuprofen  30-200 MG TABS Take 1 tablet by mouth as needed.     QUEtiapine  (SEROQUEL ) 25 MG tablet TAKE 1 TABLET(25 MG) BY MOUTH AT BEDTIME AS NEEDED 90 tablet 3   SELENIUM PO Take by mouth. (Patient not taking: Reported on 03/05/2024)     valACYclovir  (VALTREX ) 1000 MG tablet TAKE 1 TABLET(1000 MG) BY MOUTH DAILY 90 tablet 3   VITAMIN D  PO Take by mouth. (Patient not taking: Reported on 03/05/2024)     Zoledronic  Acid (RECLAST  IV) Inject 5 mg into the vein.     No facility-administered medications prior to visit.    PAST MEDICAL HISTORY: Past Medical History:  Diagnosis Date   Allergy Life long   Anxiety    Arthritis    Chronic insomnia    Depression    history of   GERD (gastroesophageal reflux disease)    Glaucoma    Gluten intolerance 2011   Dr Richardine Hurst   Herniated lumbar intervertebral disc    Keloid    Pneumonia    childhood   Seasonal allergic rhinitis    Sleep disorder    Dr Albin, chronic and severe insomnia   Torn ACL (anterior cruciate ligament)    left    PAST SURGICAL HISTORY: Past Surgical History:  Procedure Laterality Date   ANTERIOR CRUCIATE LIGAMENT REPAIR Right 08/24/2013   [facility]   DILATATION & CURETTAGE/HYSTEROSCOPY WITH MYOSURE N/A 04/22/2016   Procedure: DILATATION & CURETTAGE/HYSTEROSCOPY WITH  MYOSURE;  Surgeon: Dickie Carder, MD;  Location: [facility];  Service: Gynecology;  Laterality: N/A;   DILATATION & CURETTAGE/HYSTEROSCOPY WITH MYOSURE N/A 08/31/2016   Procedure: DILATATION & CURETTAGE/HYSTEROSCOPY WITH MYOSURE;  Surgeon: Carder Dickie, MD;  Location: [facility];  Service: Gynecology;   Laterality: N/A;   DILATION AND CURETTAGE OF UTERUS  11/04/2023   EYE SURGERY     [facility]   KNEE ARTHROSCOPY Left 04/26/1993   UPPER GASTROINTESTINAL ENDOSCOPY  04/26/2006   neg; Dr Rosalie   UPPER GASTROINTESTINAL ENDOSCOPY  04/27/2007    [facility]    FAMILY HISTORY: Family History  Problem Relation Age of Onset   Anxiety disorder Mother    Depression Mother    Hypertension Mother    Stroke Mother 22   Heart failure Mother    Heart attack Mother 45   Breast cancer Mother 64   COPD Mother  smoker   Pulmonary embolism Father 48       post rotator cuff surgery   Tremor Sister    Tremor Sister    Heart attack Paternal Grandfather 10   Insomnia Neg Hx     SOCIAL HISTORY: Social History   Socioeconomic History   Marital status: Single    Spouse name: Not on file   Number of children: 0   Years of education: College   Highest education level: Not on file  Occupational History    Employer: [employer]  Tobacco Use   Smoking status: Never    Passive exposure: Past   Smokeless tobacco: Never  Vaping Use   Vaping status: Never Used  Substance and Sexual Activity   Alcohol use: Yes    Comment: occ   Drug use: No   Sexual activity: Not Currently  Other Topics Concern   Not on file  Social History Narrative   Patient is single and lives alone.     Patient has a college education.   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 -    planning to enroll with PT asst school at [employer] fall 2016   Patient drinks two caffeine drinks daily.   Patient is right-handed.      Social Drivers of Health   Tobacco Use: Low Risk (03/05/2024)   Patient History    Smoking Tobacco Use: Never    Smokeless Tobacco Use: Never    Passive Exposure: Past  Financial Resource Strain: Low Risk  (11/06/2023)   [facility]   Overall Financial Resource Strain (CARDIA)    Difficulty of Paying Living Expenses: Not hard at all  Food Insecurity: No Food  Insecurity (11/06/2023)   [facility]   Epic    Within the past 12 months, you worried that your food would run out before you got the money to buy more.: Never true    Within the past 12 months, the food you bought just didn't last and you didn't have money to get more.: Never true  Transportation Needs: No Transportation Needs (11/06/2023)   Received from [facility] - Transportation    In the past 12 months, has lack of transportation kept you from medical appointments or from getting medications?: No    Lack of Transportation (Non-Medical): No  Physical Activity: Not on file  Stress: Not on file  Social Connections: Not on file  Intimate Partner Violence: Not on file  Depression (PHQ2-9): Low Risk (07/05/2023)   Depression (PHQ2-9)    PHQ-2 Score: 0  Alcohol Screen: Not on file  Housing: Low Risk  (11/06/2023)   Received from [facility]   Epic    In the last 12 months, was there a time when you were not able to pay the mortgage or rent on time?: No    In the past 12 months, how many times have you moved where you were living?: 0    At any time in the past 12 months, were you homeless or living in a shelter (including now)?: No  Utilities: Not At Risk (11/05/2023)   Received from [facility]   Epic    In the past 12 months has the electric, gas, oil, or water company threatened to shut off services in your home?: No  Health Literacy: Not on file      PHYSICAL EXAM Generalized: Well developed, in no  acute distress   Neurological examination  Mentation: Alert oriented to time, place, history taking. Follows all commands speech and language fluent Cranial nerve II-XII:Extraocular movements were full. Facial symmetry noted. uvula tongue midline. Head turning and shoulder shrug  were normal and symmetric. Motor: Good strength throughout subjectively per patient Sensory: Sensory testing is  intact to soft touch on all 4 extremities subjectively per patient Coordination: Cerebellar testing reveals good finger-nose-finger  Gait and station: Patient is able to stand from a seated position. gait is normal.  Reflexes: UTA  DIAGNOSTIC DATA (LABS, IMAGING, TESTING) - I reviewed patient records, labs, notes, testing and imaging myself where available.  Lab Results  Component Value Date   WBC 5.5 02/01/2024   HGB 15.0 02/01/2024   HCT 46.5 (H) 02/01/2024   MCV 93.2 02/01/2024   PLT 238 02/01/2024      Component Value Date/Time   NA 138 02/01/2024 1445   NA 141 12/23/2022 1536   K 4.7 02/01/2024 1445   CL 101 02/01/2024 1445   CO2 29 02/01/2024 1445   GLUCOSE 91 02/01/2024 1445   BUN 23 02/01/2024 1445   BUN 14 12/23/2022 1536   CREATININE 0.71 02/01/2024 1445   CALCIUM 10.0 02/01/2024 1445   PROT 7.0 02/01/2024 1445   PROT 6.6 12/23/2022 1536   ALBUMIN 3.7 11/05/2023 0310   ALBUMIN 4.5 12/23/2022 1536   AST 20 02/01/2024 1445   ALT 17 02/01/2024 1445   ALKPHOS 40 11/05/2023 0310   BILITOT 0.4 02/01/2024 1445   BILITOT 0.3 12/23/2022 1536   GFRNONAA >60 11/05/2023 0310   GFRAA 111 11/08/2018 1445   Lab Results  Component Value Date   CHOL 192 07/05/2023   HDL 86 07/05/2023   LDLCALC 93 07/05/2023   TRIG 72 07/05/2023   CHOLHDL 2.2 07/05/2023   No results found for: HGBA1C No results found for: VITAMINB12 Lab Results  Component Value Date   TSH 1.540 12/23/2022      ASSESSMENT AND PLAN 59 y.o. year old female  has a past medical history of Allergy (Life long), Anxiety, Arthritis, Chronic insomnia, Depression, GERD (gastroesophageal reflux disease), Glaucoma, Gluten intolerance (2011), Herniated lumbar intervertebral disc, Keloid, Pneumonia, Seasonal allergic rhinitis, Sleep disorder, and Torn ACL (anterior cruciate ligament). here with:   1.  Insomnia 2.  Daytime sleepiness 3.  Essential tremor   Continue Seroquel  25 mg 1 tablet at  bedtime Continue Adderall 15 mg daily Patient is interested in referral to psychiatry for potential cognitive behavioral therapy for insomnia Tremor stable Follow-up in 6 months or sooner if needed     Duwaine Russell, MSN, NP-C 05/08/2024, 3:56 PM [facility] [address], [address] [place], [place] [address] [phone]  "

## 2024-05-09 ENCOUNTER — Ambulatory Visit: Admitting: Adult Health

## 2024-05-09 ENCOUNTER — Encounter: Payer: Self-pay | Admitting: Adult Health

## 2024-05-09 VITALS — BP 115/82 | HR 92 | Ht <= 58 in | Wt 131.4 lb

## 2024-05-09 DIAGNOSIS — F5102 Adjustment insomnia: Secondary | ICD-10-CM | POA: Diagnosis not present

## 2024-05-09 DIAGNOSIS — F5105 Insomnia due to other mental disorder: Secondary | ICD-10-CM | POA: Diagnosis not present

## 2024-05-09 DIAGNOSIS — R400 Somnolence: Secondary | ICD-10-CM | POA: Diagnosis not present

## 2024-05-09 MED ORDER — AMPHETAMINE-DEXTROAMPHETAMINE 10 MG PO TABS: 15.0000 mg | ORAL_TABLET | Freq: Every day | ORAL | 0 refills | Status: AC | PRN

## 2024-05-09 MED ORDER — QUETIAPINE FUMARATE 25 MG PO TABS: 25.0000 mg | ORAL_TABLET | Freq: Every day | ORAL | 3 refills | Status: AC

## 2024-07-09 ENCOUNTER — Encounter: Payer: Self-pay | Admitting: Family Medicine

## 2024-11-06 ENCOUNTER — Ambulatory Visit: Admitting: Neurology

## 2025-01-30 ENCOUNTER — Ambulatory Visit: Admitting: Internal Medicine
